# Patient Record
Sex: Male | Born: 1945 | Race: White | Hispanic: No | Marital: Married | Smoking: Former smoker
Health system: Southern US, Community
[De-identification: ages and names within clinical notes are randomized; demographics above are authoritative.]

## PROBLEM LIST (undated history)

## (undated) DIAGNOSIS — C801 Malignant (primary) neoplasm, unspecified: Secondary | ICD-10-CM

## (undated) DIAGNOSIS — K635 Polyp of colon: Secondary | ICD-10-CM

## (undated) DIAGNOSIS — E785 Hyperlipidemia, unspecified: Secondary | ICD-10-CM

## (undated) DIAGNOSIS — F32A Depression, unspecified: Secondary | ICD-10-CM

## (undated) DIAGNOSIS — K5790 Diverticulosis of intestine, part unspecified, without perforation or abscess without bleeding: Secondary | ICD-10-CM

## (undated) DIAGNOSIS — F329 Major depressive disorder, single episode, unspecified: Secondary | ICD-10-CM

## (undated) DIAGNOSIS — G4730 Sleep apnea, unspecified: Secondary | ICD-10-CM

## (undated) DIAGNOSIS — R809 Proteinuria, unspecified: Secondary | ICD-10-CM

## (undated) DIAGNOSIS — R011 Cardiac murmur, unspecified: Secondary | ICD-10-CM

## (undated) DIAGNOSIS — I10 Essential (primary) hypertension: Secondary | ICD-10-CM

## (undated) HISTORY — DX: Major depressive disorder, single episode, unspecified: F32.9

## (undated) HISTORY — DX: Depression, unspecified: F32.A

## (undated) HISTORY — DX: Cardiac murmur, unspecified: R01.1

## (undated) HISTORY — DX: Polyp of colon: K63.5

## (undated) HISTORY — DX: Diverticulosis of intestine, part unspecified, without perforation or abscess without bleeding: K57.90

## (undated) HISTORY — DX: Hyperlipidemia, unspecified: E78.5

## (undated) HISTORY — DX: Essential (primary) hypertension: I10

## (undated) HISTORY — DX: Proteinuria, unspecified: R80.9

## (undated) HISTORY — PX: TONSILLECTOMY AND ADENOIDECTOMY: SUR1326

## (undated) HISTORY — DX: Sleep apnea, unspecified: G47.30

---

## 1997-10-23 ENCOUNTER — Ambulatory Visit (HOSPITAL_COMMUNITY): Admission: RE | Admit: 1997-10-23 | Discharge: 1997-10-23 | Payer: Self-pay | Admitting: *Deleted

## 1997-11-04 ENCOUNTER — Encounter: Admission: RE | Admit: 1997-11-04 | Discharge: 1998-02-02 | Payer: Self-pay | Admitting: Internal Medicine

## 2000-05-26 ENCOUNTER — Encounter: Admission: RE | Admit: 2000-05-26 | Discharge: 2000-05-26 | Payer: Self-pay | Admitting: Internal Medicine

## 2000-05-26 ENCOUNTER — Encounter: Payer: Self-pay | Admitting: Internal Medicine

## 2000-12-04 ENCOUNTER — Emergency Department (HOSPITAL_COMMUNITY): Admission: EM | Admit: 2000-12-04 | Discharge: 2000-12-04 | Payer: Self-pay | Admitting: Emergency Medicine

## 2002-01-16 ENCOUNTER — Encounter: Admission: RE | Admit: 2002-01-16 | Discharge: 2002-01-16 | Payer: Self-pay | Admitting: Internal Medicine

## 2002-01-16 ENCOUNTER — Encounter: Payer: Self-pay | Admitting: Internal Medicine

## 2002-01-29 ENCOUNTER — Encounter: Admission: RE | Admit: 2002-01-29 | Discharge: 2002-01-29 | Payer: Self-pay | Admitting: Internal Medicine

## 2002-01-29 ENCOUNTER — Encounter: Payer: Self-pay | Admitting: Internal Medicine

## 2003-07-29 ENCOUNTER — Encounter: Payer: Self-pay | Admitting: Internal Medicine

## 2003-10-27 ENCOUNTER — Ambulatory Visit (HOSPITAL_BASED_OUTPATIENT_CLINIC_OR_DEPARTMENT_OTHER): Admission: RE | Admit: 2003-10-27 | Discharge: 2003-10-27 | Payer: Self-pay | Admitting: Internal Medicine

## 2003-12-27 ENCOUNTER — Ambulatory Visit: Payer: Self-pay | Admitting: Internal Medicine

## 2004-03-20 ENCOUNTER — Ambulatory Visit: Payer: Self-pay | Admitting: Internal Medicine

## 2004-04-06 ENCOUNTER — Ambulatory Visit: Payer: Self-pay | Admitting: Internal Medicine

## 2004-08-06 ENCOUNTER — Ambulatory Visit: Payer: Self-pay | Admitting: Internal Medicine

## 2004-08-13 ENCOUNTER — Ambulatory Visit: Payer: Self-pay | Admitting: Internal Medicine

## 2004-10-13 ENCOUNTER — Ambulatory Visit: Payer: Self-pay | Admitting: Internal Medicine

## 2004-11-12 ENCOUNTER — Ambulatory Visit: Payer: Self-pay | Admitting: Internal Medicine

## 2004-11-19 ENCOUNTER — Ambulatory Visit: Payer: Self-pay | Admitting: Internal Medicine

## 2005-03-25 ENCOUNTER — Ambulatory Visit: Payer: Self-pay | Admitting: Internal Medicine

## 2005-04-01 ENCOUNTER — Ambulatory Visit: Payer: Self-pay | Admitting: Internal Medicine

## 2005-07-26 ENCOUNTER — Ambulatory Visit: Payer: Self-pay | Admitting: Internal Medicine

## 2005-08-17 ENCOUNTER — Ambulatory Visit: Payer: Self-pay | Admitting: Internal Medicine

## 2005-11-23 ENCOUNTER — Ambulatory Visit: Payer: Self-pay | Admitting: Internal Medicine

## 2005-11-23 LAB — CONVERTED CEMR LAB
ALT: 48 units/L — ABNORMAL HIGH (ref 0–40)
AST: 35 units/L (ref 0–37)
Albumin: 3.6 g/dL (ref 3.5–5.2)
Alkaline Phosphatase: 39 units/L (ref 39–117)
BUN: 19 mg/dL (ref 6–23)
Bilirubin, Direct: 0.2 mg/dL (ref 0.0–0.3)
CO2: 33 meq/L — ABNORMAL HIGH (ref 19–32)
Calcium: 9.2 mg/dL (ref 8.4–10.5)
Chloride: 105 meq/L (ref 96–112)
Chol/HDL Ratio, serum: 4.1
Cholesterol: 135 mg/dL (ref 0–200)
Creatinine, Ser: 1.3 mg/dL (ref 0.4–1.5)
GFR calc non Af Amer: 59 mL/min
Glomerular Filtration Rate, Af Am: 72 mL/min/{1.73_m2}
Glucose, Bld: 175 mg/dL — ABNORMAL HIGH (ref 70–99)
HDL: 33 mg/dL — ABNORMAL LOW (ref >39.0)
Hgb A1c MFr Bld: 8.6 % — ABNORMAL HIGH (ref 4.6–6.0)
LDL Cholesterol: 79 mg/dL (ref 0–99)
Potassium: 4.8 meq/L (ref 3.5–5.1)
Sodium: 144 meq/L (ref 135–145)
Total Bilirubin: 1 mg/dL (ref 0.3–1.2)
Total Protein: 6.3 g/dL (ref 6.0–8.3)
Triglyceride fasting, serum: 114 mg/dL (ref 0–149)
VLDL: 23 mg/dL (ref 0–40)

## 2005-11-30 ENCOUNTER — Ambulatory Visit: Payer: Self-pay | Admitting: Internal Medicine

## 2006-01-19 ENCOUNTER — Ambulatory Visit: Payer: Self-pay | Admitting: Endocrinology

## 2006-02-09 ENCOUNTER — Ambulatory Visit: Payer: Self-pay | Admitting: Endocrinology

## 2006-02-21 ENCOUNTER — Ambulatory Visit: Payer: Self-pay | Admitting: Endocrinology

## 2006-03-17 ENCOUNTER — Ambulatory Visit: Payer: Self-pay | Admitting: Endocrinology

## 2006-04-07 ENCOUNTER — Ambulatory Visit: Payer: Self-pay | Admitting: Endocrinology

## 2006-04-22 ENCOUNTER — Ambulatory Visit: Payer: Self-pay | Admitting: Internal Medicine

## 2006-04-22 LAB — CONVERTED CEMR LAB
ALT: 107 units/L — ABNORMAL HIGH (ref 0–40)
AST: 77 units/L — ABNORMAL HIGH (ref 0–37)
Albumin: 3.4 g/dL — ABNORMAL LOW (ref 3.5–5.2)
Alkaline Phosphatase: 82 units/L (ref 39–117)
BUN: 14 mg/dL (ref 6–23)
Bilirubin, Direct: 0.1 mg/dL (ref 0.0–0.3)
CO2: 35 meq/L — ABNORMAL HIGH (ref 19–32)
Calcium: 9.2 mg/dL (ref 8.4–10.5)
Chloride: 101 meq/L (ref 96–112)
Cholesterol: 143 mg/dL (ref 0–200)
Creatinine, Ser: 1.2 mg/dL (ref 0.4–1.5)
GFR calc Af Amer: 79 mL/min
GFR calc non Af Amer: 65 mL/min
Glucose, Bld: 300 mg/dL — ABNORMAL HIGH (ref 70–99)
HDL: 37.8 mg/dL — ABNORMAL LOW (ref >39.0)
Hgb A1c MFr Bld: 10.5 % — ABNORMAL HIGH (ref 4.6–6.0)
LDL Cholesterol: 74 mg/dL (ref 0–99)
Potassium: 3.8 meq/L (ref 3.5–5.1)
Sodium: 142 meq/L (ref 135–145)
Total Bilirubin: 0.9 mg/dL (ref 0.3–1.2)
Total CHOL/HDL Ratio: 3.8
Total Protein: 6.6 g/dL (ref 6.0–8.3)
Triglycerides: 156 mg/dL — ABNORMAL HIGH (ref 0–149)
VLDL: 31 mg/dL (ref 0–40)

## 2006-04-27 ENCOUNTER — Ambulatory Visit: Payer: Self-pay | Admitting: Internal Medicine

## 2006-05-30 ENCOUNTER — Ambulatory Visit: Payer: Self-pay | Admitting: Internal Medicine

## 2006-07-28 DIAGNOSIS — E1129 Type 2 diabetes mellitus with other diabetic kidney complication: Secondary | ICD-10-CM

## 2006-07-28 DIAGNOSIS — F325 Major depressive disorder, single episode, in full remission: Secondary | ICD-10-CM | POA: Insufficient documentation

## 2006-07-28 DIAGNOSIS — E785 Hyperlipidemia, unspecified: Secondary | ICD-10-CM | POA: Insufficient documentation

## 2006-07-28 DIAGNOSIS — R809 Proteinuria, unspecified: Secondary | ICD-10-CM | POA: Insufficient documentation

## 2006-07-28 DIAGNOSIS — I10 Essential (primary) hypertension: Secondary | ICD-10-CM

## 2006-08-23 ENCOUNTER — Ambulatory Visit: Payer: Self-pay | Admitting: Internal Medicine

## 2006-08-23 LAB — CONVERTED CEMR LAB
ALT: 48 units/L (ref 0–53)
AST: 34 units/L (ref 0–37)
Albumin: 3.5 g/dL (ref 3.5–5.2)
Alkaline Phosphatase: 47 units/L (ref 39–117)
BUN: 19 mg/dL (ref 6–23)
Bilirubin, Direct: 0.1 mg/dL (ref 0.0–0.3)
CO2: 33 meq/L — ABNORMAL HIGH (ref 19–32)
Calcium: 9.3 mg/dL (ref 8.4–10.5)
Chloride: 104 meq/L (ref 96–112)
Cholesterol: 121 mg/dL (ref 0–200)
Creatinine, Ser: 1.2 mg/dL (ref 0.4–1.5)
GFR calc Af Amer: 79 mL/min
GFR calc non Af Amer: 65 mL/min
Glucose, Bld: 151 mg/dL — ABNORMAL HIGH (ref 70–99)
HDL: 39.1 mg/dL (ref >39.0)
Hgb A1c MFr Bld: 7.7 % — ABNORMAL HIGH (ref 4.6–6.0)
LDL Cholesterol: 65 mg/dL (ref 0–99)
Potassium: 3.8 meq/L (ref 3.5–5.1)
Sodium: 142 meq/L (ref 135–145)
Total Bilirubin: 0.9 mg/dL (ref 0.3–1.2)
Total CHOL/HDL Ratio: 3.1
Total Protein: 6.5 g/dL (ref 6.0–8.3)
Triglycerides: 85 mg/dL (ref 0–149)
VLDL: 17 mg/dL (ref 0–40)

## 2006-08-30 ENCOUNTER — Ambulatory Visit: Payer: Self-pay | Admitting: Internal Medicine

## 2006-08-30 LAB — CONVERTED CEMR LAB
Cholesterol, target level: 200 mg/dL
HDL goal, serum: 40 mg/dL
LDL Goal: 100 mg/dL

## 2006-12-27 ENCOUNTER — Ambulatory Visit: Payer: Self-pay | Admitting: Internal Medicine

## 2006-12-27 DIAGNOSIS — D649 Anemia, unspecified: Secondary | ICD-10-CM

## 2006-12-27 LAB — CONVERTED CEMR LAB
ALT: 67 units/L — ABNORMAL HIGH (ref 0–53)
AST: 44 units/L — ABNORMAL HIGH (ref 0–37)
Albumin: 3.4 g/dL — ABNORMAL LOW (ref 3.5–5.2)
Alkaline Phosphatase: 52 units/L (ref 39–117)
BUN: 17 mg/dL (ref 6–23)
Basophils Absolute: 0 10*3/uL (ref 0.0–0.1)
Basophils Relative: 0.1 % (ref 0.0–1.0)
Bilirubin, Direct: 0.3 mg/dL (ref 0.0–0.3)
CO2: 34 meq/L — ABNORMAL HIGH (ref 19–32)
Calcium: 9.4 mg/dL (ref 8.4–10.5)
Chloride: 99 meq/L (ref 96–112)
Cholesterol: 137 mg/dL (ref 0–200)
Creatinine, Ser: 1.3 mg/dL (ref 0.4–1.5)
Creatinine,U: 11.9 mg/dL
Eosinophils Absolute: 0.2 10*3/uL (ref 0.0–0.6)
Eosinophils Relative: 2.3 % (ref 0.0–5.0)
GFR calc Af Amer: 72 mL/min
GFR calc non Af Amer: 59 mL/min
Glucose, Bld: 126 mg/dL — ABNORMAL HIGH (ref 70–99)
HCT: 42.4 % (ref 39.0–52.0)
HDL: 35.7 mg/dL — ABNORMAL LOW (ref >39.0)
Hemoglobin: 14.5 g/dL (ref 13.0–17.0)
Hgb A1c MFr Bld: 8.6 % — ABNORMAL HIGH (ref 4.6–6.0)
LDL Cholesterol: 80 mg/dL (ref 0–99)
Lymphocytes Relative: 19.2 % (ref 12.0–46.0)
MCHC: 34.1 g/dL (ref 30.0–36.0)
MCV: 91 fL (ref 78.0–100.0)
Microalb Creat Ratio: 58.8 mg/g — ABNORMAL HIGH (ref 0.0–30.0)
Microalb, Ur: 0.7 mg/dL (ref 0.0–1.9)
Monocytes Absolute: 0.9 10*3/uL — ABNORMAL HIGH (ref 0.2–0.7)
Monocytes Relative: 10.5 % (ref 3.0–11.0)
Neutro Abs: 5.6 10*3/uL (ref 1.4–7.7)
Neutrophils Relative %: 67.9 % (ref 43.0–77.0)
Platelets: 216 10*3/uL (ref 150–400)
Potassium: 3.7 meq/L (ref 3.5–5.1)
RBC: 4.66 M/uL (ref 4.22–5.81)
RDW: 12.9 % (ref 11.5–14.6)
Sodium: 141 meq/L (ref 135–145)
Total Bilirubin: 1.2 mg/dL (ref 0.3–1.2)
Total CHOL/HDL Ratio: 3.8
Total Protein: 6.6 g/dL (ref 6.0–8.3)
Triglycerides: 105 mg/dL (ref 0–149)
VLDL: 21 mg/dL (ref 0–40)
WBC: 8.3 10*3/uL (ref 4.5–10.5)

## 2007-01-04 ENCOUNTER — Ambulatory Visit: Payer: Self-pay | Admitting: Internal Medicine

## 2007-02-20 ENCOUNTER — Telehealth: Payer: Self-pay | Admitting: Internal Medicine

## 2007-02-21 ENCOUNTER — Ambulatory Visit: Payer: Self-pay | Admitting: Internal Medicine

## 2007-03-07 ENCOUNTER — Ambulatory Visit: Payer: Self-pay | Admitting: Internal Medicine

## 2007-03-09 ENCOUNTER — Encounter: Payer: Self-pay | Admitting: Internal Medicine

## 2007-03-16 ENCOUNTER — Encounter: Payer: Self-pay | Admitting: Internal Medicine

## 2007-03-16 ENCOUNTER — Ambulatory Visit: Payer: Self-pay

## 2007-03-29 ENCOUNTER — Telehealth: Payer: Self-pay | Admitting: Internal Medicine

## 2007-04-19 ENCOUNTER — Ambulatory Visit: Payer: Self-pay | Admitting: Internal Medicine

## 2007-04-19 LAB — CONVERTED CEMR LAB
ALT: 42 U/L
AST: 31 U/L
Albumin: 3.6 g/dL
Alkaline Phosphatase: 40 U/L
BUN: 19 mg/dL
Bilirubin, Direct: 0.2 mg/dL
CO2: 33 meq/L — ABNORMAL HIGH
Calcium: 9.4 mg/dL
Chloride: 101 meq/L
Cholesterol: 114 mg/dL
Creatinine, Ser: 1.3 mg/dL
GFR calc Af Amer: 72 mL/min
GFR calc non Af Amer: 59 mL/min
Glucose, Bld: 167 mg/dL — ABNORMAL HIGH
HDL: 29.8 mg/dL — ABNORMAL LOW
Hgb A1c MFr Bld: 8.8 % — ABNORMAL HIGH
LDL Cholesterol: 64 mg/dL
Potassium: 4.1 meq/L
Sodium: 140 meq/L
Total Bilirubin: 1.1 mg/dL
Total CHOL/HDL Ratio: 3.8
Total Protein: 6.4 g/dL
Triglycerides: 100 mg/dL
VLDL: 20 mg/dL

## 2007-05-05 ENCOUNTER — Ambulatory Visit: Payer: Self-pay | Admitting: Internal Medicine

## 2007-06-16 ENCOUNTER — Encounter: Payer: Self-pay | Admitting: Internal Medicine

## 2007-06-19 ENCOUNTER — Telehealth: Payer: Self-pay | Admitting: Internal Medicine

## 2007-06-28 ENCOUNTER — Ambulatory Visit: Payer: Self-pay | Admitting: Internal Medicine

## 2007-06-28 DIAGNOSIS — M109 Gout, unspecified: Secondary | ICD-10-CM

## 2007-07-20 ENCOUNTER — Telehealth: Payer: Self-pay | Admitting: Internal Medicine

## 2007-08-30 ENCOUNTER — Ambulatory Visit: Payer: Self-pay | Admitting: Internal Medicine

## 2007-08-30 LAB — CONVERTED CEMR LAB
ALT: 50 units/L (ref 0–53)
AST: 50 units/L — ABNORMAL HIGH (ref 0–37)
Bilirubin, Direct: 0.2 mg/dL (ref 0.0–0.3)
CO2: 30 meq/L (ref 19–32)
Calcium: 9.3 mg/dL (ref 8.4–10.5)
Chloride: 104 meq/L (ref 96–112)
Creatinine, Ser: 1.3 mg/dL (ref 0.4–1.5)
Glucose, Bld: 119 mg/dL — ABNORMAL HIGH (ref 70–99)
HDL: 34.5 mg/dL — ABNORMAL LOW (ref >39.0)
Total Bilirubin: 1.2 mg/dL (ref 0.3–1.2)
Total CHOL/HDL Ratio: 3.4
Total Protein: 6.6 g/dL (ref 6.0–8.3)
Triglycerides: 95 mg/dL (ref 0–149)

## 2007-09-06 ENCOUNTER — Ambulatory Visit: Payer: Self-pay | Admitting: Internal Medicine

## 2007-10-16 ENCOUNTER — Telehealth: Payer: Self-pay | Admitting: Internal Medicine

## 2007-12-22 ENCOUNTER — Ambulatory Visit: Payer: Self-pay | Admitting: Internal Medicine

## 2007-12-22 LAB — CONVERTED CEMR LAB
AST: 31 units/L (ref 0–37)
Alkaline Phosphatase: 52 units/L (ref 39–117)
Bilirubin, Direct: 0.1 mg/dL (ref 0.0–0.3)
Chloride: 101 meq/L (ref 96–112)
GFR calc Af Amer: 78 mL/min
GFR calc non Af Amer: 65 mL/min
LDL Cholesterol: 68 mg/dL (ref 0–99)
Potassium: 3.6 meq/L (ref 3.5–5.1)
Sodium: 141 meq/L (ref 135–145)
Total Bilirubin: 1.1 mg/dL (ref 0.3–1.2)
VLDL: 21 mg/dL (ref 0–40)

## 2007-12-24 ENCOUNTER — Emergency Department (HOSPITAL_COMMUNITY): Admission: EM | Admit: 2007-12-24 | Discharge: 2007-12-24 | Payer: Self-pay | Admitting: Emergency Medicine

## 2007-12-28 ENCOUNTER — Ambulatory Visit: Payer: Self-pay | Admitting: Internal Medicine

## 2008-02-26 ENCOUNTER — Encounter: Payer: Self-pay | Admitting: Internal Medicine

## 2008-04-08 ENCOUNTER — Encounter: Payer: Self-pay | Admitting: Internal Medicine

## 2008-04-30 ENCOUNTER — Ambulatory Visit: Payer: Self-pay | Admitting: Internal Medicine

## 2008-04-30 LAB — CONVERTED CEMR LAB
ALT: 60 units/L — ABNORMAL HIGH (ref 0–53)
Alkaline Phosphatase: 43 units/L (ref 39–117)
BUN: 16 mg/dL (ref 6–23)
Bilirubin, Direct: 0.1 mg/dL (ref 0.0–0.3)
Creatinine, Ser: 1.3 mg/dL (ref 0.4–1.5)
GFR calc non Af Amer: 58.93 mL/min (ref >60)
Glucose, Bld: 299 mg/dL — ABNORMAL HIGH (ref 70–99)
HDL: 41.4 mg/dL (ref >39.00)
Hgb A1c MFr Bld: 11.6 % — ABNORMAL HIGH (ref 4.6–6.5)
LDL Cholesterol: 67 mg/dL (ref 0–99)
Total CHOL/HDL Ratio: 3
Total Protein: 6.7 g/dL (ref 6.0–8.3)
Triglycerides: 159 mg/dL — ABNORMAL HIGH (ref 0.0–149.0)
VLDL: 31.8 mg/dL (ref 0.0–40.0)

## 2008-05-07 ENCOUNTER — Ambulatory Visit: Payer: Self-pay | Admitting: Internal Medicine

## 2008-09-05 ENCOUNTER — Ambulatory Visit: Payer: Self-pay | Admitting: Internal Medicine

## 2008-09-05 LAB — CONVERTED CEMR LAB
ALT: 55 units/L — ABNORMAL HIGH (ref 0–53)
AST: 37 units/L (ref 0–37)
BUN: 16 mg/dL (ref 6–23)
Bilirubin, Direct: 0.1 mg/dL (ref 0.0–0.3)
CO2: 33 meq/L — ABNORMAL HIGH (ref 19–32)
GFR calc non Af Amer: 58.87 mL/min (ref >60)
Glucose, Bld: 348 mg/dL — ABNORMAL HIGH (ref 70–99)
Potassium: 3.8 meq/L (ref 3.5–5.1)
Total Bilirubin: 1 mg/dL (ref 0.3–1.2)
Total CHOL/HDL Ratio: 3
VLDL: 22.8 mg/dL (ref 0.0–40.0)

## 2008-09-11 ENCOUNTER — Ambulatory Visit: Payer: Self-pay | Admitting: Internal Medicine

## 2008-09-17 ENCOUNTER — Encounter: Payer: Self-pay | Admitting: Internal Medicine

## 2009-01-01 ENCOUNTER — Ambulatory Visit: Payer: Self-pay | Admitting: Internal Medicine

## 2009-01-01 LAB — CONVERTED CEMR LAB
Alkaline Phosphatase: 49 units/L (ref 39–117)
Bilirubin, Direct: 0 mg/dL (ref 0.0–0.3)
CO2: 32 meq/L (ref 19–32)
Calcium: 9.7 mg/dL (ref 8.4–10.5)
Creatinine, Ser: 1.3 mg/dL (ref 0.4–1.5)
GFR calc non Af Amer: 58.81 mL/min (ref >60)
HDL: 39.2 mg/dL (ref >39.00)
LDL Cholesterol: 64 mg/dL (ref 0–99)
Sodium: 139 meq/L (ref 135–145)
Total Bilirubin: 1.2 mg/dL (ref 0.3–1.2)
Total CHOL/HDL Ratio: 3
Triglycerides: 163 mg/dL — ABNORMAL HIGH (ref 0.0–149.0)

## 2009-01-08 ENCOUNTER — Ambulatory Visit: Payer: Self-pay | Admitting: Internal Medicine

## 2009-01-08 LAB — HM DIABETES FOOT EXAM

## 2009-03-07 ENCOUNTER — Telehealth: Payer: Self-pay | Admitting: Internal Medicine

## 2009-03-27 ENCOUNTER — Ambulatory Visit: Payer: Self-pay | Admitting: Internal Medicine

## 2009-04-09 ENCOUNTER — Telehealth: Payer: Self-pay | Admitting: *Deleted

## 2009-04-23 ENCOUNTER — Encounter: Payer: Self-pay | Admitting: Internal Medicine

## 2009-04-24 ENCOUNTER — Ambulatory Visit: Payer: Self-pay | Admitting: Internal Medicine

## 2009-04-24 LAB — CONVERTED CEMR LAB
Albumin: 3.8 g/dL (ref 3.5–5.2)
Alkaline Phosphatase: 50 units/L (ref 39–117)
CO2: 34 meq/L — ABNORMAL HIGH (ref 19–32)
Chloride: 102 meq/L (ref 96–112)
Cholesterol: 135 mg/dL (ref 0–200)
Creatinine, Ser: 1.4 mg/dL (ref 0.4–1.5)
HDL: 43.6 mg/dL (ref >39.00)
Hgb A1c MFr Bld: 9.5 % — ABNORMAL HIGH (ref 4.6–6.5)
LDL Cholesterol: 62 mg/dL (ref 0–99)
Sodium: 144 meq/L (ref 135–145)
Total CHOL/HDL Ratio: 3
Total Protein: 7.1 g/dL (ref 6.0–8.3)
Triglycerides: 146 mg/dL (ref 0.0–149.0)
VLDL: 29.2 mg/dL (ref 0.0–40.0)

## 2009-05-02 ENCOUNTER — Ambulatory Visit: Payer: Self-pay | Admitting: Internal Medicine

## 2009-08-29 ENCOUNTER — Ambulatory Visit: Payer: Self-pay | Admitting: Internal Medicine

## 2009-08-29 LAB — CONVERTED CEMR LAB
ALT: 47 units/L (ref 0–53)
Albumin: 3.7 g/dL (ref 3.5–5.2)
Alkaline Phosphatase: 53 units/L (ref 39–117)
BUN: 18 mg/dL (ref 6–23)
Bilirubin, Direct: 0.2 mg/dL (ref 0.0–0.3)
Cholesterol: 126 mg/dL (ref 0–200)
Creatinine, Ser: 1.1 mg/dL (ref 0.4–1.5)
GFR calc non Af Amer: 69.71 mL/min (ref >60)
Hgb A1c MFr Bld: 10.6 % — ABNORMAL HIGH (ref 4.6–6.5)
LDL Cholesterol: 66 mg/dL (ref 0–99)
Potassium: 3.4 meq/L — ABNORMAL LOW (ref 3.5–5.1)
Total Protein: 6.3 g/dL (ref 6.0–8.3)
Triglycerides: 128 mg/dL (ref 0.0–149.0)
VLDL: 25.6 mg/dL (ref 0.0–40.0)

## 2009-09-04 ENCOUNTER — Ambulatory Visit: Payer: Self-pay | Admitting: Internal Medicine

## 2009-09-04 DIAGNOSIS — N529 Male erectile dysfunction, unspecified: Secondary | ICD-10-CM

## 2009-09-05 ENCOUNTER — Telehealth: Payer: Self-pay | Admitting: Internal Medicine

## 2009-09-25 ENCOUNTER — Encounter (INDEPENDENT_AMBULATORY_CARE_PROVIDER_SITE_OTHER): Payer: Self-pay | Admitting: *Deleted

## 2009-10-08 ENCOUNTER — Telehealth: Payer: Self-pay | Admitting: Internal Medicine

## 2009-10-15 ENCOUNTER — Telehealth: Payer: Self-pay | Admitting: Internal Medicine

## 2009-10-16 ENCOUNTER — Encounter: Payer: Self-pay | Admitting: Internal Medicine

## 2009-11-13 ENCOUNTER — Encounter: Payer: Self-pay | Admitting: Internal Medicine

## 2009-11-21 ENCOUNTER — Encounter (INDEPENDENT_AMBULATORY_CARE_PROVIDER_SITE_OTHER): Payer: Self-pay | Admitting: *Deleted

## 2009-11-26 ENCOUNTER — Ambulatory Visit: Payer: Self-pay | Admitting: Gastroenterology

## 2009-12-08 ENCOUNTER — Telehealth (INDEPENDENT_AMBULATORY_CARE_PROVIDER_SITE_OTHER): Payer: Self-pay | Admitting: *Deleted

## 2009-12-09 ENCOUNTER — Ambulatory Visit: Payer: Self-pay | Admitting: Gastroenterology

## 2009-12-09 DIAGNOSIS — K635 Polyp of colon: Secondary | ICD-10-CM

## 2009-12-09 DIAGNOSIS — K5790 Diverticulosis of intestine, part unspecified, without perforation or abscess without bleeding: Secondary | ICD-10-CM

## 2009-12-09 HISTORY — DX: Polyp of colon: K63.5

## 2009-12-09 HISTORY — DX: Diverticulosis of intestine, part unspecified, without perforation or abscess without bleeding: K57.90

## 2009-12-15 ENCOUNTER — Encounter: Payer: Self-pay | Admitting: Gastroenterology

## 2009-12-18 LAB — HM DIABETES EYE EXAM: HM Diabetic Eye Exam: NORMAL

## 2009-12-30 ENCOUNTER — Ambulatory Visit: Payer: Self-pay | Admitting: Internal Medicine

## 2009-12-30 LAB — CONVERTED CEMR LAB
AST: 37 units/L (ref 0–37)
Albumin: 3.8 g/dL (ref 3.5–5.2)
Alkaline Phosphatase: 55 units/L (ref 39–117)
CO2: 33 meq/L — ABNORMAL HIGH (ref 19–32)
Calcium: 9.5 mg/dL (ref 8.4–10.5)
Cholesterol: 145 mg/dL (ref 0–200)
Creatinine, Ser: 1.2 mg/dL (ref 0.4–1.5)
Total CHOL/HDL Ratio: 4
Triglycerides: 140 mg/dL (ref 0.0–149.0)

## 2010-01-05 ENCOUNTER — Ambulatory Visit: Payer: Self-pay | Admitting: Internal Medicine

## 2010-01-22 ENCOUNTER — Ambulatory Visit
Admission: RE | Admit: 2010-01-22 | Discharge: 2010-01-22 | Payer: Self-pay | Source: Home / Self Care | Attending: Family Medicine | Admitting: Family Medicine

## 2010-02-17 NOTE — Progress Notes (Signed)
Summary: Refill--Levemir  Phone Note Refill Request Message from:  Fax from Pharmacy on October 15, 2009 1:16 PM  Refills Requested: Medication #1:  LEVEMIR FLEXPEN 100 UNIT/ML SOLN 55 units subcutaneously two times a day [facility]--Will fax new prescription with current dosage  Initial call taken by: Lucious Groves CMA,  October 15, 2009 1:16 PM    Prescriptions: LEVEMIR FLEXPEN 100 UNIT/ML SOLN (INSULIN DETEMIR) 55 units subcutaneously two times a day  #3 mo. supply x 2   Entered by:   Lucious Groves CMA   Authorized by:   Birdie Sons MD   Signed by:   Lucious Groves CMA on 10/15/2009   Method used:   Faxed to ...       [facility] ([facility]       [address] [place], [place]  [address]       Ph: [phone]       Fax: [phone]   RxID:   [id number]

## 2010-02-17 NOTE — Assessment & Plan Note (Signed)
Summary: 4 month fup//ccm/pt rescd from bump//ccm   Vital Signs:  Patient profile:   65 year old male Weight:      208 pounds BMI:     36.40 Temp:     98 degrees F oral Pulse rate:   64 / minute Pulse rhythm:   regular Resp:     12 per minute BP sitting:   108 / 76  (left arm) Cuff size:   regular  Vitals Entered By: Gladis Riffle, RN (September 04, 2009 8:24 AM) CC: 4 month rov, labs done--CBGs 165-185 at home Is Patient Diabetic? Yes Did you bring your meter with you today? No   CC:  4 month rov and labs done--CBGs 165-185 at home.  History of Present Illness:  Follow-Up Visit      This is a 65 year old man who presents for Follow-up visit.  The patient denies chest pain and palpitations.  Since the last visit the patient notes no new problems or concerns except has noted lack of ejaculation over past 3 months---erections are adequate and he is able to reach orgasm.  The patient reports taking meds as prescribed.  When questioned about possible medication side effects, the patient notes none.   not following a diet or exercise plan All other systems reviewed and were negative   Preventive Screening-Counseling & Management  Alcohol-Tobacco     Smoking Status: quit     Year Quit: 2000  Current Problems (verified): 1)  Edema  (ICD-782.3) 2)  Obesity  (ICD-278.00) 3)  Gout  (ICD-274.9) 4)  Anemia  (ICD-285.9) 5)  Advef, Drug/medicinal/biological Subst Nos  (ICD-995.20) 6)  Microalbuminuria  (ICD-791.0) 7)  Hypertension  (ICD-401.9) 8)  Hyperlipidemia  (ICD-272.4) 9)  Diabetes Mellitus, Type II  (ICD-250.00) 10)  Depression  (ICD-311)  Current Medications (verified): 1)  Bd U/f Iii Short Pen Needle 31g X 8 Mm Misc (Insulin Pen Needle) .... Use Once Daily 2)  Bumetanide 2 Mg Tabs (Bumetanide) .... One Daily 3)  Labetalol Hcl 200 Mg  Tabs (Labetalol Hcl) .... 2 By Mouth Two Times A Day 4)  Levemir Flexpen 100 Unit/ml Soln (Insulin Detemir) .... 50 Units Subcutaneously Two  Times A Day 5)  Lipitor 20 Mg Tabs (Atorvastatin Calcium) .... Take 1 Tablet By Mouth Once A Day 6)  Metformin Hcl 850 Mg Tabs (Metformin Hcl) .... Take 1 Tablet By Mouth Twice A Day 7)  Paxil 20 Mg  Tabs (Paroxetine Hcl) .... One Q Day 8)  Freestyle Lite Test  Strp (Glucose Blood) .... Use Once Daily As Directed 9)  Freestyle Lancets   Misc (Lancets) .... Use Once Daily 10)  Ramipril 10 Mg  Caps (Ramipril) .... One By Mouth Daily 11)  Benicar Hct 40-12.5 Mg Tabs (Olmesartan Medoxomil-Hctz) .... One By Mouth Daily 12)  Amlodipine Besylate 10 Mg  Tabs (Amlodipine Besylate) .... 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   Head:  normocephalic and atraumatic.   Eyes:  pupils equal and pupils round.   Ears:  R ear normal and L ear normal.   Neck:  No deformities, masses, or tenderness noted. Chest Wall:  No deformities, masses, tenderness or gynecomastia noted. Lungs:  normal respiratory effort and no accessory muscle use.   Heart:  normal rate and regular rhythm.   Abdomen:  Bowel sounds positive,abdomen soft and non-tender without masses, organomegaly or hernias noted. Msk:  No deformity or scoliosis noted of thoracic or lumbar spine.   Neurologic:  cranial nerves II-XII intact and gait normal.   Skin:  turgor  normal and color normal.   Psych:  normally interactive and good eye contact.     Impression & Recommendations:  Problem # 1:  DIABETES MELLITUS, TYPE II (ICD-250.00) not controlled continue current medications except increase levemir he will monitor Blood sugars at various times of day His updated medication list for this problem includes:    Levemir Flexpen 100 Unit/ml Soln (Insulin detemir) .Marland KitchenMarland KitchenMarland KitchenMarland Kitchen 55 units subcutaneously two times a day    Metformin Hcl 850 Mg Tabs (Metformin hcl) .Marland Kitchen... Take 1 tablet by mouth twice a day    Ramipril 10 Mg Caps (Ramipril) ..... One by mouth daily    Benicar Hct 40-12.5 Mg Tabs (Olmesartan medoxomil-hctz) ..... One by mouth daily  Labs Reviewed: Creat: 1.1 (08/29/2009)     Last Eye Exam: normal-pt's report (12/18/2008) Reviewed HgBA1c results: 10.6 (08/29/2009)  9.5 (04/24/2009)  Problem # 2:  OBESITY (ICD-278.00) clearly his main problem needs to lose weight  Problem # 3:  HYPERLIPIDEMIA (ICD-272.4) controlled continue current medications  His updated medication list for this problem includes:    Lipitor 20 Mg Tabs (Atorvastatin calcium) .Marland Kitchen... Take 1 tablet by mouth once a day  Labs Reviewed: SGOT: 33 (08/29/2009)   SGPT: 47 (08/29/2009)  Lipid Goals: Chol Goal: 200 (08/30/2006)   HDL Goal: 40 (08/30/2006)   LDL Goal: 100 (08/30/2006)   TG Goal: 150 (08/30/2006)  Prior 10 Yr Risk Heart Disease: 11 % (03/07/2007)   HDL:34.10 (08/29/2009), 43.60 (04/24/2009)  LDL:66 (08/29/2009), 62 (04/24/2009)  Chol:126 (08/29/2009), 135 (04/24/2009)  Trig:128.0 (08/29/2009), 146.0 (04/24/2009)  Problem # 4:  EDEMA (ICD-782.3) resolved His updated medication list for this problem includes:    Bumetanide 2 Mg Tabs (Bumetanide) ..... One daily    Benicar Hct 40-12.5 Mg Tabs (Olmesartan medoxomil-hctz) ..... One by mouth daily  Problem # 5:  HYPERTENSION (ICD-401.9)  note ACE-I and ARB  BP well controlled continue current medications  His updated  medication list for this problem includes:    Bumetanide 2 Mg Tabs (Bumetanide) ..... One daily    Labetalol Hcl 200 Mg Tabs (Labetalol hcl) .Marland Kitchen... 2 by mouth two times a day    Ramipril 10 Mg Caps (Ramipril) ..... One by mouth daily    Benicar Hct 40-12.5 Mg Tabs (Olmesartan medoxomil-hctz) ..... One by mouth daily    Amlodipine Besylate 10 Mg Tabs (Amlodipine besylate) ..... 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 Virginia Rochester  Complete Medication List: 1)  Bd U/f Iii Short Pen Needle 31g X 8 Mm Misc (Insulin pen needle) .Marland KitchenMarland KitchenMarland Kitchen  Use once daily 2)  Bumetanide 2 Mg Tabs (Bumetanide) .... One daily 3)  Labetalol Hcl 200 Mg Tabs (Labetalol hcl) .... 2 by mouth two times a day 4)  Levemir Flexpen 100 Unit/ml Soln (Insulin detemir) .... 55 units subcutaneously two times a day 5)  Lipitor 20 Mg Tabs (Atorvastatin calcium) .... Take 1 tablet by mouth once a day 6)  Metformin Hcl 850 Mg Tabs (Metformin hcl) .... Take 1 tablet by mouth twice a day 7)  Paxil 20 Mg Tabs (Paroxetine hcl) .... One q day 8)  Freestyle Lite Test Strp (Glucose blood) .... Use once daily as directed 9)  Freestyle Lancets Misc (Lancets) .... Use once daily 10)  Ramipril 10 Mg Caps (Ramipril) .... One by mouth daily 11)  Benicar Hct 40-12.5 Mg Tabs (Olmesartan medoxomil-hctz) .... One by mouth daily 12)  Amlodipine Besylate 10 Mg Tabs (Amlodipine besylate) .... Once daily   Patient Instructions: 1)  Please schedule a follow-up appointment in 4 months. 2)  labs one week prior to visit 3)   lipids---272.4 4)  lfts-995.2 5)  bmet-995.2 6)  A1C-250.02 7)

## 2010-02-17 NOTE — Progress Notes (Signed)
Summary: Refill Labetalol  Phone Note From Pharmacy   Reason for Call: Medication not on formulary    Prescriptions: LABETALOL HCL 200 MG  TABS (LABETALOL HCL) 2 by mouth two times a day  #200 x 1   Entered by:   Kathrynn Speed CMA   Authorized by:   Birdie Sons MD   Signed by:   Kathrynn Speed CMA on 10/08/2009   Method used:   Faxed to ...       [facility])       [address] [place], [place]  [address]       Ph: [phone]       Fax: [phone]   RxID:   [id number]

## 2010-02-17 NOTE — Letter (Signed)
Summary: Diabetic Instructions  [facility]  [address] [place], [place] [address]   Phone: [phone]  Fax: [phone]    Paul [name] 11/15/1945 MRN: [id number]   _xx _   ORAL DIABETIC MEDICATION INSTRUCTIONS  The day before your procedure:   Take your diabetic pill as you do normally  The day of your procedure:   Do not take your diabetic pill    We will check your blood sugar levels during the admission process and again in Recovery before discharging you home  ________________________________________________________________________  _x  _   INSULIN (LONG ACTING) MEDICATION INSTRUCTIONS (Lantus, NPH, 70/30, Humulin, Novolin-N)   The day before your procedure:   Take  your regular evening dose    The day of your procedure:   Do not take your morning dose

## 2010-02-17 NOTE — Progress Notes (Signed)
Summary: Pt checking on status of Benicar refill from [facility]  Phone Note Call from Patient Call back at Work Phone [phone]   Caller: Patient Summary of Call: Pt says that [facility] sent a req to get Benicar renewed. Pt is checking on status. Initial call taken by: Lucy Antigua,  March 07, 2009 9:35 AM  Follow-up for Phone Call        [facility] Follow-up by: Kern Reap CMA Duncan Dull),  March 07, 2009 9:49 AM  Additional Follow-up for Phone Call Additional follow up Details #1::        I called pt and notified him that refill has been faxed to [facility], as noted above.  Additional Follow-up by: Lucy Antigua,  March 07, 2009 10:15 AM    Prescriptions: BENICAR HCT 40-12.5 MG TABS (OLMESARTAN MEDOXOMIL-HCTZ) one by mouth daily  #90 x 3   Entered by:   Kern Reap CMA (AAMA)   Authorized by:   Birdie Sons MD   Signed by:   Kern Reap CMA (AAMA) on 03/07/2009   Method used:   Faxed to ...       [facility] ([facility]       [address] [place], [place]  [address]       Ph: [phone]       Fax: [phone]   RxID:   [id number]

## 2010-02-17 NOTE — Miscellaneous (Signed)
Summary: Orders Update   Clinical Lists Changes  Problems: Added new problem of HEALTH SCREENING (ICD-V70.0) Orders: Added new Referral order of Gastroenterology Referral (GI) - Signed

## 2010-02-17 NOTE — Progress Notes (Signed)
Summary: Pt called and is req to get a colonoscopy done  Phone Note Call from Patient   Caller: Patient Summary of Call: Pt called and is req to a colonoscopy done. Pls call pt at work # [phone], with referral info.   Initial call taken by: Lucy Antigua,  September 05, 2009 4:17 PM  Follow-up for Phone Call        order in process since 09/04/09.  GI to set up. Follow-up by: Gladis Riffle, RN,  September 08, 2009 9:09 AM  Additional Follow-up for Phone Call Additional follow up Details #1::        Patient notified. Will await when and where of appt. Additional Follow-up by: Gladis Riffle, RN,  September 08, 2009 2:50 PM

## 2010-02-17 NOTE — Procedures (Signed)
Summary: Colonoscopy  Patient: Paul [name] Note: All result statuses are Final unless otherwise noted.  Tests: (1) Colonoscopy (COL)   COL Colonoscopy           DONE     [facility]     [address]     [place], [place]  [address]           COLONOSCOPY PROCEDURE REPORT           PATIENT:  Paul [name], Paul [name]  MR#:  [id number]     BIRTHDATE:  Feb 25, 1945, 64 yrs. old  GENDER:  male           ENDOSCOPIST:  Barbette Hair. Arlyce Dice, MD     Referred by:  Birdie Sons, M.D.           PROCEDURE DATE:  12/09/2009     PROCEDURE:  Colonoscopy with snare polypectomy, Colon with cold     biopsy polypectomy     ASA CLASS:  Class II     INDICATIONS:  1) Routine Risk Screening           MEDICATIONS:   Fentanyl 50 mcg IV, Versed 5 mg IV           DESCRIPTION OF PROCEDURE:   After the risks benefits and     alternatives of the procedure were thoroughly explained, informed     consent was obtained.  Digital rectal exam was performed and     revealed no abnormalities.   The [id number] endoscope was     introduced through the anus and advanced to the cecum, which was     identified by both the appendix and ileocecal valve, without     limitations.  The quality of the prep was excellent, using     MoviPrep.  The instrument was then slowly withdrawn as the colon     was fully examined.     <<PROCEDUREIMAGES>>           FINDINGS:  There were multiple polyps identified and removed. in     the descending colon (see image12 and image11). 4 2-68mm sessile     polyps mid-descending colon - removed with cold bx forcepts (2) or     cold polypectomy snare (2)  Moderate diverticulosis was found in     the sigmoid colon (see image2).  This was otherwise a normal     examination of the colon (see image3, image6, image8, image16, and     image17).   Retroflexed views in the rectum revealed no     abnormalities.    The time to cecum =  2.0  minutes. The scope was     then withdrawn (time =  12.25  min)  from the patient and the     procedure completed.           COMPLICATIONS:  None           ENDOSCOPIC IMPRESSION:     1) Polyps, multiple in the descending colon     2) Moderate diverticulosis in the sigmoid colon     3) Otherwise normal examination     RECOMMENDATIONS:     1) If the polyp(s) removed today are proven to be adenomatous     (pre-cancerous) polyps, you will need a repeat colonoscopy in 5     years. Otherwise you should continue to follow colorectal cancer     screening guidelines for "routine risk" patients with colonoscopy     in  10 years.           REPEAT EXAM:   You will receive a letter from Dr. Arlyce Dice in 1-2     weeks, after reviewing the final pathology, with followup     recommendations.           ______________________________     Barbette Hair Arlyce Dice, MD           CC:           n.     eSIGNED:   Barbette Hair. Ambert Virrueta at 12/09/2009 11:50 AM           Al Pimple, [id number]  Note: An exclamation mark (!) indicates a result that was not dispersed into the flowsheet. Document Creation Date: 12/09/2009 11:50 AM _______________________________________________________________________  (1) Order result status: Final Collection or observation date-time: 12/09/2009 11:39 Requested date-time:  Receipt date-time:  Reported date-time:  Referring Physician:   Ordering Physician: Melvia Heaps [phone]) Specimen Source:  Source: Launa Grill Order Number: [id number] Lab site:   Appended Document: Colonoscopy    Clinical Lists Changes  Observations: Added new observation of COLONNXTDUE: 11/19/2014 (12/15/2009 11:01)

## 2010-02-17 NOTE — Letter (Signed)
Summary: [facility] Instructions  [facility]  [address] [place], [place] [address]   Phone: [phone]  Fax: [phone]       Paul [name]    09-04-1945    MRN: [id number]        Procedure Day /Date: Tuesday 12-09-09     Arrival Time: 9:30 a.m.     Procedure Time: 10:30 a.m.     Location of Procedure:                    _x _  [facility] (4th Floor)   PREPARATION FOR COLONOSCOPY WITH MOVIPREP   Starting 5 days prior to your procedure  12-04-09  do not eat nuts, seeds, popcorn, corn, beans, peas,  salads, or any raw vegetables.  Do not take any fiber supplements (e.g. Metamucil, Citrucel, and Benefiber).  THE DAY BEFORE YOUR PROCEDURE         DATE:  12-08-09  DAY:  Monday  1.  Drink clear liquids the entire day-NO SOLID FOOD  2.  Do not drink anything colored red or purple.  Avoid juices with pulp.  No orange juice.  3.  Drink at least 64 oz. (8 glasses) of fluid/clear liquids during the day to prevent dehydration and help the prep work efficiently.  CLEAR LIQUIDS INCLUDE: Water Jello Ice Popsicles Tea (sugar ok, no milk/cream) Powdered fruit flavored drinks Coffee (sugar ok, no milk/cream) Gatorade Juice: apple, white grape, white cranberry  Lemonade Clear bullion, consomm, broth Carbonated beverages (any kind) Strained chicken noodle soup Hard Candy                             4.  In the morning, mix first dose of MoviPrep solution:    Empty 1 Pouch A and 1 Pouch B into the disposable container    Add lukewarm drinking water to the top line of the container. Mix to dissolve    Refrigerate (mixed solution should be used within 24 hrs)  5.  Begin drinking the prep at 5:00 p.m. The MoviPrep container is divided by 4 marks.   Every 15 minutes drink the solution down to the next mark (approximately 8 oz) until the full liter is complete.   6.  Follow completed prep with 16 oz of clear liquid of your choice (Nothing red or  purple).  Continue to drink clear liquids until bedtime.  7.  Before going to bed, mix second dose of MoviPrep solution:    Empty 1 Pouch A and 1 Pouch B into the disposable container    Add lukewarm drinking water to the top line of the container. Mix to dissolve    Refrigerate  THE DAY OF YOUR PROCEDURE      DATE:  12-09-09  DAY:  Tuesday  Beginning at  5:30 a.m. (5 hours before procedure):         1. Every 15 minutes, drink the solution down to the next mark (approx 8 oz) until the full liter is complete.  2. Follow completed prep with 16 oz. of clear liquid of your choice.    3. You may drink clear liquids until  8:30 a.m. (2 HOURS BEFORE PROCEDURE).   MEDICATION INSTRUCTIONS  Unless otherwise instructed, you should take regular prescription medications with a small sip of water   as early as possible the morning of your procedure.  Additional medication instructions: Hold Benicar and Bumetinide the morning  of procedure.  Take other prescription medicine that morning.         OTHER INSTRUCTIONS  You will need a responsible adult at least 67 years of age to accompany you and drive you home.   This person must remain in the waiting room during your procedure.  Wear loose fitting clothing that is easily removed.  Leave jewelry and other valuables at home.  However, you may wish to bring a book to read or  an iPod/MP3 player to listen to music as you wait for your procedure to start.  Remove all body piercing jewelry and leave at home.  Total time from sign-in until discharge is approximately 2-3 hours.  You should go home directly after your procedure and rest.  You can resume normal activities the  day after your procedure.  The day of your procedure you should not:   Drive   Make legal decisions   Operate machinery   Drink alcohol   Return to work  You will receive specific instructions about eating, activities and medications before you  leave.    The above instructions have been reviewed and explained to me by   Wyona Almas RN  November 26, 2009 8:32 AM     I fully understand and can verbalize these instructions _____________________________ Date _________

## 2010-02-17 NOTE — Progress Notes (Signed)
Summary: REFILL  Phone Note Refill Request Message from:  Fax from Pharmacy  Refills Requested: Medication #1:  LIPITOR 20 MG TABS Take 1 tablet by mouth once a day [facility]---[phone]  Initial call taken by: Warnell Forester,  April 09, 2009 9:09 AM    Prescriptions: LIPITOR 20 MG TABS (ATORVASTATIN CALCIUM) Take 1 tablet by mouth once a day  #90 x 3   Entered by:   Kern Reap CMA (AAMA)   Authorized by:   Birdie Sons MD   Signed by:   Kern Reap CMA (AAMA) on 04/09/2009   Method used:   Faxed to ...       [facility] (mail[facility])       [address] [place], [place]  [address]       Ph: [phone]       Fax: [phone]   RxID:   [id number]

## 2010-02-17 NOTE — Miscellaneous (Signed)
Summary: [facility] PREVISIT/PREP  Clinical Lists Changes  Medications: Added new medication of MOVIPREP 100 GM  SOLR (PEG-KCL-NACL-NASULF-NA ASC-C) As per prep instructions. - Signed Rx of MOVIPREP 100 GM  SOLR (PEG-KCL-NACL-NASULF-NA ASC-C) As per prep instructions.;  #1 x 0;  Signed;  Entered by: Wyona Almas RN;  Authorized by: Louis Meckel MD;  Method used: [facility] to [facility]  [phone]*, [address], [place], [place]  [address], Ph: [phone] or [phone], Fax: [phone] Observations: Added new observation of NKA: T (11/26/2009 8:06)    Prescriptions: MOVIPREP 100 GM  SOLR (PEG-KCL-NACL-NASULF-NA ASC-C) As per prep instructions.  #1 x 0   Entered by:   Wyona Almas RN   Authorized by:   Louis Meckel MD   Signed by:   Wyona Almas RN on 11/26/2009   Method used:   Electronically to        [name]  [phone]* (retail)       [address] [place], [place]  [address]       Ph: [phone] or [phone]       Fax: [phone]   RxID:   [id number]

## 2010-02-17 NOTE — Assessment & Plan Note (Signed)
Summary: 4 MONTH ROV/NJR rsc bmp/njr   Vital Signs:  Patient profile:   65 year old male Weight:      211 pounds Temp:     97.9 degrees F oral Pulse rate:   64 / minute Pulse rhythm:   regular Resp:     12 per minute BP sitting:   106 / 70  (left arm)  Vitals Entered By: Gladis Riffle, RN (May 02, 2009 7:54 AM) CC: 4 month rov, labs done--discuss amlodipine--CBGs AM 180-220,PM lower at home Is Patient Diabetic? Yes Did you bring your meter with you today? No   CC:  4 month rov, labs done--discuss amlodipine--CBGs AM 180-220, and PM lower at home.  History of Present Illness: last visit:  pt was confused about meds and was taking 5 amlodipine daily---likely the cause of swelling. He realized the mistake last week and noted markedly elevated BP---now back on amlodipine  Follow-Up Visit      This is a 65 year old man who presents for Follow-up visit.  The patient denies chest pain and palpitations.  Since the last visit the patient notes no new problems or concerns.  The patient reports monitoring BP, monitoring blood sugars, and dietary noncompliance.  When questioned about possible medication side effects, the patient notes none.    All other systems reviewed and were negative   Preventive Screening-Counseling & Management  Alcohol-Tobacco     Smoking Status: quit     Year Quit: 2000  Current Problems (verified): 1)  Edema  (ICD-782.3) 2)  Obesity  (ICD-278.00) 3)  Gout  (ICD-274.9) 4)  Anemia  (ICD-285.9) 5)  Advef, Drug/medicinal/biological Subst Nos  (ICD-995.20) 6)  Microalbuminuria  (ICD-791.0) 7)  Hypertension  (ICD-401.9) 8)  Hyperlipidemia  (ICD-272.4) 9)  Diabetes Mellitus, Type II  (ICD-250.00) 10)  Depression  (ICD-311)  Past History:  Past Medical History: Last updated: 07/28/2006 Depression Diabetes mellitus, type II Hyperlipidemia Hypertension microalbuminuria  Past Surgical History: Last updated: 07/28/2006 Tonsillectomy, adenoidectomy  as  child  Family History: Last updated: 27-Sep-2006 father deceased---cerebral hemorrhage---htn Family History Hypertension mother-breast CA complications/MI--72 yo  Social History: Last updated: 09-27-06 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   Eyes:  pupils equal and pupils round.   Ears:  R ear normal and L ear normal.   Neck:  No deformities, masses, or tenderness noted. Chest Wall:  No deformities, masses, tenderness or gynecomastia noted. Lungs:  Normal respiratory effort, chest expands symmetrically. Lungs are clear to auscultation, no crackles or wheezes. Heart:  Normal rate and regular rhythm. S1 and S2 normal without gallop, murmur, click, rub or other extra sounds. Abdomen:  Bowel sounds positive,abdomen soft and non-tender without masses, organomegaly or hernias noted.  obese Msk:  No deformity or scoliosis noted of thoracic or lumbar spine.   Neurologic:  cranial nerves II-XII intact and gait normal.     Impression & Recommendations:  Problem # 1:  EDEMA (ICD-782.3) resolved---related to overuse of amlodipine His updated medication list for this problem includes:    Bumetanide 2 Mg Tabs (Bumetanide) ..... One daily    Benicar Hct 40-12.5 Mg Tabs (Olmesartan medoxomil-hctz) ..... One by mouth daily  Problem # 2:  OBESITY (ICD-278.00) has lost 9 pounds this is clearly his major problem and likely the cause of all of  his other conditions  Problem # 3:  ANEMIA (ICD-285.9) resolved Hgb: 14.5 (12/27/2006)   Hct: 42.4 (12/27/2006)   Platelets: 216 (12/27/2006) RBC: 4.66 (12/27/2006)   RDW: 12.9 (12/27/2006)   WBC: 8.3 (12/27/2006) MCV: 91.0 (12/27/2006)   MCHC: 34.1  (12/27/2006)  Problem # 4:  HYPERLIPIDEMIA (ICD-272.4) controlled note lfts His updated medication list for this problem includes:    Lipitor 20 Mg Tabs (Atorvastatin calcium) .Marland Kitchen... Take 1 tablet by mouth once a day  Labs Reviewed: SGOT: 67 (04/24/2009)   SGPT: 74 (04/24/2009)  Lipid Goals: Chol Goal: 200 (08/30/2006)   HDL Goal: 40 (08/30/2006)   LDL Goal: 100 (08/30/2006)   TG Goal: 150 (08/30/2006)  Prior 10 Yr Risk Heart Disease: 11 % (03/07/2007)   HDL:43.60 (04/24/2009), 39.20 (01/01/2009)  LDL:62 (04/24/2009), 64 (01/01/2009)  Chol:135 (04/24/2009), 136 (01/01/2009)  Trig:146.0 (04/24/2009), 163.0 (01/01/2009)  Problem # 5:  DIABETES MELLITUS, TYPE II (ICD-250.00)  His updated medication list for this problem includes:    Levemir Flexpen 100 Unit/ml Soln (Insulin detemir) .Marland KitchenMarland KitchenMarland KitchenMarland Kitchen 50 units subcutaneously two times a day    Metformin Hcl 850 Mg Tabs (Metformin hcl) .Marland Kitchen... Take 1 tablet by mouth twice a day    Ramipril 10 Mg Caps (Ramipril) ..... One by mouth daily    Benicar Hct 40-12.5 Mg Tabs (Olmesartan medoxomil-hctz) ..... One by mouth daily  Labs Reviewed: Creat: 1.4 (04/24/2009)     Last Eye Exam: normal-pt's report (12/18/2008) Reviewed HgBA1c results: 9.5 (04/24/2009)  11.0 (01/01/2009)  Complete Medication List: 1)  Bd U/f Iii Short Pen Needle 31g X 8 Mm Misc (Insulin pen needle) .... Use once daily 2)  Bumetanide 2 Mg Tabs (Bumetanide) .... One daily 3)  Labetalol Hcl 200 Mg Tabs (Labetalol hcl) .... 2 by mouth two times a day 4)  Levemir Flexpen 100 Unit/ml Soln (Insulin detemir) .... 50 units subcutaneously two times a day 5)  Lipitor 20 Mg Tabs (Atorvastatin calcium) .... Take 1 tablet by mouth once a day 6)  Metformin Hcl 850 Mg Tabs (Metformin hcl) .... Take 1 tablet by mouth twice a day 7)  Paxil 20 Mg Tabs (Paroxetine hcl) .... One q day 8)  Freestyle Lite Test Strp (Glucose blood) .... Use once daily as directed 9)  Freestyle Lancets Misc (Lancets) ....  Use once daily 10)  Ramipril 10 Mg Caps (Ramipril) .... One by mouth daily 11)  Benicar Hct 40-12.5 Mg Tabs (Olmesartan medoxomil-hctz) .... One by mouth daily 12)  Amlodipine Besylate 10 Mg Tabs (Amlodipine besylate) .... Once daily  Patient Instructions: 1)  Please schedule a follow-up appointment in 4 months. 2)  labs one week prior to visit 3)  lipids---272.4 4)  lfts-995.2 5)  bmet-995.2 6)  A1C-250.02 7)     Prescriptions: AMLODIPINE BESYLATE 10 MG  TABS (AMLODIPINE BESYLATE) once daily  #90 x 3   Entered and Authorized by:   Birdie Sons MD   Signed by:   Birdie Sons MD on 05/02/2009   Method used:   Historical   RxID:   [id number]

## 2010-02-17 NOTE — Assessment & Plan Note (Signed)
Summary: bilateral leg edema/dm   Vital Signs:  Patient profile:   65 year old male Weight:      220 pounds Temp:     982 degrees F oral Pulse rate:   72 / minute BP sitting:   104 / 62  Vitals Entered By: Lynann Beaver CMA (March 27, 2009 2:21 PM) CC: bilateral leg edema Is Patient Diabetic? Yes Pain Assessment Patient in pain? no        CC:  bilateral leg edema.  History of Present Illness: bilateral edema for 4 weeks progressive describes dependant edema  Current Medications (verified): 1)  Bd U/f Iii Short Pen Needle 31g X 8 Mm Misc (Insulin Pen Needle) .... Use Once Daily 2)  Bumetanide 2 Mg Tabs (Bumetanide) .... One Daily 3)  Labetalol Hcl 200 Mg  Tabs (Labetalol Hcl) .... 2 By Mouth Two Times A Day 4)  Levemir Flexpen 100 Unit/ml Soln (Insulin Detemir) .... 50 Units Subcutaneously Two Times A Day 5)  Lipitor 20 Mg Tabs (Atorvastatin Calcium) .... Take 1 Tablet By Mouth Once A Day 6)  Metformin Hcl 850 Mg Tabs (Metformin Hcl) .... Take 1 Tablet By Mouth Twice A Day 7)  Paxil 20 Mg  Tabs (Paroxetine Hcl) .... One Q Day 8)  Freestyle Lite Test  Strp (Glucose Blood) .... Use Once Daily As Directed 9)  Freestyle Lancets   Misc (Lancets) .... Use Once Daily 10)  Ramipril 10 Mg  Caps (Ramipril) .... One By Mouth Daily 11)  Amlodipine Besylate 10 Mg  Tabs (Amlodipine Besylate) .... One By Mouth Q Day 12)  Benicar Hct 40-12.5 Mg Tabs (Olmesartan Medoxomil-Hctz) .... One By Mouth Daily  Allergies (verified): 1)  ! 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   Extremities:  1-2+ edema bilaterally   Impression & Recommendations:  Problem # 1:  EDEMA (ICD-782.3)  likely cause is amlodipine His updated medication list for this problem includes:    Bumetanide 2 Mg Tabs (Bumetanide) ..... One daily    Benicar Hct 40-12.5 Mg Tabs (Olmesartan medoxomil-hctz) ..... One by mouth daily  Discussed elevation of the legs, use of compression stockings, sodium restiction, and medication use.   Complete Medication List: 1)  Bd U/f Iii Short Pen Needle 31g X 8 Mm Misc (Insulin pen needle) .... Use once daily 2)  Bumetanide 2 Mg Tabs (Bumetanide) .... One daily 3)  Labetalol Hcl 200 Mg Tabs (Labetalol hcl) .... 2 by mouth two times a day 4)  Levemir Flexpen 100 Unit/ml Soln (Insulin detemir) .... 50 units subcutaneously two times a day 5)  Lipitor 20 Mg Tabs (Atorvastatin calcium) .... Take 1 tablet by mouth once a day 6)  Metformin Hcl 850 Mg Tabs (Metformin hcl) .... Take 1 tablet by mouth twice a day 7)  Paxil 20 Mg Tabs (Paroxetine hcl) .... One q day 8)  Freestyle Lite Test Strp (Glucose blood) .... Use once daily as directed 9)  Freestyle Lancets Misc (Lancets) .Marland KitchenMarland KitchenMarland Kitchen  Use once daily 10)  Ramipril 10 Mg Caps (Ramipril) .... One by mouth daily 11)  Benicar Hct 40-12.5 Mg Tabs (Olmesartan medoxomil-hctz) .... One by mouth daily  Prevention & Chronic Care Immunizations   Influenza vaccine: Historical  (10/19/2006)    Tetanus booster: Not documented    Pneumococcal vaccine: Pneumovax  (09/11/2008)    H. zoster vaccine: Not documented  Colorectal Screening   Hemoccult:  Not documented    Colonoscopy: Done  (01/18/2005)  Other Screening   PSA: Not documented   Smoking status: quit  (08/30/2006)  Diabetes Mellitus   HgbA1C: 11.0  (01/01/2009)    Eye exam: normal-pt's report  (12/18/2008)   Eye exam due: 12/2009    Foot exam: yes  (01/08/2009)   High risk foot: Not documented   Foot care education: Not documented    Urine microalbumin/creatinine ratio: 58.8  (12/27/2006)  Lipids   Total Cholesterol: 136  (01/01/2009)   LDL: 64  (01/01/2009)   LDL Direct: Not documented   HDL: 39.20  (01/01/2009)   Triglycerides: 163.0  (01/01/2009)    SGOT (AST): 43  (01/01/2009)   SGPT (ALT): 68  (01/01/2009)   Alkaline phosphatase: 49  (01/01/2009)   Total bilirubin: 1.2  (01/01/2009)  Hypertension   Last Blood Pressure: 104 / 62  (03/27/2009)   Serum creatinine: 1.3  (01/01/2009)   Serum potassium 3.8  (01/01/2009)  Self-Management Support :    Diabetes self-management support: Not documented    Hypertension self-management support: Not documented    Lipid self-management support: Not documented

## 2010-02-17 NOTE — Letter (Signed)
Summary: Eye Exam/[facility]  Eye Exam/[facility]   Imported By: Maryln Gottron 11/13/2009 12:20:03  _____________________________________________________________________  External Attachment:    Type:   Image     Comment:   External Document

## 2010-02-17 NOTE — Progress Notes (Signed)
Summary: Preparing for a Colon   Phone Note Call from Patient Call back at Work Phone [phone]   Call For: Dr Arlyce Dice Reason for Call: Talk to Nurse Summary of Call: Is preparing for an upcoming Colon and may have eaten some things that he shouldnt have. Initial call taken by: Leanor Kail [facility],  December 08, 2009 8:24 AM  Follow-up for Phone Call        Ate salad yesterday. Procedure is tomorrow.  Told pt is okay.  Is on clear liquids today. Follow-up by: Ezra Sites RN,  December 08, 2009 8:49 AM

## 2010-02-17 NOTE — Letter (Signed)
Summary: Patient Notice- Polyp Results  [facility]  [address] [place], [place] [address]   Phone: [phone]  Fax: [phone]        December 15, 2009 MRN: [id number]    ERYCK NEGRON [address] [place], [place]  [address]    Dear Mr. BURKETT,  I am pleased to inform you that the colon polyp(s) removed during your recent colonoscopy was (were) found to be benign (no cancer detected) upon pathologic examination.  I recommend you have a repeat colonoscopy examination in 5_ years to look for recurrent polyps, as having colon polyps increases your risk for having recurrent polyps or even colon cancer in the future.  Should you develop new or worsening symptoms of abdominal pain, bowel habit changes or bleeding from the rectum or bowels, please schedule an evaluation with either your primary care physician or with me.  Additional information/recommendations:  __ No further action with gastroenterology is needed at this time. Please      follow-up with your primary care physician for your other healthcare      needs.  __ Please call [phone] to schedule a return visit to review your      situation.  __ Please keep your follow-up visit as already scheduled.  _x_ Continue treatment plan as outlined the day of your exam.  Please call us if you are having persistent problems or have questions about your condition that have not been fully answered at this time.  Sincerely,  Louis Meckel MD  This letter has been electronically signed by your physician.  Appended Document: Patient Notice- Polyp Results letter mailed

## 2010-02-17 NOTE — Letter (Signed)
Summary: Pre Visit Letter Revised  [facility]  [address] [place], [place] [address]   Phone: [phone]  Fax: [phone]        09/25/2009 MRN: [id number] Paul [name] [address] [place], [place]  [address]                   Procedure Date:  11-05-09  Welcome to the Gastroenterology Division at [facility]    You are scheduled to see a nurse for your pre-procedure visit on 10-22-09 at 2:30p.m. on the 3rd floor at [facility], [address]  We ask that you try to arrive at our office 15 minutes prior to your appointment time to allow for check-in.  Please take a minute to review the attached form.  If you answer "Yes" to one or more of the questions on the first page, we ask that you call the person listed at your earliest opportunity.  If you answer "No" to all of the questions, please complete the rest of the form and bring it to your appointment.    Your nurse visit will consist of discussing your medical and surgical history, your immediate family medical history, and your medications.   If you are unable to list all of your medications on the form, please bring the medication bottles to your appointment and we will list them.  We will need to be aware of both prescribed and over the counter drugs.  We will need to know exact dosage information as well.    Please be prepared to read and sign documents such as consent forms, a financial agreement, and acknowledgement forms.  If necessary, and with your consent, a friend or relative is welcome to sit-in on the nurse visit with you.  Please bring your insurance card so that we may make a copy of it.  If your insurance requires a referral to see a specialist, please bring your referral form from your primary care physician.  No co-pay is required for this nurse visit.     If you cannot keep your appointment, please call [phone] to cancel or reschedule prior to your appointment date.  This  allows [place] the opportunity to schedule an appointment for another patient in need of care.    Thank you for choosing [facility] for your medical needs.  We appreciate the opportunity to care for you.  Please visit [place] at our website  to learn more about our practice.  Sincerely, The Gastroenterology Division

## 2010-02-19 NOTE — Assessment & Plan Note (Signed)
Summary: bronchitis/dm   Vital Signs:  Patient profile:   65 year old male Temp:     98.0 degrees F oral BP sitting:   110 / 70  (left arm) Cuff size:   large  Vitals Entered By: Sid Falcon LPN (January 22, 2010 3:47 PM)  History of Present Illness: onset Monday.       This is a 65 year old man who presents with URI symptoms.  The patient complains of nasal congestion, clear nasal discharge, sore throat, and dry cough, but denies earache.  The patient denies fever, dyspnea, and wheezing.  Smokes cigars.  Taking possibly 2 different OTC cough meds with dextromethoriphan and he is cautioned about dangers of overuse.  Allergies (verified): No Known Drug Allergies  Past History:  Past Medical History: Last updated: 07/28/2006 Depression Diabetes mellitus, type II Hyperlipidemia Hypertension microalbuminuria PMH reviewed for relevance  Review of Systems  The patient denies anorexia, fever, hoarseness, chest pain, prolonged cough, headaches, and hemoptysis.    Physical Exam  General:  Well-developed,well-nourished,in no acute distress; alert,appropriate and cooperative throughout examination Ears:  External ear exam shows no significant lesions or deformities.  Otoscopic examination reveals clear canals, tympanic membranes are intact bilaterally without bulging, retraction, inflammation or discharge. Hearing is grossly normal bilaterally. Mouth:  Oral mucosa and oropharynx without lesions or exudates.  Teeth in good repair. Neck:  No deformities, masses, or tenderness noted. Lungs:  Normal respiratory effort, chest expands symmetrically. Lungs are clear to auscultation, no crackles or wheezes. Heart:  normal rate and regular rhythm.     Impression & Recommendations:  Problem # 1:  VIRAL URI (ICD-465.9) reassurance.  Try OTC plain mucinex.  Complete Medication List: 1)  Bd U/f Iii Short Pen Needle 31g X 8 Mm Misc (Insulin pen needle) .... Use once daily 2)  Bumetanide  2 Mg Tabs (Bumetanide) .... One daily 3)  Labetalol Hcl 200 Mg Tabs (Labetalol hcl) .... 2 by mouth two times a day 4)  Levemir Flexpen 100 Unit/ml Soln (Insulin detemir) .... 55 units subcutaneously two times a day 5)  Lipitor 20 Mg Tabs (Atorvastatin calcium) .... Take 1 tablet by mouth once a day 6)  Metformin Hcl 850 Mg Tabs (Metformin hcl) .... Take 1 tablet by mouth twice a day 7)  Paxil 20 Mg Tabs (Paroxetine hcl) .... One q day 8)  Freestyle Lite Test Strp (Glucose blood) .... Use once daily as directed 9)  Freestyle Lancets Misc (Lancets) .... Use once daily 10)  Ramipril 10 Mg Caps (Ramipril) .... One by mouth daily 11)  Benicar Hct 40-12.5 Mg Tabs (Olmesartan medoxomil-hctz) .... One by mouth daily 12)  Amlodipine Besylate 10 Mg Tabs (Amlodipine besylate) .... Once daily  Patient Instructions: 1)  Get plenty of rest, drink lots of clear liquids, and use Tylenol or Ibuprofen for fever and comfort. Return in 7-10 days if you're not better: sooner if you'er feeling worse.    Orders Added: 1)  Est. Patient Level III [91478]

## 2010-02-19 NOTE — Assessment & Plan Note (Signed)
Summary: 4 MTH ROV // RS   Vital Signs:  Patient profile:   65 year old male Weight:      203 pounds Temp:     98.5 degrees F oral Pulse rate:   88 / minute Pulse rhythm:   regular BP sitting:   116 / 74  (left arm) Cuff size:   large  Vitals Entered By: Alfred Levins, CMA (January 05, 2010 8:09 AM) CC: f/u on diabetes and BP   CC:  f/u on diabetes and BP.  History of Present Illness:  Follow-Up Visit      This is a 65 year old man who presents for Follow-up visit.  The patient denies chest pain and palpitations.  Since the last visit the patient notes no new problems or concerns.  The patient reports taking meds as prescribed, not monitoring blood sugars, dietary noncompliance, and not exercising.  When questioned about possible medication side effects, the patient notes none.    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 Use Once Daily 2)  Bumetanide 2 Mg Tabs (Bumetanide) .... One Daily 3)  Labetalol Hcl 200 Mg  Tabs (Labetalol Hcl) .... 2 By Mouth Two Times A Day 4)  Levemir Flexpen 100 Unit/ml Soln (Insulin Detemir) .... 55 Units Subcutaneously Two Times A Day 5)  Lipitor 20 Mg Tabs (Atorvastatin Calcium) .... Take 1 Tablet By Mouth Once A Day 6)  Metformin Hcl 850 Mg Tabs (Metformin Hcl) .... Take 1 Tablet By Mouth Twice A Day 7)  Paxil 20 Mg  Tabs (Paroxetine Hcl) .... One Q Day 8)  Freestyle Lite Test  Strp (Glucose Blood) .... Use Once Daily As Directed 9)  Freestyle Lancets   Misc (Lancets) .... Use Once Daily 10)  Ramipril 10 Mg  Caps (Ramipril)  .... One By Mouth Daily 11)  Benicar Hct 40-12.5 Mg Tabs (Olmesartan Medoxomil-Hctz) .... One By Mouth Daily 12)  Amlodipine Besylate 10 Mg  Tabs (Amlodipine Besylate) .... 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 He HEENT exam atraumatic, normocephalic symmetric her muscles are intact her neck is supple without jugular venous distention. Chest clear to auscultation cardiac exam S1-S2 are regular. Abdominal exam active bowel sounds, soft, nontender, obese. Extremities no edema. Neurologic exam alert with a normal gait.  Diabetes Management Exam:    Eye Exam:       Eye Exam done elsewhere          Date: 12/18/2009          Results: normal-pt's report          Done by: ophthalmo  Impression & Recommendations:  Problem # 1:  HYPERTENSION (ICD-401.9) controlled continue current medications  His updated medication list for this problem includes:    Bumetanide 2 Mg Tabs (Bumetanide) ..... One daily    Labetalol Hcl 200 Mg Tabs (Labetalol hcl) .Marland Kitchen... 2 by mouth two times a day    Ramipril 10 Mg Caps (Ramipril) ..... One by mouth daily    Benicar Hct 40-12.5 Mg Tabs (Olmesartan medoxomil-hctz) ..... One by mouth daily    Amlodipine Besylate 10 Mg Tabs (Amlodipine besylate) ..... Once daily  BP today:  116/74 Prior BP: 108/76 (09/04/2009)  Prior 10 Yr Risk Heart Disease: 11 % (03/07/2007)  Labs Reviewed: K+: 3.8 (12/30/2009) Creat: : 1.2 (12/30/2009)   Chol: 145 (12/30/2009)   HDL: 36.10 (12/30/2009)   LDL: 81 (12/30/2009)   TG: 140.0 (12/30/2009)  Problem # 2:  RETROGRADE EJACULATION (ICD-608.87) has appt with urology  Problem # 3:  GOUT (ICD-274.9) no recurrence  Problem # 4:  DIABETES MELLITUS, TYPE II (ICD-250.00) Assessment: Improved not as well controlled but better he continues to lose small amounts of weight. This is helping his blood pressure and diabetes. He is encouraged to continue exercise and follow a low calorie diet. continue to follow every 4 months His updated medication list for this problem includes:    Levemir Flexpen 100 Unit/ml Soln (Insulin detemir) .Marland KitchenMarland KitchenMarland KitchenMarland Kitchen 55 units subcutaneously two times a day    Metformin Hcl 850 Mg Tabs (Metformin hcl) .Marland Kitchen... Take 1 tablet by mouth twice a day    Ramipril 10 Mg Caps (Ramipril) ..... One by mouth daily    Benicar Hct 40-12.5 Mg Tabs (Olmesartan medoxomil-hctz) ..... One by mouth daily  Labs Reviewed: Creat: 1.2 (12/30/2009)     Last Eye Exam: normal-pt's report (12/18/2009) Reviewed HgBA1c results: 9.0 (12/30/2009)  10.6 (08/29/2009)  Complete Medication List: 1)  Bd U/f Iii Short Pen Needle 31g X 8 Mm Misc (Insulin pen needle) .... Use once daily 2)  Bumetanide 2 Mg Tabs (Bumetanide) .... One daily 3)  Labetalol Hcl 200 Mg Tabs (Labetalol hcl) .... 2 by mouth two times a day 4)  Levemir Flexpen 100 Unit/ml Soln (Insulin detemir) .... 55 units subcutaneously two times a day 5)  Lipitor 20 Mg Tabs (Atorvastatin calcium) .... Take 1 tablet by mouth once a day 6)  Metformin Hcl 850 Mg Tabs (Metformin hcl) .... Take 1 tablet by mouth twice a day 7)  Paxil 20 Mg Tabs (Paroxetine hcl) .... One q day 8)  Freestyle Lite Test Strp (Glucose blood) .... Use once daily as directed 9)  Freestyle Lancets Misc (Lancets) .... Use  once daily 10)  Ramipril 10 Mg Caps (Ramipril) .... One by mouth daily 11)  Benicar Hct 40-12.5 Mg Tabs (Olmesartan medoxomil-hctz) .... One by mouth daily 12)  Amlodipine Besylate 10 Mg Tabs (Amlodipine besylate) .... Once daily  Patient Instructions: 1)  Please schedule a follow-up appointment in 4 months. 2)  labs one week prior to visit 3)  lipids---272.4 4)  lfts-995.2 5)  bmet-995.2 6)  A1C-250.02 7)       Orders Added: 1)  Est. Patient Level IV [16109]   Immunization History:  Influenza Immunization History:    Influenza:  fluvax 3+ (10/18/2009)   Immunization History:  Influenza Immunization History:    Influenza:  Fluvax 3+ (10/18/2009)

## 2010-03-31 LAB — GLUCOSE, CAPILLARY: Glucose-Capillary: 160 mg/dL — ABNORMAL HIGH (ref 70–99)

## 2010-04-29 ENCOUNTER — Other Ambulatory Visit (INDEPENDENT_AMBULATORY_CARE_PROVIDER_SITE_OTHER): Payer: 59 | Admitting: Internal Medicine

## 2010-04-29 DIAGNOSIS — E119 Type 2 diabetes mellitus without complications: Secondary | ICD-10-CM

## 2010-04-29 LAB — LIPID PANEL
HDL: 39.8 mg/dL (ref >39.00)
LDL Cholesterol: 59 mg/dL (ref 0–99)
Total CHOL/HDL Ratio: 3
VLDL: 21.2 mg/dL (ref 0.0–40.0)

## 2010-04-29 LAB — BASIC METABOLIC PANEL
CO2: 28 mEq/L (ref 19–32)
Calcium: 9.3 mg/dL (ref 8.4–10.5)
GFR: 56.56 mL/min — ABNORMAL LOW (ref >60.00)
Potassium: 3.6 mEq/L (ref 3.5–5.1)
Sodium: 139 mEq/L (ref 135–145)

## 2010-04-29 LAB — HEPATIC FUNCTION PANEL
AST: 26 U/L (ref 0–37)
Total Bilirubin: 0.7 mg/dL (ref 0.3–1.2)

## 2010-05-06 ENCOUNTER — Ambulatory Visit: Payer: Self-pay | Admitting: Internal Medicine

## 2010-05-13 ENCOUNTER — Ambulatory Visit: Payer: Self-pay | Admitting: Internal Medicine

## 2010-05-26 ENCOUNTER — Encounter: Payer: Self-pay | Admitting: Internal Medicine

## 2010-05-27 ENCOUNTER — Encounter: Payer: Self-pay | Admitting: Internal Medicine

## 2010-05-27 ENCOUNTER — Ambulatory Visit (INDEPENDENT_AMBULATORY_CARE_PROVIDER_SITE_OTHER): Payer: 59 | Admitting: Internal Medicine

## 2010-05-27 VITALS — BP 104/66 | HR 80 | Temp 97.9°F | Ht 66.0 in | Wt 207.0 lb

## 2010-05-27 DIAGNOSIS — E785 Hyperlipidemia, unspecified: Secondary | ICD-10-CM

## 2010-05-27 DIAGNOSIS — I10 Essential (primary) hypertension: Secondary | ICD-10-CM

## 2010-05-27 DIAGNOSIS — E669 Obesity, unspecified: Secondary | ICD-10-CM

## 2010-05-27 DIAGNOSIS — E119 Type 2 diabetes mellitus without complications: Secondary | ICD-10-CM

## 2010-05-27 MED ORDER — AMLODIPINE BESYLATE 10 MG PO TABS: 5.0000 mg | ORAL_TABLET | Freq: Every day | ORAL | Status: DC

## 2010-05-27 NOTE — Assessment & Plan Note (Signed)
Controlled Continue same meds 

## 2010-05-27 NOTE — Assessment & Plan Note (Signed)
Controlled See new medication list

## 2010-05-27 NOTE — Progress Notes (Signed)
  Subjective:    Patient ID: Paul [name], male    DOB: May 25, 1945, 65 y.o.   MRN: [id number]  HPI   patient comes in for followup of multiple medical problems including type 2 diabetes, hyperlipidemia, hypertension. The patient does not check blood sugar or blood pressure at home. The patetient does not follow an exercise or diet program. The patient denies any polyuria, polydipsia.  In the past the patient has gone to diabetic treatment center. The patient is tolerating medications  Without difficulty. The patient does admit to medication compliance.  Past Medical History  Diagnosis Date  . Diabetes mellitus     type 2  . Depression   . Hypertension   . Hyperlipidemia   . Microalbuminuria    Past Surgical History  Procedure Date  . Tonsillectomy and adenoidectomy     reports that he has quit smoking. He does not have any smokeless tobacco history on file. His alcohol and drug histories not on file. family history includes Cancer in his mother; Cerebral aneurysm in his father; Heart attack in his mother; and Hypertension in his father. No Known Allergies   Review of Systems  patient denies chest pain, shortness of breath, orthopnea. Denies lower extremity edema, abdominal pain, change in appetite, change in bowel movements. Patient denies rashes, musculoskeletal complaints. No other specific complaints in a complete review of systems.      Objective:   Physical Exam  well-developed well-nourished male in no acute distress. HEENT exam atraumatic, normocephalic, neck supple without jugular venous distention. Chest clear to auscultation cardiac exam S1-S2 are regular. Abdominal exam overweight with bowel sounds, soft and nontender. Extremities no edema. Neurologic exam is alert with a normal gait.        Assessment & Plan:

## 2010-05-27 NOTE — Assessment & Plan Note (Signed)
This is his main issue---advised aggressive weight loss

## 2010-05-27 NOTE — Assessment & Plan Note (Signed)
Main issue here is weight Discussed need for aggressive weight loss.

## 2010-06-01 ENCOUNTER — Other Ambulatory Visit: Payer: Self-pay | Admitting: *Deleted

## 2010-06-01 MED ORDER — BUMETANIDE 2 MG PO TABS: 2.0000 mg | ORAL_TABLET | Freq: Every day | ORAL | Status: DC

## 2010-06-01 MED ORDER — PAROXETINE HCL 20 MG PO TABS: 20.0000 mg | ORAL_TABLET | ORAL | Status: DC

## 2010-06-01 MED ORDER — METFORMIN HCL 850 MG PO TABS: 850.0000 mg | ORAL_TABLET | Freq: Two times a day (BID) | ORAL | Status: DC

## 2010-06-05 NOTE — Consult Note (Signed)
[facility]                          ENDOCRINOLOGY CONSULTATION   NAME:Paul [name]                     MRN:          [id number]  DATE:01/21/2006                            DOB:          05-Apr-1945    REASON FOR REFERRAL:  Diabetes.   HISTORY OF PRESENT ILLNESS:  A 65 year old man with a 3-year history of  type 2 diabetes. He is unaware of any chronic complications. He has been  on insulin for one year. He recently had his Lantus increased to 20  units a day due to hyperglycemia. He seldom checks his glucose.  Symptomatically, he has several months of daytime somnolence but no  associated numbness of his feet. He has also had a slight weight gain of  10 pounds in the past year.   PAST MEDICAL HISTORY:  1. Hypertension.  2. Edema.  3. He had a sleep study recently which he said was borderline for      sleep apnea.   SOCIAL HISTORY:  He is married. 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   FAMILY HISTORY:  Negative for diabetes.   REVIEW OF SYSTEMS:  Blood pressure 130/81, heart rate 69, temperature  99.0. The weight is 230.  GENERAL:  Obese, no distress.  SKIN:  Not diaphoretic, no rash.  HEENT:  No proptosis. No periorbital swelling. Oropharynx is normal.  NECK:  No goiter.  CHEST:  Clear to auscultation.  No respiratory distress.  CARDIOVASCULAR:  No JVD. There is 1+ bilateral pretibial edema. Regular  rate and rhythm. No murmur. Pedal pulses are intact, and there is bruit  at the carotid arteries. Feet are normal color and temperature. There is  ulcer present on the feet.  NEUROLOGICAL:  Alert and oriented. Does not appear anxious or depressed,  and sensation is intact to touch on the feet.   LABORATORY STUDIES:  Forwarded by Dr. Cato Mulligan on November 23, 2005:  Hemoglobin A1c 8.6.   IMPRESSION:  1. Type 2 diabetes. Therapy is limited by his infrequent glucose      tests.  2. Edema.  3. Somnolence which in the setting  of a minimally abnormal sleep study      could be due to postprandial hyperglycemia.   PLAN:  1. Discontinue Actos.  2. We talked about the importance of diet and exercise therapy, and      the important risks of diabetes.  3. Check glucose daily, varying the time of day among a.c. and h.s.  4. Continue metformin and Lantus at their current dosages.  5. Return in about three weeks.     Sean A. Everardo All, MD  Electronically Signed    SAE/MedQ  DD: 01/21/2006  DT: 01/21/2006  Job #: [id number]   cc:   Paul Mole. Swords, MD

## 2010-06-05 NOTE — Procedures (Signed)
NAMEDOW, BLAHNIK NO.:  [id number]   MEDICAL RECORD NO.:  [id number]          PATIENT TYPE:  OUT   LOCATION:  SLEEP CENTER                 FACILITY:  [facility]   PHYSICIAN:  Marcelyn Bruins, M.D. [facility] DATE OF BIRTH:  April 01, 1945   DATE OF STUDY:  10/27/2003                              NOCTURNAL POLYSOMNOGRAM   REFERRING PHYSICIAN:  Bruce H. Swords, M.D.   INDICATION FOR THE STUDY:  Hypersomnia with sleep apnea.   EPWORTH SLEEPINESS SCORE:  4   SLEEP ARCHITECTURE:  The patient had a total sleep time of 418 minutes with  a sleep efficiency of 86%.  The REM quantity was fairly normal however,  there was very little slow wave sleep.  Sleep onset latency was mildly  prolonged as was the REM onset.   IMPRESSION:  1.  Split-night study reveals moderate obstructive sleep apnea/hypopnea      syndrome with 67 obstructive events noted in the first 174 minutes of      sleep.  This gave the patient a respiratory disturbance index of 23      events/hr and oxygen desaturation as low as 76%.  Events clearly were      worse in the supine position.  Very loud snoring was noted prior to CPAP      initiation.  As per split-night protocol the patient was fitted with a      Medium Respironics ComfortGel Mask and the pressure was increased      sequentially to a final level of 14 cm with adequate control of the      patient's obstructive sleep apnea.  2.  No clinically significant cardiac arrhythmias.  3.  Moderate numbers of leg jerks with mild sleep disruption.      KC/MEDQ  D:  11/12/2003 16:10:30  T:  11/12/2003 18:43:50  Job:  [id number]

## 2010-06-05 NOTE — Assessment & Plan Note (Signed)
[facility]                                 ON-CALL NOTE   NAME:Paul [name], Paul [name]                     MRN:          [id number]  DATE:04/22/2006                            DOB:          [birth year]    PRIMARY:  Dr. Everardo All.   SUBJECTIVE:  The patient has diabetes and has been changed from oral  medications to Humalog over the past few months.  He states that he has  continued to have elevated blood sugars.  He had a blood sugar after  dinner last night that was 535.  First thing this morning it was 250.  After dinner this evening it was 276.  He continues to titrate up his  Humalog as directed by Dr. Everardo All and is now on 70 units of Humalog  75/25 b.i.d.  He denies any signs and symptoms of infection.  He does  report being tired, but otherwise feels in good health.   ASSESSMENT/PLAN:  I instructed patient to continue titrating up insulin  as directed by Dr. Everardo All.  He will call Dr. George Hugh office on Monday  to let them know his blood sugar is still under poor control despite  higher doses of Humalog.     Kerby Nora, MD  Electronically Signed    AB/MedQ  DD: 04/22/2006  DT: 04/23/2006  Job #: [id number]

## 2010-06-29 ENCOUNTER — Telehealth: Payer: Self-pay | Admitting: *Deleted

## 2010-06-29 DIAGNOSIS — E785 Hyperlipidemia, unspecified: Secondary | ICD-10-CM

## 2010-06-29 NOTE — Telephone Encounter (Signed)
Pt is calling to double check on what medications he is suppose to be on. Also he still hasn't received his lipitor yet from [facility].

## 2010-07-02 MED ORDER — ATORVASTATIN CALCIUM 20 MG PO TABS: 20.0000 mg | ORAL_TABLET | Freq: Every day | ORAL | Status: DC

## 2010-07-02 NOTE — Telephone Encounter (Signed)
Pt needs status of his Lipitor rx to be sent to [facility]. Needs a 30 day supply sent to [facility]. Wants Arline Asp to return call about his medication. He has additional questions.

## 2010-07-03 NOTE — Telephone Encounter (Signed)
rx called in, pt aware 

## 2010-09-02 ENCOUNTER — Telehealth: Payer: Self-pay | Admitting: Internal Medicine

## 2010-09-02 NOTE — Telephone Encounter (Signed)
Made in error

## 2010-09-23 ENCOUNTER — Other Ambulatory Visit (INDEPENDENT_AMBULATORY_CARE_PROVIDER_SITE_OTHER): Payer: 59

## 2010-09-23 DIAGNOSIS — E119 Type 2 diabetes mellitus without complications: Secondary | ICD-10-CM

## 2010-09-23 DIAGNOSIS — E785 Hyperlipidemia, unspecified: Secondary | ICD-10-CM

## 2010-09-23 DIAGNOSIS — T887XXA Unspecified adverse effect of drug or medicament, initial encounter: Secondary | ICD-10-CM

## 2010-09-23 LAB — LIPID PANEL
HDL: 39.7 mg/dL (ref >39.00)
Triglycerides: 173 mg/dL — ABNORMAL HIGH (ref 0.0–149.0)
VLDL: 34.6 mg/dL (ref 0.0–40.0)

## 2010-09-23 LAB — HEPATIC FUNCTION PANEL
ALT: 54 U/L — ABNORMAL HIGH (ref 0–53)
Albumin: 3.7 g/dL (ref 3.5–5.2)
Total Bilirubin: 0.9 mg/dL (ref 0.3–1.2)
Total Protein: 6.8 g/dL (ref 6.0–8.3)

## 2010-09-23 LAB — HEMOGLOBIN A1C: Hgb A1c MFr Bld: 10.8 % — ABNORMAL HIGH (ref 4.6–6.5)

## 2010-09-23 LAB — BASIC METABOLIC PANEL
BUN: 23 mg/dL (ref 6–23)
Chloride: 100 mEq/L (ref 96–112)
Glucose, Bld: 246 mg/dL — ABNORMAL HIGH (ref 70–99)
Potassium: 3.5 mEq/L (ref 3.5–5.1)
Sodium: 140 mEq/L (ref 135–145)

## 2010-09-30 ENCOUNTER — Ambulatory Visit: Payer: 59 | Admitting: Internal Medicine

## 2010-10-06 ENCOUNTER — Other Ambulatory Visit: Payer: Self-pay | Admitting: *Deleted

## 2010-10-06 MED ORDER — INSULIN DETEMIR 100 UNIT/ML ~~LOC~~ SOLN: 55.0000 [IU] | Freq: Two times a day (BID) | SUBCUTANEOUS | Status: DC

## 2010-10-07 ENCOUNTER — Ambulatory Visit (INDEPENDENT_AMBULATORY_CARE_PROVIDER_SITE_OTHER): Payer: 59 | Admitting: Internal Medicine

## 2010-10-07 ENCOUNTER — Encounter: Payer: Self-pay | Admitting: Internal Medicine

## 2010-10-07 VITALS — BP 122/74 | Temp 98.6°F | Wt 204.0 lb

## 2010-10-07 DIAGNOSIS — E119 Type 2 diabetes mellitus without complications: Secondary | ICD-10-CM

## 2010-10-07 DIAGNOSIS — E785 Hyperlipidemia, unspecified: Secondary | ICD-10-CM

## 2010-10-07 DIAGNOSIS — Z23 Encounter for immunization: Secondary | ICD-10-CM

## 2010-10-07 DIAGNOSIS — E669 Obesity, unspecified: Secondary | ICD-10-CM

## 2010-10-07 MED ORDER — INSULIN LISPRO 100 UNIT/ML ~~LOC~~ SOLN: 8.0000 [IU] | Freq: Three times a day (TID) | SUBCUTANEOUS | Status: DC

## 2010-10-07 NOTE — Assessment & Plan Note (Signed)
Poor control Needs additional treatment Add humalog breakfast dinner (lunch if available) Side effects discussed Will start with low dose---he states he had hypoglycemia with previous short acting insulins

## 2010-10-14 NOTE — Assessment & Plan Note (Signed)
Discussed need for aggressive weight loss.  

## 2010-10-14 NOTE — Assessment & Plan Note (Signed)
Lab Results  Component Value Date   CHOL 137 09/23/2010   CHOL 120 04/29/2010   CHOL 145 12/30/2009   Lab Results  Component Value Date   HDL 39.70 09/23/2010   HDL 39.80 04/29/2010   HDL 16.10* 12/30/2009   Lab Results  Component Value Date   LDLCALC 63 09/23/2010   LDLCALC 59 04/29/2010   LDLCALC 81 12/30/2009   Lab Results  Component Value Date   TRIG 173.0* 09/23/2010   TRIG 106.0 04/29/2010   TRIG 140.0 12/30/2009   Lab Results  Component Value Date   CHOLHDL 3 09/23/2010   CHOLHDL 3 04/29/2010   CHOLHDL 4 12/30/2009   No results found for this basename: LDLDIRECT   Well controlled, Continue same meds

## 2010-10-14 NOTE — Progress Notes (Signed)
  Subjective:    Patient ID: Paul [name], male    DOB: [birth year], 65 y.o.   MRN: [id number]  HPI  patient comes in for followup of multiple medical problems including type 2 diabetes, hyperlipidemia, hypertension. The patient does not check blood sugar or blood pressure at home. The patetient does not follow an exercise or diet program. The patient denies any polyuria, polydipsia.  In the past the patient has gone to diabetic treatment center. The patient is tolerating medications  Without difficulty. The patient does admit to medication compliance.   Past Medical History  Diagnosis Date  . Diabetes mellitus     type 2  . Depression   . Hypertension   . Hyperlipidemia   . Microalbuminuria    Past Surgical History  Procedure Date  . Tonsillectomy and adenoidectomy     reports that he has quit smoking. He does not have any smokeless tobacco history on file. His alcohol and drug histories not on file. family history includes Cancer in his mother; Cerebral aneurysm in his father; Heart attack in his mother; and Hypertension in his father. No Known Allergies    Review of Systems  patient denies chest pain, shortness of breath, orthopnea. Denies lower extremity edema, abdominal pain, change in appetite, change in bowel movements. Patient denies rashes, musculoskeletal complaints. No other specific complaints in a complete review of systems.      Objective:   Physical Exam   well-developed well-nourished male in no acute distress. HEENT exam atraumatic, normocephalic, neck supple without jugular venous distention. Chest clear to auscultation cardiac exam S1-S2 are regular. Abdominal exam overweight with bowel sounds, soft and nontender. Extremities no edema. Neurologic exam is alert with a normal gait.       Assessment & Plan:

## 2010-10-15 ENCOUNTER — Telehealth: Payer: Self-pay | Admitting: Internal Medicine

## 2010-10-15 MED ORDER — INSULIN ASPART 100 UNIT/ML ~~LOC~~ SOLN: SUBCUTANEOUS | Status: DC

## 2010-10-15 NOTE — Telephone Encounter (Signed)
rx sent in electronically 

## 2010-10-15 NOTE — Telephone Encounter (Signed)
novolog is fine to substitute

## 2010-10-15 NOTE — Telephone Encounter (Signed)
Pharmacist said that pts Humalog vial 100u is not covered by pts insurance. The alternatives that are covered are Novolog or Apitra. Pls call [facility] and let us know which med is selected. [phone]   Ref# [id number]

## 2010-10-16 ENCOUNTER — Telehealth: Payer: Self-pay | Admitting: Internal Medicine

## 2010-10-16 NOTE — Telephone Encounter (Signed)
There are no units listed in the directions

## 2010-10-16 NOTE — Telephone Encounter (Signed)
Pharmacist needs clarification on Novolog. Pls call back asap.

## 2010-10-16 NOTE — Telephone Encounter (Signed)
See med list

## 2010-10-19 MED ORDER — INSULIN ASPART 100 UNIT/ML ~~LOC~~ SOLN: SUBCUTANEOUS | Status: DC

## 2010-10-19 NOTE — Telephone Encounter (Signed)
rx sent in electronically 

## 2010-10-21 ENCOUNTER — Other Ambulatory Visit: Payer: Self-pay | Admitting: Internal Medicine

## 2010-10-23 ENCOUNTER — Other Ambulatory Visit: Payer: Self-pay | Admitting: *Deleted

## 2010-10-23 MED ORDER — INSULIN DETEMIR 100 UNIT/ML ~~LOC~~ SOLN: 55.0000 [IU] | Freq: Two times a day (BID) | SUBCUTANEOUS | Status: DC

## 2010-11-13 ENCOUNTER — Encounter: Payer: Self-pay | Admitting: Internal Medicine

## 2010-12-21 ENCOUNTER — Telehealth: Payer: Self-pay | Admitting: Internal Medicine

## 2010-12-21 NOTE — Telephone Encounter (Signed)
Pt called and said that [facility] told pt that insulin aspart (NOVOLOG) 100 UNIT/ML injection has been discontinued. Pls advise pt of what he needs to do?

## 2010-12-22 MED ORDER — INSULIN LISPRO 100 UNIT/ML ~~LOC~~ SOLN: 8.0000 [IU] | Freq: Three times a day (TID) | SUBCUTANEOUS | Status: DC

## 2010-12-22 NOTE — Telephone Encounter (Signed)
Per Dr Cato Mulligan change to Humalog, rx sent in electronically, pt aware

## 2011-04-06 ENCOUNTER — Other Ambulatory Visit: Payer: 59

## 2011-04-12 ENCOUNTER — Other Ambulatory Visit (INDEPENDENT_AMBULATORY_CARE_PROVIDER_SITE_OTHER): Payer: 59

## 2011-04-12 DIAGNOSIS — E119 Type 2 diabetes mellitus without complications: Secondary | ICD-10-CM

## 2011-04-12 LAB — BASIC METABOLIC PANEL
BUN: 25 mg/dL — ABNORMAL HIGH (ref 6–23)
CO2: 32 mEq/L (ref 19–32)
Calcium: 9.5 mg/dL (ref 8.4–10.5)
GFR: 58.4 mL/min — ABNORMAL LOW (ref >60.00)
Glucose, Bld: 199 mg/dL — ABNORMAL HIGH (ref 70–99)
Potassium: 3.1 mEq/L — ABNORMAL LOW (ref 3.5–5.1)
Sodium: 140 mEq/L (ref 135–145)

## 2011-04-12 LAB — HEPATIC FUNCTION PANEL
AST: 31 U/L (ref 0–37)
Albumin: 3.6 g/dL (ref 3.5–5.2)
Total Bilirubin: 0.9 mg/dL (ref 0.3–1.2)

## 2011-04-12 LAB — LIPID PANEL
HDL: 40.8 mg/dL (ref >39.00)
LDL Cholesterol: 42 mg/dL (ref 0–99)
Total CHOL/HDL Ratio: 3
Triglycerides: 132 mg/dL (ref 0.0–149.0)

## 2011-04-12 NOTE — Progress Notes (Signed)
Quick Note:  Call patient, potassium is low. Have him start kdur 20 meq po qd, #90/ 3 refills . ______

## 2011-04-13 ENCOUNTER — Ambulatory Visit: Payer: 59 | Admitting: Internal Medicine

## 2011-04-15 ENCOUNTER — Other Ambulatory Visit: Payer: Self-pay | Admitting: *Deleted

## 2011-04-15 ENCOUNTER — Ambulatory Visit: Payer: 59 | Admitting: Internal Medicine

## 2011-04-15 MED ORDER — POTASSIUM CHLORIDE CRYS ER 20 MEQ PO TBCR: 20.0000 meq | EXTENDED_RELEASE_TABLET | Freq: Every day | ORAL | Status: DC

## 2011-04-19 ENCOUNTER — Encounter: Payer: Self-pay | Admitting: Family

## 2011-04-19 ENCOUNTER — Ambulatory Visit: Payer: 59 | Admitting: Internal Medicine

## 2011-04-19 ENCOUNTER — Ambulatory Visit (INDEPENDENT_AMBULATORY_CARE_PROVIDER_SITE_OTHER): Payer: 59 | Admitting: Family

## 2011-04-19 ENCOUNTER — Other Ambulatory Visit: Payer: Self-pay | Admitting: Family

## 2011-04-19 VITALS — BP 140/82 | Temp 98.7°F | Wt 213.0 lb

## 2011-04-19 DIAGNOSIS — M79604 Pain in right leg: Secondary | ICD-10-CM

## 2011-04-19 DIAGNOSIS — R1031 Right lower quadrant pain: Secondary | ICD-10-CM

## 2011-04-19 DIAGNOSIS — M79609 Pain in unspecified limb: Secondary | ICD-10-CM

## 2011-04-19 LAB — CBC WITH DIFFERENTIAL/PLATELET
Basophils Absolute: 0 10*3/uL (ref 0.0–0.1)
Basophils Relative: 0.5 % (ref 0.0–3.0)
Eosinophils Absolute: 0.2 10*3/uL (ref 0.0–0.7)
HCT: 42.1 % (ref 39.0–52.0)
Hemoglobin: 14.1 g/dL (ref 13.0–17.0)
Lymphs Abs: 1.8 10*3/uL (ref 0.7–4.0)
MCHC: 33.3 g/dL (ref 30.0–36.0)
MCV: 92.6 fl (ref 78.0–100.0)
Monocytes Absolute: 0.6 10*3/uL (ref 0.1–1.0)
Neutro Abs: 5.9 10*3/uL (ref 1.4–7.7)
RBC: 4.55 Mil/uL (ref 4.22–5.81)
RDW: 14.1 % (ref 11.5–14.6)

## 2011-04-19 LAB — BASIC METABOLIC PANEL
CO2: 32 mEq/L (ref 19–32)
Chloride: 99 mEq/L (ref 96–112)
Glucose, Bld: 358 mg/dL — ABNORMAL HIGH (ref 70–99)
Sodium: 138 mEq/L (ref 135–145)

## 2011-04-19 MED ORDER — TRAMADOL HCL 50 MG PO TABS: 50.0000 mg | ORAL_TABLET | Freq: Three times a day (TID) | ORAL | Status: DC | PRN

## 2011-04-19 NOTE — Progress Notes (Signed)
Subjective:    Patient ID: Paul [name], male    DOB: Jun 24, 1945, 66 y.o.   MRN: [id number]  HPI Comments: C/o neck discomfort radiating down rt shoulder to groin and rt leg new onset two weeks ago. Was diagnosed Mon with hypokalemia and started on potassium supplements. Denies cp, dyspnea, swelling to lower extremities, nausea, diaphoresis, or urinary s/s. Described pain as intermittent, constant although pain seems to worsen at 3a and goes up to 10/10. Nothing relieves the pain. Does not take any OTC. Ranks as 3/10. Has full range of motion. Denies numbness or tingling.   Groin Pain      Review of Systems  Constitutional: Negative.   Respiratory: Negative.   Cardiovascular: Negative.   Musculoskeletal: Positive for arthralgias. Negative for myalgias, back pain, joint swelling and gait problem.  Neurological: Negative.    Past Medical History  Diagnosis Date  . Diabetes mellitus     type 2  . Depression   . Hypertension   . Hyperlipidemia   . Microalbuminuria     History   Social History  . Marital Status: Married    Spouse Name: N/A    Number of Children: N/A  . Years of Education: N/A   Occupational History  . Not on file.   Social History Main Topics  . Smoking status: Former Games developer  . Smokeless tobacco: Not on file  . Alcohol Use:   . Drug Use:   . Sexually Active:    Other Topics Concern  . Not on file   Social History Narrative  . No narrative on file    Past Surgical History  Procedure Date  . Tonsillectomy and adenoidectomy     Family History  Problem Relation Age of Onset  . Cancer Mother     breast  . Heart attack Mother   . Hypertension Father   . Cerebral aneurysm Father     No Known Allergies  Current Outpatient Prescriptions on File Prior to Visit  Medication Sig Dispense Refill  . amLODipine (NORVASC) 10 MG tablet Take 0.5 tablets (5 mg total) by mouth daily.      Marland Kitchen atorvastatin (LIPITOR) 20 MG tablet Take 1 tablet (20 mg  total) by mouth daily.  90 tablet  3  . bumetanide (BUMEX) 2 MG tablet Take 1 tablet (2 mg total) by mouth daily.  90 tablet  3  . glucose blood (FREESTYLE TEST STRIPS) test strip 1 each by Other route daily. Use as instructed       . insulin detemir (LEVEMIR FLEXPEN) 100 UNIT/ML injection Inject 55 Units into the skin 2 (two) times daily.  3 mL  3  . insulin lispro (HUMALOG) 100 UNIT/ML injection Inject 8 Units into the skin 3 (three) times daily before meals.  30 mL  3  . Insulin Pen Needle (B-D ULTRAFINE III SHORT PEN) 31G X 8 MM MISC by Does not apply route daily.        Marland Kitchen labetalol (NORMODYNE) 200 MG tablet TAKE 2 TABLETS TWICE A DAY  200 tablet  3  . Lancets (FREESTYLE) lancets 1 each by Other route daily. Use as instructed       . metFORMIN (GLUCOPHAGE) 850 MG tablet Take 1 tablet (850 mg total) by mouth 2 (two) times daily with a meal.  180 tablet  3  . olmesartan-hydrochlorothiazide (BENICAR HCT) 40-12.5 MG per tablet Take 1 tablet by mouth daily.        Marland Kitchen PARoxetine (PAXIL) 20 MG tablet  Take 1 tablet (20 mg total) by mouth every morning.  90 tablet  3  . potassium chloride SA (K-DUR,KLOR-CON) 20 MEQ tablet Take 1 tablet (20 mEq total) by mouth daily.  90 tablet  3  . ramipril (ALTACE) 10 MG capsule TAKE 1 CAPSULE DAILY  90 capsule  3    BP 140/82  Temp(Src) 98.7 F (37.1 C) (Oral)  Wt 213 lb (96.616 kg)chart    Objective:   Physical Exam  Constitutional: He is oriented to person, place, and time. He appears well-developed and well-nourished. No distress.  HENT:  Head: Normocephalic and atraumatic.  Cardiovascular: Normal rate, regular rhythm, normal heart sounds and intact distal pulses.  Exam reveals no gallop and no friction rub.   No murmur heard. Pulmonary/Chest: Effort normal and breath sounds normal. No respiratory distress. He has no wheezes. He has no rales. He exhibits no tenderness.  Abdominal: Soft. Bowel sounds are normal. He exhibits no distension. There is no  tenderness. There is no rebound and no guarding.  Musculoskeletal: Normal range of motion. He exhibits no edema and no tenderness.  Neurological: He is alert and oriented to person, place, and time.  Skin: Skin is warm and dry. He is not diaphoretic.          Assessment & Plan:  Assessment: Right groin pain, Right leg pain  Plan: Labs: BMP. Teaching handout provided on diagnosis and treatment options. Encouraged to RTC if s/s get worse.

## 2011-04-20 ENCOUNTER — Telehealth: Payer: Self-pay | Admitting: *Deleted

## 2011-04-20 ENCOUNTER — Telehealth: Payer: Self-pay | Admitting: Family Medicine

## 2011-04-20 ENCOUNTER — Encounter (INDEPENDENT_AMBULATORY_CARE_PROVIDER_SITE_OTHER): Payer: 59

## 2011-04-20 ENCOUNTER — Other Ambulatory Visit: Payer: Self-pay | Admitting: Family

## 2011-04-20 ENCOUNTER — Other Ambulatory Visit: Payer: Self-pay | Admitting: Cardiology

## 2011-04-20 DIAGNOSIS — R52 Pain, unspecified: Secondary | ICD-10-CM

## 2011-04-20 DIAGNOSIS — M79604 Pain in right leg: Secondary | ICD-10-CM

## 2011-04-20 DIAGNOSIS — M79609 Pain in unspecified limb: Secondary | ICD-10-CM

## 2011-04-20 NOTE — Telephone Encounter (Signed)
Patient's Venus doppler was negative.

## 2011-04-20 NOTE — Telephone Encounter (Signed)
Spoke with pt to notify him of doppler. Will await call from Terri

## 2011-04-20 NOTE — Telephone Encounter (Signed)
Pt was seen by [facility] yesterday and was given tramadol.  He was up all night in pain and wanted to know what he could do next

## 2011-04-20 NOTE — Telephone Encounter (Signed)
Left message for Paul [name] to call back and let me know if I need to call the pt;

## 2011-04-20 NOTE — Telephone Encounter (Signed)
I will put an order in for arterial doppler study.

## 2011-04-21 NOTE — Telephone Encounter (Signed)
Venous Doppler negative, We can refer to ortho if pain persist.

## 2011-04-21 NOTE — Telephone Encounter (Signed)
Left message on personally identified voicemail to notify pt of results. Advised pt to return call to let me know if he would like the ortho referral

## 2011-04-22 ENCOUNTER — Telehealth: Payer: Self-pay | Admitting: Internal Medicine

## 2011-04-22 DIAGNOSIS — M79604 Pain in right leg: Secondary | ICD-10-CM

## 2011-04-22 NOTE — Telephone Encounter (Signed)
Pt requesting the ortho ref for rt leg pain. Ref placed

## 2011-04-22 NOTE — Telephone Encounter (Signed)
Pt returned call from Tamesha re: otho referral. Pt says that he has question re: a test he had done.

## 2011-04-23 ENCOUNTER — Ambulatory Visit (INDEPENDENT_AMBULATORY_CARE_PROVIDER_SITE_OTHER): Payer: 59 | Admitting: Internal Medicine

## 2011-04-23 DIAGNOSIS — M5410 Radiculopathy, site unspecified: Secondary | ICD-10-CM

## 2011-04-23 DIAGNOSIS — E119 Type 2 diabetes mellitus without complications: Secondary | ICD-10-CM

## 2011-04-23 DIAGNOSIS — IMO0002 Reserved for concepts with insufficient information to code with codable children: Secondary | ICD-10-CM

## 2011-04-23 DIAGNOSIS — I10 Essential (primary) hypertension: Secondary | ICD-10-CM

## 2011-04-23 DIAGNOSIS — E785 Hyperlipidemia, unspecified: Secondary | ICD-10-CM

## 2011-04-23 MED ORDER — LOSARTAN POTASSIUM 100 MG PO TABS: 100.0000 mg | ORAL_TABLET | Freq: Every day | ORAL | Status: DC

## 2011-04-23 MED ORDER — MELOXICAM 7.5 MG PO TABS: 7.5000 mg | ORAL_TABLET | Freq: Every day | ORAL | Status: DC

## 2011-04-23 MED ORDER — INSULIN LISPRO 100 UNIT/ML ~~LOC~~ SOLN: 8.0000 [IU] | Freq: Three times a day (TID) | SUBCUTANEOUS | Status: DC

## 2011-04-23 MED ORDER — CYCLOBENZAPRINE HCL 10 MG PO TABS: 10.0000 mg | ORAL_TABLET | Freq: Three times a day (TID) | ORAL | Status: AC | PRN

## 2011-04-23 NOTE — Progress Notes (Signed)
Patient ID: Paul [name], male   DOB: 10-13-1945, 66 y.o.   MRN: [id number]   patient comes in for followup of multiple medical problems including type 2 diabetes, hyperlipidemia, hypertension. The patient does not check blood sugar or blood pressure at home. The patetient does not follow an exercise or diet program. The patient denies any polyuria, polydipsia.  In the past the patient has gone to diabetic treatment center. The patient is tolerating medications  Without difficulty. The patient does admit to medication compliance (except ran out of humalog)  In addition pt complains of right groin/hip pain. Started after doing yard work (the next day). Pain can extend from hip to thigh/knee.  Past Medical History  Diagnosis Date  . Diabetes mellitus     type 2  . Depression   . Hypertension   . Hyperlipidemia   . Microalbuminuria     History   Social History  . Marital Status: Married    Spouse Name: N/A    Number of Children: N/A  . Years of Education: N/A   Occupational History  . Not on file.   Social History Main Topics  . Smoking status: Former Games developer  . Smokeless tobacco: Not on file  . Alcohol Use:   . Drug Use:   . Sexually Active:    Other Topics Concern  . Not on file   Social History Narrative  . No narrative on file    Past Surgical History  Procedure Date  . Tonsillectomy and adenoidectomy     Family History  Problem Relation Age of Onset  . Cancer Mother     breast  . Heart attack Mother   . Hypertension Father   . Cerebral aneurysm Father     No Known Allergies  Current Outpatient Prescriptions on File Prior to Visit  Medication Sig Dispense Refill  . amLODipine (NORVASC) 10 MG tablet Take 0.5 tablets (5 mg total) by mouth daily.      Marland Kitchen atorvastatin (LIPITOR) 20 MG tablet Take 1 tablet (20 mg total) by mouth daily.  90 tablet  3  . bumetanide (BUMEX) 2 MG tablet Take 1 tablet (2 mg total) by mouth daily.  90 tablet  3  . glucose blood  (FREESTYLE TEST STRIPS) test strip 1 each by Other route daily. Use as instructed       . insulin detemir (LEVEMIR FLEXPEN) 100 UNIT/ML injection Inject 55 Units into the skin 2 (two) times daily.  3 mL  3  . Insulin Pen Needle (B-D ULTRAFINE III SHORT PEN) 31G X 8 MM MISC by Does not apply route daily.        Marland Kitchen labetalol (NORMODYNE) 200 MG tablet TAKE 2 TABLETS TWICE A DAY  200 tablet  3  . Lancets (FREESTYLE) lancets 1 each by Other route daily. Use as instructed       . metFORMIN (GLUCOPHAGE) 850 MG tablet Take 1 tablet (850 mg total) by mouth 2 (two) times daily with a meal.  180 tablet  3  . olmesartan-hydrochlorothiazide (BENICAR HCT) 40-12.5 MG per tablet Take 1 tablet by mouth daily.        Marland Kitchen PARoxetine (PAXIL) 20 MG tablet Take 1 tablet (20 mg total) by mouth every morning.  90 tablet  3  . potassium chloride SA (K-DUR,KLOR-CON) 20 MEQ tablet Take 1 tablet (20 mEq total) by mouth daily.  90 tablet  3  . ramipril (ALTACE) 10 MG capsule TAKE 1 CAPSULE DAILY  90 capsule  3  . traMADol (ULTRAM) 50 MG tablet Take 1-2 tablets (50-100 mg total) by mouth every 8 (eight) hours as needed for pain.  45 tablet  0  . DISCONTD: insulin lispro (HUMALOG) 100 UNIT/ML injection Inject 8 Units into the skin 3 (three) times daily before meals.  30 mL  3     patient denies chest pain, shortness of breath, orthopnea. Denies lower extremity edema, abdominal pain, change in appetite, change in bowel movements. Patient denies rashes, musculoskeletal complaints. No other specific complaints in a complete review of systems.   BP 154/90  Pulse 80  Temp(Src) 98.5 F (36.9 C) (Oral)  Wt 210 lb (95.255 kg)  well-developed well-nourished male in no acute distress. HEENT exam atraumatic, normocephalic, neck supple without jugular venous distention. Chest clear to auscultation cardiac exam S1-S2 are regular. Abdominal exam overweight with bowel sounds, soft and nontender. Extremities no edema. Neurologic exam is alert  with a normal gait.

## 2011-04-23 NOTE — Assessment & Plan Note (Signed)
Not as well controlled as I would like He did not get short acting insulin filled He will do that today and take as prescribed

## 2011-04-24 ENCOUNTER — Other Ambulatory Visit: Payer: Self-pay | Admitting: Internal Medicine

## 2011-04-25 NOTE — Assessment & Plan Note (Signed)
Continue currecnt medications

## 2011-04-25 NOTE — Assessment & Plan Note (Signed)
BP Readings from Last 3 Encounters:  04/23/11 154/90  04/19/11 140/82  10/07/10 122/74   Will change meds (cost)

## 2011-04-27 ENCOUNTER — Other Ambulatory Visit: Payer: Self-pay | Admitting: *Deleted

## 2011-04-29 ENCOUNTER — Other Ambulatory Visit: Payer: Self-pay | Admitting: *Deleted

## 2011-04-29 MED ORDER — INSULIN ASPART 100 UNIT/ML ~~LOC~~ SOLN: 8.0000 [IU] | Freq: Three times a day (TID) | SUBCUTANEOUS | Status: DC

## 2011-05-03 ENCOUNTER — Ambulatory Visit: Payer: 59 | Admitting: Physical Therapy

## 2011-05-07 ENCOUNTER — Other Ambulatory Visit: Payer: Self-pay | Admitting: *Deleted

## 2011-05-07 MED ORDER — INSULIN DETEMIR 100 UNIT/ML ~~LOC~~ SOLN: 55.0000 [IU] | Freq: Two times a day (BID) | SUBCUTANEOUS | Status: DC

## 2011-06-05 ENCOUNTER — Other Ambulatory Visit: Payer: Self-pay | Admitting: Internal Medicine

## 2011-06-09 ENCOUNTER — Other Ambulatory Visit: Payer: Self-pay | Admitting: Internal Medicine

## 2011-06-16 ENCOUNTER — Telehealth: Payer: Self-pay | Admitting: Family Medicine

## 2011-06-16 NOTE — Telephone Encounter (Signed)
Pulled from Triage vmail. Pharmacy called at 9:58am. Please use reference # [id number] when calling back. She states that pt has duplicate meds: losartan and ramipril. They need clarification if he is taking both, or which he is taking. Thanks.

## 2011-07-02 ENCOUNTER — Other Ambulatory Visit: Payer: Self-pay | Admitting: *Deleted

## 2011-07-02 MED ORDER — INSULIN ASPART 100 UNIT/ML ~~LOC~~ SOLN: 8.0000 [IU] | Freq: Three times a day (TID) | SUBCUTANEOUS | Status: DC

## 2011-07-04 ENCOUNTER — Other Ambulatory Visit: Payer: Self-pay | Admitting: Internal Medicine

## 2011-08-30 ENCOUNTER — Other Ambulatory Visit: Payer: Self-pay | Admitting: Internal Medicine

## 2011-08-30 DIAGNOSIS — L723 Sebaceous cyst: Secondary | ICD-10-CM

## 2011-09-13 ENCOUNTER — Encounter (INDEPENDENT_AMBULATORY_CARE_PROVIDER_SITE_OTHER): Payer: Self-pay | Admitting: Surgery

## 2011-09-13 ENCOUNTER — Ambulatory Visit (INDEPENDENT_AMBULATORY_CARE_PROVIDER_SITE_OTHER): Payer: 59 | Admitting: Surgery

## 2011-09-13 ENCOUNTER — Other Ambulatory Visit (INDEPENDENT_AMBULATORY_CARE_PROVIDER_SITE_OTHER): Payer: Self-pay | Admitting: Surgery

## 2011-09-13 VITALS — BP 146/90 | HR 68 | Temp 96.4°F | Resp 14 | Ht 66.0 in | Wt 212.0 lb

## 2011-09-13 DIAGNOSIS — L723 Sebaceous cyst: Secondary | ICD-10-CM

## 2011-09-13 NOTE — Progress Notes (Signed)
Chief Complaint:  Sebaceous cyst   History of Present Illness:  Paul [name] is an 66 y.o. male referred by Dr. Cato Mulligan with a sebaceous cyst.  He has had a previous cyst on his back. He understands what these are. His his gets big and small but hasn't really been infected or drained according to him. He is diabetic.  I described removal at [facility] day surgery in the minor room.  We will do this under local.glomus Moffitt saw her cough at  Past Medical History  Diagnosis Date  . Diabetes mellitus     type 2  . Depression   . Hypertension   . Hyperlipidemia   . Microalbuminuria     Past Surgical History  Procedure Date  . Tonsillectomy and adenoidectomy     Current Outpatient Prescriptions  Medication Sig Dispense Refill  . amLODipine (NORVASC) 10 MG tablet TAKE 1 TABLET DAILY  90 tablet  3  . atorvastatin (LIPITOR) 20 MG tablet Take 1 tablet (20 mg total) by mouth daily.  90 tablet  3  . B-D ULTRAFINE III SHORT PEN 31G X 8 MM MISC USE ONCE DAILY  100 each  11  . bumetanide (BUMEX) 2 MG tablet TAKE 1 TABLET DAILY  90 tablet  3  . FREESTYLE LITE test strip USE AS DIRECTED ONCE A DAY  100 each  11  . insulin aspart (NOVOLOG) 100 UNIT/ML injection Inject 8 Units into the skin 3 (three) times daily before meals.  3 pen  0  . insulin detemir (LEVEMIR FLEXPEN) 100 UNIT/ML injection Inject 55 Units into the skin 2 (two) times daily.  9 mL  3  . labetalol (NORMODYNE) 200 MG tablet TAKE 2 TABLETS TWICE A DAY  200 tablet  3  . Lancets (FREESTYLE) lancets USE ONCE DAILY  100 each  11  . losartan (COZAAR) 100 MG tablet Take 1 tablet (100 mg total) by mouth daily.  90 tablet  3  . meloxicam (MOBIC) 7.5 MG tablet Take 1 tablet (7.5 mg total) by mouth daily.  30 tablet  0  . metFORMIN (GLUCOPHAGE) 850 MG tablet TAKE 1 TABLET TWICE A DAY  WITH MEALS  180 tablet  3  . PARoxetine (PAXIL) 20 MG tablet TAKE 1 TABLET DAILY  90 tablet  3  . potassium chloride SA (K-DUR,KLOR-CON) 20 MEQ tablet Take 1  tablet (20 mEq total) by mouth daily.  90 tablet  3  . ramipril (ALTACE) 10 MG capsule TAKE 1 CAPSULE DAILY  90 capsule  3   Review of patient's allergies indicates no known allergies. Family History  Problem Relation Age of Onset  . Cancer Mother     breast  . Heart attack Mother   . Hypertension Father   . Cerebral aneurysm Father    Social History:   reports that he quit smoking about 13 years ago. His smoking use included Cigars and Cigarettes. He does not have any smokeless tobacco history on file. He reports that he does not drink alcohol or use illicit drugs.   REVIEW OF SYSTEMS - PERTINENT POSITIVES ONLY: positive  Physical Exam:   Blood pressure 146/90, pulse 68, temperature 96.4 F (35.8 C), temperature source Temporal, resp. rate 14, height 5\' 6"  (1.676 m), weight 212 lb (96.163 kg). Body mass index is 34.22 kg/(m^2).  Gen:  WDWN WM NAD  Neurological: Alert and oriented to person, place, and time. Motor and sensory function is grossly intact  Head: Normocephalic and atraumatic.  Eyes: Conjunctivae  are normal. Pupils are equal, round, and reactive to light. No scleral icterus.  Neck: Normal range of motion. Neck supple. No tracheal deviation or thyromegaly present. On the left side there is a 1 cm noninfected sebaceous cyst.  No punctum identified Cardiovascular:  SR without murmurs or gallops.  No carotid bruits Respiratory: Effort normal.  No respiratory distress. No chest wall tenderness. Breath sounds normal.  No wheezes, rales or rhonchi.  Abdomen:  nontender GU: Musculoskeletal: Normal range of motion. Extremities are nontender. No cyanosis, edema or clubbing noted Lymphadenopathy: No cervical, preauricular, postauricular or axillary adenopathy is present Skin: Skin is warm and dry. No rash noted. No diaphoresis. No erythema. No pallor. Pscyh: Normal mood and affect. Behavior is normal. Judgment and thought content normal.   LABORATORY RESULTS: No results found  for this or any previous visit (from the past 48 hour(s)).  RADIOLOGY RESULTS: No results found.  Problem List: Patient Active Problem List  Diagnosis  . DIABETES MELLITUS, TYPE II  . HYPERLIPIDEMIA  . GOUT  . OBESITY  . ANEMIA  . DEPRESSION  . HYPERTENSION  . RETROGRADE EJACULATION  . MICROALBUMINURIA  . Sebaceous cyst-left neck    Assessment & Plan: Noninfected sebacous cyst Excision under local at [facility] Day Surgery    Matt B. Daphine Deutscher, MD, [facility] [phone] beeper [phone]  09/13/2011 3:05 PM

## 2011-09-13 NOTE — Patient Instructions (Addendum)
Thanks for your patience.  If you need further assistance after leaving the office, please call our office and speak with a nurse.  [phone].  If you want to leave a message for Dr. Daphine Deutscher, please call his office phone at [phone].

## 2011-09-29 ENCOUNTER — Encounter (HOSPITAL_BASED_OUTPATIENT_CLINIC_OR_DEPARTMENT_OTHER)
Admission: RE | Disposition: A | Discharge status: Home / Self Care | Payer: Self-pay | Source: Ambulatory Visit | Attending: Surgery

## 2011-09-29 ENCOUNTER — Ambulatory Visit (HOSPITAL_BASED_OUTPATIENT_CLINIC_OR_DEPARTMENT_OTHER)
Admission: RE | Admit: 2011-09-29 | Discharge: 2011-09-29 | Disposition: A | Discharge status: Home / Self Care | Payer: 59 | Source: Ambulatory Visit | Attending: Surgery | Admitting: Surgery

## 2011-09-29 DIAGNOSIS — L723 Sebaceous cyst: Secondary | ICD-10-CM

## 2011-09-29 DIAGNOSIS — I10 Essential (primary) hypertension: Secondary | ICD-10-CM | POA: Insufficient documentation

## 2011-09-29 DIAGNOSIS — E119 Type 2 diabetes mellitus without complications: Secondary | ICD-10-CM | POA: Insufficient documentation

## 2011-09-29 HISTORY — PX: MASS EXCISION: SHX2000

## 2011-09-29 SURGERY — MINOR EXCISION OF MASS
Anesthesia: LOCAL | Site: Neck | Laterality: Left | Wound class: Clean

## 2011-09-29 MED ORDER — HYDROCODONE-ACETAMINOPHEN 5-500 MG PO TABS: 1.0000 | ORAL_TABLET | Freq: Every day | ORAL | Status: DC

## 2011-09-29 MED ORDER — HEPARIN SODIUM (PORCINE) 5000 UNIT/ML IJ SOLN: 5000.0000 [IU] | Freq: Once | INTRAMUSCULAR | Status: DC

## 2011-09-29 MED ORDER — SODIUM BICARBONATE 4 % IV SOLN
INTRAVENOUS | Status: DC | PRN
  Administered 2011-09-29: 10:00:00 via INTRAMUSCULAR

## 2011-09-29 SURGICAL SUPPLY — 22 items
BENZOIN TINCTURE PRP APPL 2/3 (GAUZE/BANDAGES/DRESSINGS) IMPLANT
BLADE SURG 15 STRL LF DISP TIS (BLADE) ×1 IMPLANT
BLADE SURG 15 STRL SS (BLADE) ×1
CLOTH BEACON ORANGE TIMEOUT ST (SAFETY) ×2 IMPLANT
DERMABOND ADVANCED (GAUZE/BANDAGES/DRESSINGS) ×1
DERMABOND ADVANCED .7 DNX12 (GAUZE/BANDAGES/DRESSINGS) ×1 IMPLANT
ELECT REM PT RETURN 9FT ADLT (ELECTROSURGICAL)
ELECTRODE REM PT RTRN 9FT ADLT (ELECTROSURGICAL) IMPLANT
GAUZE SPONGE 4X4 12PLY STRL LF (GAUZE/BANDAGES/DRESSINGS) IMPLANT
GLOVE BIO SURGEON STRL SZ8 (GLOVE) ×2 IMPLANT
MARKER SKIN DUAL TIP RULER LAB (MISCELLANEOUS) ×2 IMPLANT
NEEDLE HYPO 30X.5 LL (NEEDLE) ×2 IMPLANT
PENCIL BUTTON HOLSTER BLD 10FT (ELECTRODE) IMPLANT
SPONGE GAUZE 4X4 12PLY (GAUZE/BANDAGES/DRESSINGS) ×2 IMPLANT
STRIP CLOSURE SKIN 1/2X4 (GAUZE/BANDAGES/DRESSINGS) IMPLANT
SUT PROLENE 5 0 P 3 (SUTURE) IMPLANT
SUT SILK 4 0 TIES 17X18 (SUTURE) IMPLANT
SUT VIC AB 4-0 SH 18 (SUTURE) ×2 IMPLANT
SUT VIC AB 5-0 P-3 18X BRD (SUTURE) IMPLANT
SUT VIC AB 5-0 P3 18 (SUTURE)
SWABSTICK POVIDONE IODINE SNGL (MISCELLANEOUS) ×4 IMPLANT
SYR CONTROL 10ML LL (SYRINGE) ×2 IMPLANT

## 2011-09-29 NOTE — H&P (Signed)
Chief Complaint: Sebaceous cyst  History of Present Illness: Paul [name] is an 66 y.o. male referred by Dr. Cato Mulligan with a sebaceous cyst. He has had a previous cyst on his back. He understands what these are. His his gets big and small but hasn't really been infected or drained according to him. He is diabetic.  I described removal at [facility] day surgery in the minor room. We will do this under local.glomus Moffitt saw her cough at  Past Medical History   Diagnosis  Date   .  Diabetes mellitus      type 2   .  Depression    .  Hypertension    .  Hyperlipidemia    .  Microalbuminuria     Past Surgical History   Procedure  Date   .  Tonsillectomy and adenoidectomy     Current Outpatient Prescriptions   Medication  Sig  Dispense  Refill   .  amLODipine (NORVASC) 10 MG tablet  TAKE 1 TABLET DAILY  90 tablet  3   .  atorvastatin (LIPITOR) 20 MG tablet  Take 1 tablet (20 mg total) by mouth daily.  90 tablet  3   .  B-D ULTRAFINE III SHORT PEN 31G X 8 MM MISC  USE ONCE DAILY  100 each  11   .  bumetanide (BUMEX) 2 MG tablet  TAKE 1 TABLET DAILY  90 tablet  3   .  FREESTYLE LITE test strip  USE AS DIRECTED ONCE A DAY  100 each  11   .  insulin aspart (NOVOLOG) 100 UNIT/ML injection  Inject 8 Units into the skin 3 (three) times daily before meals.  3 pen  0   .  insulin detemir (LEVEMIR FLEXPEN) 100 UNIT/ML injection  Inject 55 Units into the skin 2 (two) times daily.  9 mL  3   .  labetalol (NORMODYNE) 200 MG tablet  TAKE 2 TABLETS TWICE A DAY  200 tablet  3   .  Lancets (FREESTYLE) lancets  USE ONCE DAILY  100 each  11   .  losartan (COZAAR) 100 MG tablet  Take 1 tablet (100 mg total) by mouth daily.  90 tablet  3   .  meloxicam (MOBIC) 7.5 MG tablet  Take 1 tablet (7.5 mg total) by mouth daily.  30 tablet  0   .  metFORMIN (GLUCOPHAGE) 850 MG tablet  TAKE 1 TABLET TWICE A DAY WITH MEALS  180 tablet  3   .  PARoxetine (PAXIL) 20 MG tablet  TAKE 1 TABLET DAILY  90 tablet  3   .  potassium  chloride SA (K-DUR,KLOR-CON) 20 MEQ tablet  Take 1 tablet (20 mEq total) by mouth daily.  90 tablet  3   .  ramipril (ALTACE) 10 MG capsule  TAKE 1 CAPSULE DAILY  90 capsule  3    Review of patient's allergies indicates no known allergies.  Family History   Problem  Relation  Age of Onset   .  Cancer  Mother       breast    .  Heart attack  Mother    .  Hypertension  Father    .  Cerebral aneurysm  Father     Social History: reports that he quit smoking about 13 years ago. His smoking use included Cigars and Cigarettes. He does not have any smokeless tobacco history on file. He reports that he does not drink alcohol or use illicit  drugs.  REVIEW OF SYSTEMS - PERTINENT POSITIVES ONLY:  positive  Physical Exam:  Blood pressure 146/90, pulse 68, temperature 96.4 F (35.8 C), temperature source Temporal, resp. rate 14, height 5\' 6"  (1.676 m), weight 212 lb (96.163 kg).  Body mass index is 34.22 kg/(m^2).  Gen: WDWN WM NAD  Neurological: Alert and oriented to person, place, and time. Motor and sensory function is grossly intact  Head: Normocephalic and atraumatic.  Eyes: Conjunctivae are normal. Pupils are equal, round, and reactive to light. No scleral icterus.  Neck: Normal range of motion. Neck supple. No tracheal deviation or thyromegaly present. On the left side there is a 1 cm noninfected sebaceous cyst. No punctum identified  Cardiovascular: SR without murmurs or gallops. No carotid bruits  Respiratory: Effort normal. No respiratory distress. No chest wall tenderness. Breath sounds normal. No wheezes, rales or rhonchi.  Abdomen: nontender  GU:  Musculoskeletal: Normal range of motion. Extremities are nontender. No cyanosis, edema or clubbing noted Lymphadenopathy: No cervical, preauricular, postauricular or axillary adenopathy is present Skin: Skin is warm and dry. No rash noted. No diaphoresis. No erythema. No pallor. Pscyh: Normal mood and affect. Behavior is normal. Judgment and  thought content normal.  LABORATORY RESULTS:  No results found for this or any previous visit (from the past 48 hour(s)).  RADIOLOGY RESULTS:  No results found.  Problem List:  Patient Active Problem List   Diagnosis   .  DIABETES MELLITUS, TYPE II   .  HYPERLIPIDEMIA   .  GOUT   .  OBESITY   .  ANEMIA   .  DEPRESSION   .  HYPERTENSION   .  RETROGRADE EJACULATION   .  MICROALBUMINURIA   .  Sebaceous cyst-left neck    Assessment & Plan:  Noninfected sebacous cyst  Excision under local at [facility] Day Surgery  Matt B. Daphine Deutscher, MD, [facility]  [phone] beeper  [phone]

## 2011-09-29 NOTE — Op Note (Signed)
Surgeon: Wenda Low, MD, FACS  Asst:  none  Anes: Local 1%lido with epi and neut  Procedure: Excision of chronically inflamed sebaceous cyst of the left post neck  Diagnosis: Sebaceous cyst  Complications: none  EBL:   4 cc  Description of Procedure:  In OR 3 at [facility] as minor room.  Prepped and draped and timeout.  Infiltrated with local.  Elliptical skin incision and tedious dissection owing to the intense inflammation-chronic surrounding. Excised in toto.  Hemostasis with 4-0 vicryl and closed with 4-0 vicryl and Dermabond  Matt B. Daphine Deutscher, MD, [facility], [place] [phone]

## 2011-09-30 ENCOUNTER — Encounter (HOSPITAL_BASED_OUTPATIENT_CLINIC_OR_DEPARTMENT_OTHER): Payer: Self-pay | Admitting: Surgery

## 2011-10-15 ENCOUNTER — Other Ambulatory Visit: Payer: Self-pay | Admitting: Internal Medicine

## 2011-10-18 ENCOUNTER — Other Ambulatory Visit: Payer: 59

## 2011-10-20 ENCOUNTER — Encounter (INDEPENDENT_AMBULATORY_CARE_PROVIDER_SITE_OTHER): Payer: Self-pay | Admitting: Surgery

## 2011-10-20 ENCOUNTER — Ambulatory Visit (INDEPENDENT_AMBULATORY_CARE_PROVIDER_SITE_OTHER): Payer: 59 | Admitting: Surgery

## 2011-10-20 VITALS — BP 156/100 | HR 73 | Temp 98.0°F | Resp 18 | Ht 66.0 in | Wt 215.4 lb

## 2011-10-20 DIAGNOSIS — L723 Sebaceous cyst: Secondary | ICD-10-CM

## 2011-10-20 NOTE — Progress Notes (Signed)
Otelia Sergeant 66 y.o.  Body mass index is 34.77 kg/(m^2).  Patient Active Problem List  Diagnosis  . DIABETES MELLITUS, TYPE II  . HYPERLIPIDEMIA  . GOUT  . OBESITY  . ANEMIA  . DEPRESSION  . HYPERTENSION  . RETROGRADE EJACULATION  . MICROALBUMINURIA  . Sebaceous cyst-left neck    No Known Allergies  Past Surgical History  Procedure Date  . Tonsillectomy and adenoidectomy   . Mass excision 09/29/2011    Procedure: MINOR EXCISION OF MASS;  Surgeon: Valarie Merino, MD;  [facility];  Service: General;  Laterality: Left;  Excision sebaceous cyst on neck   Judie Petit, MD No diagnosis found.  Path showed sebaceous cyst.  Incision OK.  Return prn Matt B. Daphine Deutscher, MD, [facility] [phone] beeper [phone]  10/20/2011 4:18 PM

## 2011-10-25 ENCOUNTER — Ambulatory Visit: Payer: 59 | Admitting: Internal Medicine

## 2011-10-25 ENCOUNTER — Other Ambulatory Visit (INDEPENDENT_AMBULATORY_CARE_PROVIDER_SITE_OTHER): Payer: 59

## 2011-10-25 DIAGNOSIS — E119 Type 2 diabetes mellitus without complications: Secondary | ICD-10-CM

## 2011-10-25 LAB — BASIC METABOLIC PANEL
CO2: 30 mEq/L (ref 19–32)
Calcium: 10.1 mg/dL (ref 8.4–10.5)
Creatinine, Ser: 1.3 mg/dL (ref 0.4–1.5)
GFR: 57.29 mL/min — ABNORMAL LOW (ref >60.00)
Sodium: 138 mEq/L (ref 135–145)

## 2011-10-25 LAB — HEPATIC FUNCTION PANEL
AST: 38 U/L — ABNORMAL HIGH (ref 0–37)
Alkaline Phosphatase: 53 U/L (ref 39–117)
Bilirubin, Direct: 0.2 mg/dL (ref 0.0–0.3)

## 2011-10-25 LAB — LIPID PANEL
Cholesterol: 131 mg/dL (ref 0–200)
LDL Cholesterol: 66 mg/dL (ref 0–99)
Total CHOL/HDL Ratio: 4
Triglycerides: 146 mg/dL (ref 0.0–149.0)

## 2011-10-31 ENCOUNTER — Other Ambulatory Visit: Payer: Self-pay | Admitting: Internal Medicine

## 2011-11-01 ENCOUNTER — Ambulatory Visit (INDEPENDENT_AMBULATORY_CARE_PROVIDER_SITE_OTHER): Payer: 59 | Admitting: Internal Medicine

## 2011-11-01 ENCOUNTER — Encounter: Payer: Self-pay | Admitting: Internal Medicine

## 2011-11-01 VITALS — BP 142/84 | HR 72 | Temp 97.9°F | Wt 210.0 lb

## 2011-11-01 DIAGNOSIS — I10 Essential (primary) hypertension: Secondary | ICD-10-CM

## 2011-11-01 DIAGNOSIS — E119 Type 2 diabetes mellitus without complications: Secondary | ICD-10-CM

## 2011-11-01 DIAGNOSIS — E785 Hyperlipidemia, unspecified: Secondary | ICD-10-CM

## 2011-11-01 MED ORDER — RAMIPRIL 10 MG PO CAPS: 10.0000 mg | ORAL_CAPSULE | Freq: Every day | ORAL | Status: DC

## 2011-11-01 NOTE — Assessment & Plan Note (Signed)
Lab Results  Component Value Date   HGBA1C 8.3* 10/25/2011   Better, but not at goal- Advised weight loss.  Continue levemir  Lipids:  Lipid Panel     Component Value Date/Time   CHOL 131 10/25/2011 0838   TRIG 146.0 10/25/2011 0838   HDL 35.40* 10/25/2011 0838   CHOLHDL 4 10/25/2011 0838   VLDL 29.2 10/25/2011 0838   LDLCALC 66 10/25/2011 1610

## 2011-11-01 NOTE — Progress Notes (Signed)
Patient ID: Paul [name], male   DOB: February 14, 1945, 66 y.o.   MRN: [id number]   patient comes in for followup of multiple medical problems including type 2 diabetes, hyperlipidemia, hypertension. The patient does not check blood sugar or blood pressure at home. The patetient does not follow an exercise or diet program. The patient denies any polyuria, polydipsia.  In the past the patient has gone to diabetic treatment center. The patient is tolerating medications  Without difficulty. The patient does admit to medication compliance.   Past Medical History  Diagnosis Date  . Diabetes mellitus     type 2  . Depression   . Hypertension   . Hyperlipidemia   . Microalbuminuria     History   Social History  . Marital Status: Married    Spouse Name: N/A    Number of Children: N/A  . Years of Education: N/A   Occupational History  . Not on file.   Social History Main Topics  . Smoking status: Former Smoker    Types: Cigars, Cigarettes    Quit date: 09/13/1998  . Smokeless tobacco: Not on file  . Alcohol Use: No  . Drug Use: No  . Sexually Active:    Other Topics Concern  . Not on file   Social History Narrative  . No narrative on file    Past Surgical History  Procedure Date  . Tonsillectomy and adenoidectomy   . Mass excision 09/29/2011    Procedure: MINOR EXCISION OF MASS;  Surgeon: Valarie Merino, MD;  Location: [facility];  Service: General;  Laterality: Left;  Excision sebaceous cyst on neck    Family History  Problem Relation Age of Onset  . Cancer Mother     breast  . Heart attack Mother   . Hypertension Father   . Cerebral aneurysm Father     No Known Allergies  Current Outpatient Prescriptions on File Prior to Visit  Medication Sig Dispense Refill  . amLODipine (NORVASC) 10 MG tablet TAKE 1 TABLET DAILY  90 tablet  3  . atorvastatin (LIPITOR) 20 MG tablet TAKE 1 TABLET DAILY  90 tablet  1  . B-D ULTRAFINE III SHORT PEN 31G X 8 MM MISC USE  ONCE DAILY  100 each  11  . bumetanide (BUMEX) 2 MG tablet TAKE 1 TABLET DAILY  90 tablet  3  . FREESTYLE LITE test strip USE AS DIRECTED ONCE A DAY  100 each  11  . insulin detemir (LEVEMIR FLEXPEN) 100 UNIT/ML injection Inject 55 Units into the skin 2 (two) times daily.  9 mL  3  . labetalol (NORMODYNE) 200 MG tablet TAKE 2 TABLETS TWICE A DAY  200 tablet  3  . Lancets (FREESTYLE) lancets USE ONCE DAILY  100 each  11  . losartan (COZAAR) 100 MG tablet Take 1 tablet (100 mg total) by mouth daily.  90 tablet  3  . metFORMIN (GLUCOPHAGE) 850 MG tablet TAKE 1 TABLET TWICE A DAY  WITH MEALS  180 tablet  3  . PARoxetine (PAXIL) 20 MG tablet TAKE 1 TABLET DAILY  90 tablet  3  . potassium chloride SA (K-DUR,KLOR-CON) 20 MEQ tablet Take 1 tablet (20 mEq total) by mouth daily.  90 tablet  3  . ramipril (ALTACE) 10 MG capsule TAKE 1 CAPSULE DAILY  90 capsule  3  . DISCONTD: insulin aspart (NOVOLOG) 100 UNIT/ML injection Inject 8 Units into the skin 3 (three) times daily before meals.  3  pen  0     patient denies chest pain, shortness of breath, orthopnea. Denies lower extremity edema, abdominal pain, change in appetite, change in bowel movements. Patient denies rashes, musculoskeletal complaints. No other specific complaints in a complete review of systems.   BP 142/84  Pulse 72  Temp 97.9 F (36.6 C) (Oral)  Wt 210 lb (95.255 kg)   well-developed well-nourished male in no acute distress. HEENT exam atraumatic, normocephalic, neck supple without jugular venous distention. Chest clear to auscultation cardiac exam S1-S2 are regular. Abdominal exam overweight with bowel sounds, soft and nontender. Extremities no edema. Neurologic exam is alert with a normal gait. Marland Kitchen

## 2011-11-01 NOTE — Assessment & Plan Note (Signed)
He has not been taking ramipril Resume ramipril

## 2011-11-01 NOTE — Assessment & Plan Note (Signed)
Lipid Panel     Component Value Date/Time   CHOL 131 10/25/2011 0838   TRIG 146.0 10/25/2011 0838   HDL 35.40* 10/25/2011 0838   CHOLHDL 4 10/25/2011 0838   VLDL 29.2 10/25/2011 0838   LDLCALC 66 10/25/2011 0838   Controlled- continue same meds

## 2012-01-08 ENCOUNTER — Other Ambulatory Visit: Payer: Self-pay | Admitting: Internal Medicine

## 2012-01-14 ENCOUNTER — Other Ambulatory Visit: Payer: Self-pay | Admitting: Internal Medicine

## 2012-01-14 DIAGNOSIS — R634 Abnormal weight loss: Secondary | ICD-10-CM

## 2012-01-14 DIAGNOSIS — I10 Essential (primary) hypertension: Secondary | ICD-10-CM

## 2012-01-14 MED ORDER — HYDRALAZINE HCL 25 MG PO TABS: 25.0000 mg | ORAL_TABLET | Freq: Three times a day (TID) | ORAL | Status: DC

## 2012-02-03 ENCOUNTER — Encounter: Payer: 59 | Attending: Internal Medicine | Admitting: Dietician

## 2012-02-03 ENCOUNTER — Encounter: Payer: Self-pay | Admitting: Dietician

## 2012-02-03 VITALS — Ht 65.0 in | Wt 213.5 lb

## 2012-02-03 DIAGNOSIS — E119 Type 2 diabetes mellitus without complications: Secondary | ICD-10-CM | POA: Insufficient documentation

## 2012-02-03 DIAGNOSIS — Z713 Dietary counseling and surveillance: Secondary | ICD-10-CM | POA: Insufficient documentation

## 2012-02-03 NOTE — Progress Notes (Signed)
Medical Nutrition Therapy:  Appt start time: 0900 end time:  1000.   Assessment:  Primary concerns today: Came because the MD wanted him to see me. BP has "suddenly jumped up and he is having more frequent headaches."  Had in the past, had an exercise regimen and lost about 30 lb.  His work schedule became more hectic and he is not going to the gym.  He notes that he plans to retire at the end of March this year, and at that time he "will get bact to the gym, try to exercise more"  He notes that retirement will decrease a lot of his stress.  Currently his A1C was 8.3 (10/25/2011).  He notes that prior to that, he was 9% or more.  MEDICATIONS: Completed medication review.  DM medications include: Novolog and Levemir insulins  HYPOGLYCEMIA:  Denies S/S of low blood glucose.  HYPERGLYCEMIA:  Denies S/S of high blood glucose.  Notes that often, he will become sleepy in the 2-3 hours following a meal.  Usually a quick 15 minute nap will relieve it.    BLOOD GLUCOSE MONITORING: Monitors 1-2 times per week fasting levels.  Generally these are in the 110-140 range.     DIETARY INTAKE: 24-hr recall:  B ( AM):  Eggs, bacon 1 slice, small slice of quiche. Coffee, 1 cup with creamer or half and half. OR bagel on rare occassions.  Snk ( AM): no  L ( PM): vietamese restaruant veggie roll, dipped in peanut sauce and then veggies and chicken and a little white rice.  Shrimp and grits water Snk ( PM): sometimes a banana D ( PM): grill cheese sandwich with tomato with soup   OR beef stew with veggies (mixed) water Snk ( PM): hard cheese, and crackers  Chips a hoy, almond milk Beverages: water, coffee, rare diet soda  Usual physical activity: Currently no exercise regimen due to his work schedule.  He relates plans to get back to the gym at the end of March.  Encouraged him to aim for adding 15-20 minutes of walking into his daily regimen.  Mentioned that he needed this for glucose, stress and blood pressure.   Gently reminded him that he does not want to go wide-open working only to have the heart attack or stroke the first week of retirement.   Estimated energy needs: HT: 65 in  WT: 213.5 lb  BMI: 35.6 kg/m2  Adj WT: 165 lb (75 kg) 1600-1700 calories 180-185 g carbohydrates 120-125 g protein 44-46 g fat  Progress Towards Goal(s):  No progress.   Nutritional Diagnosis:  [place]-2.1 Inpaired nutrition utilization As related to blood glucose.  As evidenced by diagnosis of type 2 diabetes with A1C at 8.3%.    Intervention:  Nutrition Recommended: Carbs at 45-60 gm per meal.  Use lean meats and always have a protein at all meals and snacks.  Increase intake of non-starchy veggies.  Keep rice and pasta servings to 2/3-1 cup at meals. Use the calorieking.com web site for carb and calorie counts.  Try keeping a carb/calorie spread sheet for 2 weekdays and 1 weekend day along with daily blood glucose checks and correlate with meals.  Go to restaurant web sites for nutrient values prior to going out to eat and plan how you will restrict carbs.   Handouts given during visit include  Yellow card with diet prescription  List of Non-starchy vegetables  Controlling Blood Glucose  Diabetes and You by [employer]  for 45 and 60 gm Carb meals  Snack list  Monitoring/Evaluation:  Dietary intake, exercise, blood glucose, and body weight as you desire.Marland Kitchen

## 2012-02-06 ENCOUNTER — Encounter: Payer: Self-pay | Admitting: Dietician

## 2012-02-06 NOTE — Patient Instructions (Signed)
   Keep meals at 45-60 gm of carb at each meal.  Plan to have a lean protein at every meal and snack.  Continue with using the bake, broiled, grilled foods rather than the fried.  Increase your veggie intake even more.  Read the food label or use the CalorieKing web site for calorie and carb information.  Try to keep the cooked rice and pasta to 2/3-1 cup serving at meals.  Check your feet daily.  Continue to get your dilated eye exams.  Try to add 15-20 minutes of walking to your daily routine until you get back into the gym in April after your retirement at the end of March.  For sure, at retiring, get that exercise.

## 2012-02-11 ENCOUNTER — Encounter (INDEPENDENT_AMBULATORY_CARE_PROVIDER_SITE_OTHER): Payer: 59

## 2012-02-11 DIAGNOSIS — I10 Essential (primary) hypertension: Secondary | ICD-10-CM

## 2012-02-17 ENCOUNTER — Other Ambulatory Visit: Payer: Self-pay | Admitting: Internal Medicine

## 2012-02-17 DIAGNOSIS — N281 Cyst of kidney, acquired: Secondary | ICD-10-CM

## 2012-02-21 ENCOUNTER — Ambulatory Visit
Admission: RE | Admit: 2012-02-21 | Discharge: 2012-02-21 | Disposition: A | Discharge status: Home / Self Care | Payer: 59 | Source: Ambulatory Visit | Attending: Internal Medicine | Admitting: Internal Medicine

## 2012-02-21 DIAGNOSIS — N281 Cyst of kidney, acquired: Secondary | ICD-10-CM

## 2012-03-06 ENCOUNTER — Other Ambulatory Visit: Payer: Self-pay | Admitting: Internal Medicine

## 2012-03-09 ENCOUNTER — Other Ambulatory Visit: Payer: Self-pay | Admitting: *Deleted

## 2012-03-09 MED ORDER — LOSARTAN POTASSIUM 100 MG PO TABS: 100.0000 mg | ORAL_TABLET | Freq: Every day | ORAL | Status: DC

## 2012-03-09 MED ORDER — ATORVASTATIN CALCIUM 20 MG PO TABS: ORAL_TABLET | ORAL | Status: DC

## 2012-03-25 ENCOUNTER — Other Ambulatory Visit: Payer: Self-pay | Admitting: Internal Medicine

## 2012-04-12 ENCOUNTER — Telehealth: Payer: Self-pay | Admitting: Internal Medicine

## 2012-04-12 MED ORDER — INSULIN DETEMIR 100 UNIT/ML ~~LOC~~ SOLN: 55.0000 [IU] | Freq: Two times a day (BID) | SUBCUTANEOUS | Status: DC

## 2012-04-12 MED ORDER — INSULIN ASPART 100 UNIT/ML ~~LOC~~ SOLN: SUBCUTANEOUS | Status: DC

## 2012-04-12 NOTE — Telephone Encounter (Signed)
Patient called stating that he need a refill of his novolog flexpen and his levemir flexpen to be sent to a new pharmacy, [facility]. Please assist.

## 2012-04-12 NOTE — Telephone Encounter (Signed)
rx sent in electronically 

## 2012-04-24 ENCOUNTER — Other Ambulatory Visit (INDEPENDENT_AMBULATORY_CARE_PROVIDER_SITE_OTHER): Payer: 59

## 2012-04-24 DIAGNOSIS — I10 Essential (primary) hypertension: Secondary | ICD-10-CM

## 2012-04-24 DIAGNOSIS — E785 Hyperlipidemia, unspecified: Secondary | ICD-10-CM

## 2012-04-24 DIAGNOSIS — E119 Type 2 diabetes mellitus without complications: Secondary | ICD-10-CM

## 2012-04-24 LAB — BASIC METABOLIC PANEL
BUN: 19 mg/dL (ref 6–23)
CO2: 29 mEq/L (ref 19–32)
Glucose, Bld: 270 mg/dL — ABNORMAL HIGH (ref 70–99)
Potassium: 3.4 mEq/L — ABNORMAL LOW (ref 3.5–5.1)
Sodium: 139 mEq/L (ref 135–145)

## 2012-04-24 LAB — LIPID PANEL
HDL: 35.8 mg/dL — ABNORMAL LOW (ref >39.00)
LDL Cholesterol: 51 mg/dL (ref 0–99)
Total CHOL/HDL Ratio: 3
Triglycerides: 131 mg/dL (ref 0.0–149.0)
VLDL: 26.2 mg/dL (ref 0.0–40.0)

## 2012-04-24 LAB — HEPATIC FUNCTION PANEL: Albumin: 3.7 g/dL (ref 3.5–5.2)

## 2012-05-01 ENCOUNTER — Ambulatory Visit: Payer: 59 | Admitting: Internal Medicine

## 2012-05-01 ENCOUNTER — Encounter: Payer: Self-pay | Admitting: Internal Medicine

## 2012-05-01 ENCOUNTER — Ambulatory Visit (INDEPENDENT_AMBULATORY_CARE_PROVIDER_SITE_OTHER): Payer: 59 | Admitting: Internal Medicine

## 2012-05-01 VITALS — BP 144/92 | HR 88 | Temp 98.4°F | Ht 65.0 in | Wt 218.0 lb

## 2012-05-01 DIAGNOSIS — E119 Type 2 diabetes mellitus without complications: Secondary | ICD-10-CM

## 2012-05-01 DIAGNOSIS — E785 Hyperlipidemia, unspecified: Secondary | ICD-10-CM

## 2012-05-01 DIAGNOSIS — I10 Essential (primary) hypertension: Secondary | ICD-10-CM

## 2012-05-01 LAB — HM DIABETES FOOT EXAM

## 2012-05-01 MED ORDER — RAMIPRIL 10 MG PO CAPS: 10.0000 mg | ORAL_CAPSULE | Freq: Every day | ORAL | Status: DC

## 2012-05-01 MED ORDER — HYDRALAZINE HCL 50 MG PO TABS: 50.0000 mg | ORAL_TABLET | Freq: Three times a day (TID) | ORAL | Status: DC

## 2012-05-01 MED ORDER — POTASSIUM CHLORIDE CRYS ER 20 MEQ PO TBCR: 20.0000 meq | EXTENDED_RELEASE_TABLET | Freq: Every day | ORAL | Status: DC

## 2012-05-01 MED ORDER — AMLODIPINE BESYLATE 10 MG PO TABS: 10.0000 mg | ORAL_TABLET | Freq: Every day | ORAL | Status: DC

## 2012-05-01 MED ORDER — BUMETANIDE 2 MG PO TABS: 2.0000 mg | ORAL_TABLET | Freq: Every day | ORAL | Status: DC

## 2012-05-01 MED ORDER — LABETALOL HCL 200 MG PO TABS: 200.0000 mg | ORAL_TABLET | Freq: Two times a day (BID) | ORAL | Status: DC

## 2012-05-01 MED ORDER — LOSARTAN POTASSIUM 100 MG PO TABS: 100.0000 mg | ORAL_TABLET | Freq: Every day | ORAL | Status: DC

## 2012-05-01 MED ORDER — ATORVASTATIN CALCIUM 20 MG PO TABS: ORAL_TABLET | ORAL | Status: DC

## 2012-05-01 MED ORDER — PAROXETINE HCL 20 MG PO TABS: 20.0000 mg | ORAL_TABLET | Freq: Every day | ORAL | Status: DC

## 2012-05-01 MED ORDER — METFORMIN HCL 850 MG PO TABS: 850.0000 mg | ORAL_TABLET | Freq: Two times a day (BID) | ORAL | Status: DC

## 2012-05-01 NOTE — Assessment & Plan Note (Signed)
  Lipid Panel     Component Value Date/Time   CHOL 113 04/24/2012 0835   TRIG 131.0 04/24/2012 0835   HDL 35.80* 04/24/2012 0835   CHOLHDL 3 04/24/2012 0835   VLDL 26.2 04/24/2012 0835   LDLCALC 51 04/24/2012 0835    Controlled- continue same meds

## 2012-05-01 NOTE — Assessment & Plan Note (Signed)
Discussed weight gain and poor a1c. He has to take control of this disease Advised aggressive weight loss and vigorous exercise He is now retired and enthusiastic Labs 4 months

## 2012-05-01 NOTE — Assessment & Plan Note (Signed)
Still not controlled Note ace and arb Increase hydralazine

## 2012-05-01 NOTE — Progress Notes (Signed)
Patient ID: Paul [name], male   DOB: [birth year], 67 y.o.   MRN: [id number]  patient comes in for followup of multiple medical problems including type 2 diabetes, hyperlipidemia, hypertension. The patient does not check blood sugar or blood pressure at home. The patetient does not follow an exercise or diet program. The patient denies any polyuria, polydipsia.  In the past the patient has gone to diabetic treatment center. The patient is tolerating medications  Without difficulty. The patient does admit to medication compliance.   Past Medical History  Diagnosis Date  . Diabetes mellitus     type 2  . Depression   . Hypertension   . Hyperlipidemia   . Microalbuminuria     History   Social History  . Marital Status: Married    Spouse Name: N/A    Number of Children: N/A  . Years of Education: N/A   Occupational History  . Not on file.   Social History Main Topics  . Smoking status: Former Smoker    Types: Cigars, Cigarettes    Quit date: 09/13/1998  . Smokeless tobacco: Not on file  . Alcohol Use: No  . Drug Use: No  . Sexually Active:    Other Topics Concern  . Not on file   Social History Narrative  . No narrative on file    Past Surgical History  Procedure Laterality Date  . Tonsillectomy and adenoidectomy    . Mass excision  09/29/2011    Procedure: MINOR EXCISION OF MASS;  Surgeon: Valarie Merino, MD;  Location: [facility];  Service: General;  Laterality: Left;  Excision sebaceous cyst on neck    Family History  Problem Relation Age of Onset  . Cancer Mother     breast  . Heart attack Mother   . Hypertension Father   . Cerebral aneurysm Father     No Known Allergies  Current Outpatient Prescriptions on File Prior to Visit  Medication Sig Dispense Refill  . amLODipine (NORVASC) 10 MG tablet TAKE 1 TABLET DAILY  90 tablet  3  . atorvastatin (LIPITOR) 20 MG tablet TAKE 1 TABLET DAILY  90 tablet  3  . B-D ULTRAFINE III SHORT PEN 31G X 8  MM MISC USE ONCE DAILY  100 each  11  . bumetanide (BUMEX) 2 MG tablet TAKE 1 TABLET DAILY  90 tablet  3  . FREESTYLE LITE test strip USE AS DIRECTED ONCE A DAY  100 each  11  . hydrALAZINE (APRESOLINE) 25 MG tablet Take 1 tablet (25 mg total) by mouth 3 (three) times daily.  90 tablet  5  . insulin aspart (NOVOLOG FLEXPEN) 100 UNIT/ML injection INJECT 8 UNITS INTO THE SKIN 3 TIMES DAILY BEFORE MEALS  3 pen  1  . insulin detemir (LEVEMIR FLEXPEN) 100 UNIT/ML injection Inject 0.55 mLs (55 Units total) into the skin 2 (two) times daily.  9 mL  1  . KLOR-CON M20 20 MEQ tablet TAKE 1 TABLET BY MOUTH EVERY DAY  90 tablet  3  . labetalol (NORMODYNE) 200 MG tablet TAKE 2 TABLETS TWICE A DAY  200 tablet  3  . Lancets (FREESTYLE) lancets USE ONCE DAILY  100 each  11  . losartan (COZAAR) 100 MG tablet Take 1 tablet (100 mg total) by mouth daily.  90 tablet  3  . metFORMIN (GLUCOPHAGE) 850 MG tablet TAKE 1 TABLET TWICE A DAY  WITH MEALS  180 tablet  3  . PARoxetine (PAXIL) 20  MG tablet TAKE 1 TABLET DAILY  90 tablet  3  . ramipril (ALTACE) 10 MG capsule Take 1 capsule (10 mg total) by mouth daily.  90 capsule  3   No current facility-administered medications on file prior to visit.     patient denies chest pain, shortness of breath, orthopnea. Denies lower extremity edema, abdominal pain, change in appetite, change in bowel movements. Patient denies rashes, musculoskeletal complaints. No other specific complaints in a complete review of systems.   BP 144/92  Pulse 88  Temp(Src) 98.4 F (36.9 C) (Oral)  Ht 5\' 5"  (1.651 m)  Wt 218 lb (98.884 kg)  BMI 36.28 kg/m2  well-developed well-nourished male in no acute distress. HEENT exam atraumatic, normocephalic, neck supple without jugular venous distention. Chest clear to auscultation cardiac exam S1-S2 are regular. Abdominal exam overweight with bowel sounds, soft and nontender. Extremities no edema. Neurologic exam is alert with a normal gait.

## 2012-06-13 ENCOUNTER — Ambulatory Visit: Payer: Self-pay | Admitting: Internal Medicine

## 2012-07-27 ENCOUNTER — Other Ambulatory Visit: Payer: Self-pay

## 2012-08-31 ENCOUNTER — Other Ambulatory Visit (INDEPENDENT_AMBULATORY_CARE_PROVIDER_SITE_OTHER): Payer: Medicare Other

## 2012-08-31 DIAGNOSIS — E119 Type 2 diabetes mellitus without complications: Secondary | ICD-10-CM

## 2012-08-31 LAB — BASIC METABOLIC PANEL
BUN: 15 mg/dL (ref 6–23)
Calcium: 9.6 mg/dL (ref 8.4–10.5)
Creatinine, Ser: 1.2 mg/dL (ref 0.4–1.5)
GFR: 63.18 mL/min (ref >60.00)

## 2012-08-31 LAB — HEPATIC FUNCTION PANEL
ALT: 49 U/L (ref 0–53)
AST: 37 U/L (ref 0–37)
Albumin: 3.6 g/dL (ref 3.5–5.2)
Alkaline Phosphatase: 56 U/L (ref 39–117)
Bilirubin, Direct: 0.1 mg/dL (ref 0.0–0.3)
Total Protein: 6.9 g/dL (ref 6.0–8.3)

## 2012-08-31 LAB — LIPID PANEL
Cholesterol: 113 mg/dL (ref 0–200)
Triglycerides: 140 mg/dL (ref 0.0–149.0)

## 2012-08-31 LAB — HEMOGLOBIN A1C: Hgb A1c MFr Bld: 10.3 % — ABNORMAL HIGH (ref 4.6–6.5)

## 2012-09-07 ENCOUNTER — Other Ambulatory Visit: Payer: Self-pay | Admitting: Internal Medicine

## 2012-09-07 DIAGNOSIS — E119 Type 2 diabetes mellitus without complications: Secondary | ICD-10-CM

## 2012-09-22 ENCOUNTER — Ambulatory Visit (INDEPENDENT_AMBULATORY_CARE_PROVIDER_SITE_OTHER): Payer: Medicare Other | Admitting: Endocrinology

## 2012-09-22 ENCOUNTER — Encounter: Payer: Self-pay | Admitting: Endocrinology

## 2012-09-22 VITALS — BP 122/80 | HR 76 | Ht 66.0 in | Wt 210.0 lb

## 2012-09-22 DIAGNOSIS — E1029 Type 1 diabetes mellitus with other diabetic kidney complication: Secondary | ICD-10-CM

## 2012-09-22 MED ORDER — GLUCOSE BLOOD VI STRP: 1.0000 | ORAL_STRIP | Freq: Two times a day (BID) | Status: DC

## 2012-09-22 MED ORDER — INSULIN ASPART PROT & ASPART (70-30 MIX) 100 UNIT/ML PEN: 50.0000 [IU] | PEN_INJECTOR | Freq: Two times a day (BID) | SUBCUTANEOUS | Status: DC

## 2012-09-22 NOTE — Patient Instructions (Addendum)
good diet and exercise habits significanly improve the control of your diabetes.  please let me know if you wish to be referred to a dietician.  high blood sugar is very risky to your health.  you should see an eye doctor every year.  You are at higher than average risk for pneumonia and hepatitis-B.  You should be vaccinated against both.   controlling your blood pressure and cholesterol drastically reduces the damage diabetes does to your body.  this also applies to quitting smoking.  please discuss these with your doctor.  check your blood sugar twice a day.  vary the time of day when you check, between before the 3 meals, and at bedtime.  also check if you have symptoms of your blood sugar being too high or too low.  please keep a record of the readings and bring it to your next appointment here.  please call us sooner if your blood sugar goes below 70, or if you have a lot of readings over 200. On this type of insulin schedule, you should eat meals on a regular schedule.  If a meal is missed or significantly delayed, your blood sugar could go low. Please come back for a follow-up appointment in 2 weeks.   Please stop taking the metformin.

## 2012-09-22 NOTE — Progress Notes (Signed)
Subjective:    Patient ID: Paul [name], male    DOB: 10-03-1945, 67 y.o.   MRN: [id number]  HPI pt states 10 years h/o dm.  he has mild if any neuropathy of the lower extremities, but he has associated nephropathy.  he has been on insulin 5 years.  pt says his diet and exercise are much better.  He says he can't remember the lunch insulin.  Past Medical History  Diagnosis Date  . Diabetes mellitus     type 2  . Depression   . Hypertension   . Hyperlipidemia   . Microalbuminuria     Past Surgical History  Procedure Laterality Date  . Tonsillectomy and adenoidectomy    . Mass excision  09/29/2011    Procedure: MINOR EXCISION OF MASS;  Surgeon: Valarie Merino, MD;  Location: [facility];  Service: General;  Laterality: Left;  Excision sebaceous cyst on neck    History   Social History  . Marital Status: Married    Spouse Name: N/A    Number of Children: N/A  . Years of Education: N/A   Occupational History  . Not on file.   Social History Main Topics  . Smoking status: Former Smoker    Types: Cigars, Cigarettes    Quit date: 09/13/1998  . Smokeless tobacco: Not on file  . Alcohol Use: No  . Drug Use: No  . Sexual Activity:    Other Topics Concern  . Not on file   Social History Narrative  . No narrative on file    Current Outpatient Prescriptions on File Prior to Visit  Medication Sig Dispense Refill  . amLODipine (NORVASC) 10 MG tablet Take 1 tablet (10 mg total) by mouth daily.  90 tablet  3  . atorvastatin (LIPITOR) 20 MG tablet TAKE 1 TABLET DAILY  90 tablet  3  . B-D ULTRAFINE III SHORT PEN 31G X 8 MM MISC USE ONCE DAILY  100 each  11  . bumetanide (BUMEX) 2 MG tablet Take 1 tablet (2 mg total) by mouth daily.  90 tablet  3  . FREESTYLE LITE test strip USE AS DIRECTED ONCE A DAY  100 each  11  . hydrALAZINE (APRESOLINE) 50 MG tablet Take 1 tablet (50 mg total) by mouth 3 (three) times daily.  300 tablet  5  . insulin aspart (NOVOLOG  FLEXPEN) 100 UNIT/ML injection INJECT 8 UNITS INTO THE SKIN 3 TIMES DAILY BEFORE MEALS  3 pen  1  . insulin detemir (LEVEMIR FLEXPEN) 100 UNIT/ML injection Inject 0.55 mLs (55 Units total) into the skin 2 (two) times daily.  9 mL  1  . labetalol (NORMODYNE) 200 MG tablet Take 1 tablet (200 mg total) by mouth 2 (two) times daily.  200 tablet  3  . Lancets (FREESTYLE) lancets USE ONCE DAILY  100 each  11  . losartan (COZAAR) 100 MG tablet Take 1 tablet (100 mg total) by mouth daily.  90 tablet  3  . metFORMIN (GLUCOPHAGE) 850 MG tablet Take 1 tablet (850 mg total) by mouth 2 (two) times daily with a meal.  180 tablet  3  . PARoxetine (PAXIL) 20 MG tablet Take 1 tablet (20 mg total) by mouth daily.  90 tablet  3  . potassium chloride SA (KLOR-CON M20) 20 MEQ tablet Take 1 tablet (20 mEq total) by mouth daily.  90 tablet  3  . ramipril (ALTACE) 10 MG capsule Take 1 capsule (10 mg total) by  mouth daily.  90 capsule  3   No current facility-administered medications on file prior to visit.    No Known Allergies  Family History  Problem Relation Age of Onset  . Cancer Mother     breast  . Heart attack Mother   . Hypertension Father   . Cerebral aneurysm Father   DM: none  BP 122/80  Pulse 76  Ht 5\' 6"  (1.676 m)  Wt 210 lb (95.255 kg)  BMI 33.91 kg/m2  SpO2 98%  Review of Systems denies weight loss, blurry vision, headache, chest pain, sob, n/v, urinary frequency, cramps, excessive diaphoresis, memory loss, depression, rhinorrhea, and easy bruising.  Pt says he has intermittent hypoglycemia with activity, but he did not check then, so he is not sure.     Objective:   Physical Exam VS: see vs page GEN: no distress HEAD: head: no deformity eyes: no periorbital swelling, no proptosis external nose and ears are normal mouth: no lesion seen NECK: supple, thyroid is not enlarged CHEST WALL: no deformity LUNGS:  Clear to auscultation. CV: reg rate and rhythm, no murmur ABD: abdomen is  soft, nontender.  no hepatosplenomegaly.  not distended.  no hernia MUSCULOSKELETAL: muscle bulk and strength are grossly normal.  no obvious joint swelling.  gait is normal and steady PULSES: no carotid bruit NEURO:  cn 2-12 grossly intact.   readily moves all 4's.  SKIN:  Normal texture and temperature.  No rash or suspicious lesion is visible.   NODES:  None palpable at the neck PSYCH: alert, oriented x3.  Does not appear anxious nor depressed.  Lab Results  Component Value Date   HGBA1C 10.3* 08/31/2012      Assessment & Plan:  DM: This insulin regimen was chosen from multiple options, as it best matches his insulin to his changing requirements throughout the day.  The benefits of glycemic control must be weighed against the risks of hypoglycemia.   Depression: this complicates the rx of DM Obesity: this also complicates the rx of DM

## 2012-09-26 ENCOUNTER — Other Ambulatory Visit: Payer: Self-pay

## 2012-09-27 ENCOUNTER — Other Ambulatory Visit: Payer: Self-pay | Admitting: *Deleted

## 2012-09-27 MED ORDER — FREESTYLE LANCETS MISC: Status: DC

## 2012-09-29 ENCOUNTER — Other Ambulatory Visit: Payer: Self-pay | Admitting: *Deleted

## 2012-09-29 MED ORDER — ACCU-CHEK NANO SMARTVIEW W/DEVICE KIT: 1.0000 | PACK | Freq: Every day | Status: DC

## 2012-09-29 MED ORDER — INSULIN PEN NEEDLE 31G X 8 MM MISC: Status: DC

## 2012-10-02 ENCOUNTER — Other Ambulatory Visit: Payer: Self-pay | Admitting: *Deleted

## 2012-10-02 MED ORDER — INSULIN PEN NEEDLE 31G X 8 MM MISC: Status: DC

## 2012-10-06 ENCOUNTER — Ambulatory Visit (INDEPENDENT_AMBULATORY_CARE_PROVIDER_SITE_OTHER): Payer: Medicare Other | Admitting: Endocrinology

## 2012-10-06 ENCOUNTER — Encounter: Payer: Self-pay | Admitting: Endocrinology

## 2012-10-06 VITALS — BP 124/80 | HR 69 | Ht 64.0 in | Wt 211.0 lb

## 2012-10-06 DIAGNOSIS — E119 Type 2 diabetes mellitus without complications: Secondary | ICD-10-CM

## 2012-10-06 NOTE — Progress Notes (Signed)
Subjective:    Patient ID: Paul [name], male    DOB: 11-12-1945, 67 y.o.   MRN: [id number]  HPI pt returns for f/u of insulin-requiring DM (dx'ed 2004; he has mild if any neuropathy of the lower extremities, but he has associated nephropathy.  he has been on insulin since 2009; he takes bid premixed insulin, because he says he can't remember the lunch insulin).  He just started the new insulin yesterday.  no cbg record, but states cbg's are in the 100's.  There is no trend throughout the day.  pt states he feels well in general.   Past Medical History  Diagnosis Date  . Diabetes mellitus     type 2  . Depression   . Hypertension   . Hyperlipidemia   . Microalbuminuria     Past Surgical History  Procedure Laterality Date  . Tonsillectomy and adenoidectomy    . Mass excision  09/29/2011    Procedure: MINOR EXCISION OF MASS;  Surgeon: Valarie Merino, MD;  Location: [facility];  Service: General;  Laterality: Left;  Excision sebaceous cyst on neck    History   Social History  . Marital Status: Married    Spouse Name: N/A    Number of Children: N/A  . Years of Education: N/A   Occupational History  . Not on file.   Social History Main Topics  . Smoking status: Former Smoker    Types: Cigars, Cigarettes    Quit date: 09/13/1998  . Smokeless tobacco: Not on file  . Alcohol Use: No  . Drug Use: No  . Sexual Activity:    Other Topics Concern  . Not on file   Social History Narrative  . No narrative on file    Current Outpatient Prescriptions on File Prior to Visit  Medication Sig Dispense Refill  . amLODipine (NORVASC) 10 MG tablet Take 1 tablet (10 mg total) by mouth daily.  90 tablet  3  . atorvastatin (LIPITOR) 20 MG tablet TAKE 1 TABLET DAILY  90 tablet  3  . Blood Glucose Monitoring Suppl (ACCU-CHEK NANO SMARTVIEW) W/DEVICE KIT 1 each by Does not apply route daily.  1 kit  0  . bumetanide (BUMEX) 2 MG tablet Take 1 tablet (2 mg total) by mouth  daily.  90 tablet  3  . glucose blood (FREESTYLE TEST STRIPS) test strip 1 each by Other route 2 (two) times daily. And lancets 2/day 250.01  180 each  3  . hydrALAZINE (APRESOLINE) 50 MG tablet Take 1 tablet (50 mg total) by mouth 3 (three) times daily.  300 tablet  5  . Insulin Aspart Prot & Aspart (NOVOLOG MIX 70/30 FLEXPEN) (70-30) 100 UNIT/ML SUPN Inject 50 Units into the skin 2 (two) times daily with a meal. And pen needles 2/day  40 pen  3  . Insulin Pen Needle (B-D ULTRAFINE III SHORT PEN) 31G X 8 MM MISC USE 2 TIMES DAILY.  300 each  3  . labetalol (NORMODYNE) 200 MG tablet Take 1 tablet (200 mg total) by mouth 2 (two) times daily.  200 tablet  3  . Lancets (FREESTYLE) lancets USE ONCE DAILY  100 each  3  . LEVEMIR FLEXPEN 100 UNIT/ML SOPN       . losartan (COZAAR) 100 MG tablet Take 1 tablet (100 mg total) by mouth daily.  90 tablet  3  . PARoxetine (PAXIL) 20 MG tablet Take 1 tablet (20 mg total) by mouth daily.  90  tablet  3  . potassium chloride SA (KLOR-CON M20) 20 MEQ tablet Take 1 tablet (20 mEq total) by mouth daily.  90 tablet  3  . ramipril (ALTACE) 10 MG capsule Take 1 capsule (10 mg total) by mouth daily.  90 capsule  3   No current facility-administered medications on file prior to visit.   No Known Allergies  Family History  Problem Relation Age of Onset  . Cancer Mother     breast  . Heart attack Mother   . Hypertension Father   . Cerebral aneurysm Father    BP 124/80  Pulse 69  Ht 5\' 4"  (1.626 m)  Wt 211 lb (95.709 kg)  BMI 36.2 kg/m2  SpO2 95%  Review of Systems denies hypoglycemia and weight change.      Objective:   Physical Exam VITAL SIGNS:  See vs page.   GENERAL: no distress. SKIN:  Insulin injection sites at the anterior abdomen are normal.     Assessment & Plan:  DM: apparently well-controlled on new insulin. HTN: well-controlled.  This control mitigates the development of chronic complications.

## 2012-10-06 NOTE — Patient Instructions (Addendum)
check your blood sugar twice a day.  vary the time of day when you check, between before the 3 meals, and at bedtime.  also check if you have symptoms of your blood sugar being too high or too low.  please keep a record of the readings and bring it to your next appointment here.  please call us sooner if your blood sugar goes below 70, or if you have a lot of readings over 200. Please continue the same insulin.  On this type of insulin schedule, you should eat meals on a regular schedule.  If a meal is missed or significantly delayed, your blood sugar could go low. Please come back for a follow-up appointment in 1 month.

## 2012-10-31 ENCOUNTER — Ambulatory Visit (INDEPENDENT_AMBULATORY_CARE_PROVIDER_SITE_OTHER): Payer: Medicare Other | Admitting: Internal Medicine

## 2012-10-31 ENCOUNTER — Telehealth: Payer: Self-pay

## 2012-10-31 ENCOUNTER — Encounter: Payer: Self-pay | Admitting: Internal Medicine

## 2012-10-31 VITALS — BP 150/90 | HR 80 | Temp 98.7°F | Ht 64.0 in | Wt 214.0 lb

## 2012-10-31 DIAGNOSIS — E669 Obesity, unspecified: Secondary | ICD-10-CM

## 2012-10-31 DIAGNOSIS — Z23 Encounter for immunization: Secondary | ICD-10-CM

## 2012-10-31 DIAGNOSIS — E119 Type 2 diabetes mellitus without complications: Secondary | ICD-10-CM

## 2012-10-31 DIAGNOSIS — L910 Hypertrophic scar: Secondary | ICD-10-CM

## 2012-10-31 DIAGNOSIS — I10 Essential (primary) hypertension: Secondary | ICD-10-CM

## 2012-10-31 NOTE — Assessment & Plan Note (Signed)
He is on multiple drugs and states that bp at home is 140/80- Currently ramipril, losartan, amlodipine bumex, hydralazine--  The key to a lower bp is weight loss- he understands

## 2012-10-31 NOTE — Telephone Encounter (Signed)
Pt advised.

## 2012-10-31 NOTE — Assessment & Plan Note (Signed)
This is his most significant problem- The only way for him to address this is by eating less. Discussed at length

## 2012-10-31 NOTE — Assessment & Plan Note (Signed)
i doubt that he is making much progress because he has been unwilling to change calorie intake. Discussed low calorie diet.

## 2012-10-31 NOTE — Progress Notes (Signed)
Dm- reviewed a1c from august- poor control, Reviewed dr. George Hugh note- there is mention that new insulin regimen has resulted in much better control.  He is recently retired- not exercising or following a diet  Since getting shingles shot 2 years ago- he has noted a "lumP at the site.  Home BPs- 140/80  Reviewed pmh, psh, sochx, meds  Ros- patient denies chest pain, shortness of breath, orthopnea. Denies lower extremity edema, abdominal pain, change in appetite, change in bowel movements. Patient denies rashes, musculoskeletal complaints. No other specific complaints in a complete review of systems.   Reviewed vitals  obese male nad Chest cta cv reg rate abd- obese Derm- keloid (?) right deltoid 2 cm  A/p- Keloid ?? Will ask Derm to evaluate

## 2012-10-31 NOTE — Telephone Encounter (Signed)
Pt left vociemail stating cbgis over 200, it was 223, but has an appointment with you on /Monday, wait until Monday or adjust now [phone]

## 2012-10-31 NOTE — Telephone Encounter (Signed)
Please increase insulin to 60 units bid

## 2012-11-06 ENCOUNTER — Encounter: Payer: Self-pay | Admitting: Internal Medicine

## 2012-11-06 ENCOUNTER — Ambulatory Visit (INDEPENDENT_AMBULATORY_CARE_PROVIDER_SITE_OTHER): Payer: Medicare Other | Admitting: Endocrinology

## 2012-11-06 ENCOUNTER — Encounter: Payer: Self-pay | Admitting: Endocrinology

## 2012-11-06 VITALS — BP 134/84 | HR 81 | Temp 98.0°F | Wt 214.9 lb

## 2012-11-06 DIAGNOSIS — E119 Type 2 diabetes mellitus without complications: Secondary | ICD-10-CM

## 2012-11-06 DIAGNOSIS — E1029 Type 1 diabetes mellitus with other diabetic kidney complication: Secondary | ICD-10-CM

## 2012-11-06 NOTE — Progress Notes (Signed)
Subjective:    Patient ID: Paul [name], male    DOB: 02/15/1945, 67 y.o.   MRN: [id number]  HPI pt returns for f/u of insulin-requiring DM (dx'ed 2004; he has mild if any neuropathy of the lower extremities, but he has associated nephropathy; he has been on insulin since 2009; he takes bid premixed insulin, because he says he can't remember the lunch insulin).  He called last week to report high cbg's, and insulin was increased to 60 units bid. no cbg record, but states cbg's vary from 90-180.  There is no trend throughout the day, but it is lower with exertion.   Past Medical History  Diagnosis Date  . Diabetes mellitus     type 2  . Depression   . Hypertension   . Hyperlipidemia   . Microalbuminuria     Past Surgical History  Procedure Laterality Date  . Tonsillectomy and adenoidectomy    . Mass excision  09/29/2011    Procedure: MINOR EXCISION OF MASS;  Surgeon: Valarie Merino, MD;  Location: [facility];  Service: General;  Laterality: Left;  Excision sebaceous cyst on neck    History   Social History  . Marital Status: Married    Spouse Name: N/A    Number of Children: N/A  . Years of Education: N/A   Occupational History  . Not on file.   Social History Main Topics  . Smoking status: Former Smoker    Types: Cigars, Cigarettes    Quit date: 09/13/1998  . Smokeless tobacco: Not on file  . Alcohol Use: No  . Drug Use: No  . Sexual Activity:    Other Topics Concern  . Not on file   Social History Narrative  . No narrative on file    Current Outpatient Prescriptions on File Prior to Visit  Medication Sig Dispense Refill  . amLODipine (NORVASC) 10 MG tablet Take 1 tablet (10 mg total) by mouth daily.  90 tablet  3  . atorvastatin (LIPITOR) 20 MG tablet TAKE 1 TABLET DAILY  90 tablet  3  . Blood Glucose Monitoring Suppl (ACCU-CHEK NANO SMARTVIEW) W/DEVICE KIT 1 each by Does not apply route daily.  1 kit  0  . bumetanide (BUMEX) 2 MG tablet  Take 1 tablet (2 mg total) by mouth daily.  90 tablet  3  . glucose blood (FREESTYLE TEST STRIPS) test strip 1 each by Other route 2 (two) times daily. And lancets 2/day 250.01  180 each  3  . hydrALAZINE (APRESOLINE) 50 MG tablet Take 1 tablet (50 mg total) by mouth 3 (three) times daily.  300 tablet  5  . Insulin Pen Needle (B-D ULTRAFINE III SHORT PEN) 31G X 8 MM MISC USE 2 TIMES DAILY.  300 each  3  . labetalol (NORMODYNE) 200 MG tablet Take 1 tablet (200 mg total) by mouth 2 (two) times daily.  200 tablet  3  . losartan (COZAAR) 100 MG tablet Take 1 tablet (100 mg total) by mouth daily.  90 tablet  3  . PARoxetine (PAXIL) 20 MG tablet Take 1 tablet (20 mg total) by mouth daily.  90 tablet  3  . potassium chloride SA (KLOR-CON M20) 20 MEQ tablet Take 1 tablet (20 mEq total) by mouth daily.  90 tablet  3  . ramipril (ALTACE) 10 MG capsule Take 1 capsule (10 mg total) by mouth daily.  90 capsule  3   No current facility-administered medications on file prior to  visit.   No Known Allergies  Family History  Problem Relation Age of Onset  . Cancer Mother     breast  . Heart attack Mother   . Hypertension Father   . Cerebral aneurysm Father     BP 134/84  Pulse 81  Temp(Src) 98 F (36.7 C) (Oral)  Wt 214 lb 14.4 oz (97.478 kg)  BMI 36.87 kg/m2  SpO2 95%  Review of Systems denies hypoglycemia and weight change.      Objective:   Physical Exam VITAL SIGNS:  See vs page GENERAL: no distress   Lab Results  Component Value Date   HGBA1C 10.3* 08/31/2012      Assessment & Plan:  DM: apparently well-controlled on new insulin.  This insulin regimen was chosen from multiple options, for its simplicity.  The benefits of glycemic control must be weighed against the risks of hypoglycemia.  He needs to vary the insulin according to activity, when he can predict it. Obesity: this complicates the rx of DM.

## 2012-11-06 NOTE — Patient Instructions (Addendum)
check your blood sugar twice a day.  vary the time of day when you check, between before the 3 meals, and at bedtime.  also check if you have symptoms of your blood sugar being too high or too low.  please keep a record of the readings and bring it to your next appointment here.  please call us sooner if your blood sugar goes below 70, or if you have a lot of readings over 200. Please increase the insulin to 70 units with breakfast, and 60 units with the evening meal.  However, if you are going to be active, take just 50 units with breakfast that day. On this type of insulin schedule, you should eat meals on a regular schedule.  If a meal is missed or significantly delayed, your blood sugar could go low. Please come back for a follow-up appointment in 6 weeks.

## 2012-11-10 ENCOUNTER — Telehealth: Payer: Self-pay | Admitting: Endocrinology

## 2012-11-10 NOTE — Telephone Encounter (Signed)
Pt states cbg was 247 this am and 230 now

## 2012-11-10 NOTE — Telephone Encounter (Signed)
Pt advised.

## 2012-11-10 NOTE — Telephone Encounter (Signed)
Please increase the insulin to 80 units with breakfast, and 70 units with the evening meal. However, if you are going to be active, take just 50 units with breakfast that day. Please call if this doesn't help.

## 2012-11-16 ENCOUNTER — Telehealth: Payer: Self-pay | Admitting: *Deleted

## 2012-11-16 ENCOUNTER — Telehealth: Payer: Self-pay | Admitting: Endocrinology

## 2012-11-16 NOTE — Telephone Encounter (Signed)
Called pt and lvm advising him to please increase the insulin to 90 units with breakfast and 80 units with evening meal. However, if you're going to be active, take just 50 units with breakfast that day. Advised to call back with any questions.

## 2012-11-16 NOTE — Telephone Encounter (Signed)
Pt called stating that his AM readings have been high. 244, 235 and 277 the past 3 days. He is eating some at night. Pt states he woke up at 2 AM and ate a bowl of cereal in Almond Milk. Pt states he has been watching his carbs and trying to eat right. He states he has had cottage cheese and blueberries and his readings are high. He is concerned. He states that he's noticed his numbers higher since he has been off the metformin. Please advise.

## 2012-11-16 NOTE — Telephone Encounter (Signed)
Please increase the insulin to 90 units with breakfast, and 80 units with the evening meal. However, if you are going to be active, take just 50 units with breakfast that day.

## 2012-12-18 ENCOUNTER — Ambulatory Visit (INDEPENDENT_AMBULATORY_CARE_PROVIDER_SITE_OTHER): Payer: Medicare Other | Admitting: Endocrinology

## 2012-12-18 VITALS — BP 124/76 | HR 82 | Temp 98.6°F | Ht 64.0 in | Wt 216.3 lb

## 2012-12-18 DIAGNOSIS — E119 Type 2 diabetes mellitus without complications: Secondary | ICD-10-CM

## 2012-12-18 DIAGNOSIS — Z85820 Personal history of malignant melanoma of skin: Secondary | ICD-10-CM | POA: Insufficient documentation

## 2012-12-18 HISTORY — PX: MELANOMA EXCISION: SHX5266

## 2012-12-18 LAB — MICROALBUMIN / CREATININE URINE RATIO
Creatinine,U: 26.2 mg/dL
Microalb, Ur: 2.9 mg/dL — ABNORMAL HIGH (ref 0.0–1.9)

## 2012-12-18 NOTE — Progress Notes (Signed)
Subjective:    Patient ID: Paul [name], male    DOB: 09/27/1945, 66 y.o.   MRN: [id number]  HPI pt returns for f/u of insulin-requiring DM (dx'ed 2004, on a routine blood test; he has mild if any neuropathy of the lower extremities, but he has associated nephropathy; he has been on insulin since 2009; he takes bid premixed insulin, because he says he can't remember the lunch insulin; he has never had severe hypoglycemia or DKA).  Insulin has been increased several times since last ov, as pt has called to report high cbg's.   However, he does not recently check cbg's.  However, he says glucose is going low during the day, if he takes the full 90 units qam.   Past Medical History  Diagnosis Date  . Diabetes mellitus     type 2  . Depression   . Hypertension   . Hyperlipidemia   . Microalbuminuria     Past Surgical History  Procedure Laterality Date  . Tonsillectomy and adenoidectomy    . Mass excision  09/29/2011    Procedure: MINOR EXCISION OF MASS;  Surgeon: Valarie Merino, MD;  Location: [facility];  Service: General;  Laterality: Left;  Excision sebaceous cyst on neck    History   Social History  . Marital Status: Married    Spouse Name: N/A    Number of Children: N/A  . Years of Education: N/A   Occupational History  . Not on file.   Social History Main Topics  . Smoking status: Former Smoker    Types: Cigars, Cigarettes    Quit date: 09/13/1998  . Smokeless tobacco: Not on file  . Alcohol Use: No  . Drug Use: No  . Sexual Activity:    Other Topics Concern  . Not on file   Social History Narrative  . No narrative on file    Current Outpatient Prescriptions on File Prior to Visit  Medication Sig Dispense Refill  . amLODipine (NORVASC) 10 MG tablet Take 1 tablet (10 mg total) by mouth daily.  90 tablet  3  . atorvastatin (LIPITOR) 20 MG tablet TAKE 1 TABLET DAILY  90 tablet  3  . Blood Glucose Monitoring Suppl (ACCU-CHEK NANO SMARTVIEW)  W/DEVICE KIT 1 each by Does not apply route daily.  1 kit  0  . bumetanide (BUMEX) 2 MG tablet Take 1 tablet (2 mg total) by mouth daily.  90 tablet  3  . glucose blood (FREESTYLE TEST STRIPS) test strip 1 each by Other route 2 (two) times daily. And lancets 2/day 250.01  180 each  3  . hydrALAZINE (APRESOLINE) 50 MG tablet Take 1 tablet (50 mg total) by mouth 3 (three) times daily.  300 tablet  5  . Insulin Aspart Prot & Aspart (70-30) 100 UNIT/ML SUPN Inject 80 Units into the skin 2 (two) times daily with a meal. and pen needles 2/day      . Insulin Pen Needle (B-D ULTRAFINE III SHORT PEN) 31G X 8 MM MISC USE 2 TIMES DAILY.  300 each  3  . labetalol (NORMODYNE) 200 MG tablet Take 1 tablet (200 mg total) by mouth 2 (two) times daily.  200 tablet  3  . losartan (COZAAR) 100 MG tablet Take 1 tablet (100 mg total) by mouth daily.  90 tablet  3  . PARoxetine (PAXIL) 20 MG tablet Take 1 tablet (20 mg total) by mouth daily.  90 tablet  3  . potassium  chloride SA (KLOR-CON M20) 20 MEQ tablet Take 1 tablet (20 mEq total) by mouth daily.  90 tablet  3  . ramipril (ALTACE) 10 MG capsule Take 1 capsule (10 mg total) by mouth daily.  90 capsule  3   No current facility-administered medications on file prior to visit.   No Known Allergies  Family History  Problem Relation Age of Onset  . Cancer Mother     breast  . Heart attack Mother   . Hypertension Father   . Cerebral aneurysm Father     BP 124/76  Pulse 82  Temp(Src) 98.6 F (37 C) (Oral)  Ht 5\' 4"  (1.626 m)  Wt 216 lb 5 oz (98.119 kg)  BMI 37.11 kg/m2  SpO2 94%  Review of Systems Denies LOC and weight change.      Objective:   Physical Exam VITAL SIGNS:  See vs page GENERAL: no distress  Lab Results  Component Value Date   HGBA1C 8.6* 12/18/2012      Assessment & Plan:  DM: control is improved, but this improvement is not yet reflected on a1c  This insulin regimen was chosen from multiple options, for its simplicity.  The  benefits of glycemic control must be weighed against the risks of hypoglycemia.  He needs to vary the insulin according to activity, when he can predict it. Noncompliance with self-glucose monitoring.  i'll do the best i can. Obesity: this complicates the rx of DM.

## 2012-12-18 NOTE — Patient Instructions (Addendum)
check your blood sugar twice a day.  vary the time of day when you check, between before the 3 meals, and at bedtime.  also check if you have symptoms of your blood sugar being too high or too low.  please keep a record of the readings and bring it to your next appointment here.  please call us sooner if your blood sugar goes below 70, or if you have a lot of readings over 200.   blood tests are being requested for you today.  We'll contact you with results. Please continue the insulin, 80 units with breakfast, and 80 units with the evening meal.  However, if you are going to be active, take just 50 units with breakfast that day.   On this type of insulin schedule, you should eat meals on a regular schedule.  If a meal is missed or significantly delayed, your blood sugar could go low. Please come back for a follow-up appointment in 3 months.

## 2013-01-08 NOTE — Progress Notes (Signed)
The patients AIC results were released to My Chart on 12-18-12 and reviewed by the patient on 12/27/12.

## 2013-03-09 ENCOUNTER — Other Ambulatory Visit: Payer: Self-pay | Admitting: Endocrinology

## 2013-04-30 ENCOUNTER — Encounter: Payer: Self-pay | Admitting: Internal Medicine

## 2013-04-30 ENCOUNTER — Ambulatory Visit (INDEPENDENT_AMBULATORY_CARE_PROVIDER_SITE_OTHER): Payer: Medicare Other | Admitting: Internal Medicine

## 2013-04-30 VITALS — BP 138/80 | HR 68 | Temp 98.1°F | Ht 64.0 in | Wt 224.0 lb

## 2013-04-30 DIAGNOSIS — R609 Edema, unspecified: Secondary | ICD-10-CM

## 2013-04-30 DIAGNOSIS — E785 Hyperlipidemia, unspecified: Secondary | ICD-10-CM

## 2013-04-30 DIAGNOSIS — E119 Type 2 diabetes mellitus without complications: Secondary | ICD-10-CM

## 2013-04-30 DIAGNOSIS — C439 Malignant melanoma of skin, unspecified: Secondary | ICD-10-CM

## 2013-04-30 LAB — BASIC METABOLIC PANEL
BUN: 20 mg/dL (ref 6–23)
CO2: 28 mEq/L (ref 19–32)
CREATININE: 1.4 mg/dL (ref 0.4–1.5)
Calcium: 9.4 mg/dL (ref 8.4–10.5)
Chloride: 101 mEq/L (ref 96–112)
GFR: 55.11 mL/min — AB (ref >60.00)
GLUCOSE: 175 mg/dL — AB (ref 70–99)
Potassium: 3.5 mEq/L (ref 3.5–5.1)
Sodium: 139 mEq/L (ref 135–145)

## 2013-04-30 LAB — HEPATIC FUNCTION PANEL
ALBUMIN: 3.8 g/dL (ref 3.5–5.2)
ALT: 47 U/L (ref 0–53)
AST: 31 U/L (ref 0–37)
Alkaline Phosphatase: 66 U/L (ref 39–117)
Bilirubin, Direct: 0 mg/dL (ref 0.0–0.3)
Total Bilirubin: 0.7 mg/dL (ref 0.3–1.2)
Total Protein: 7.4 g/dL (ref 6.0–8.3)

## 2013-04-30 LAB — MICROALBUMIN / CREATININE URINE RATIO
Creatinine,U: 191.4 mg/dL
Microalb Creat Ratio: 21.9 mg/g (ref 0.0–30.0)
Microalb, Ur: 41.9 mg/dL — ABNORMAL HIGH (ref 0.0–1.9)

## 2013-04-30 LAB — HEMOGLOBIN A1C: Hgb A1c MFr Bld: 9.5 % — ABNORMAL HIGH (ref 4.6–6.5)

## 2013-04-30 LAB — BRAIN NATRIURETIC PEPTIDE: Pro B Natriuretic peptide (BNP): 54 pg/mL (ref 0.0–100.0)

## 2013-04-30 LAB — LIPID PANEL
CHOLESTEROL: 157 mg/dL (ref 0–200)
HDL: 38.8 mg/dL — AB (ref >39.00)
LDL Cholesterol: 87 mg/dL (ref 0–99)
TRIGLYCERIDES: 157 mg/dL — AB (ref 0.0–149.0)
Total CHOL/HDL Ratio: 4
VLDL: 31.4 mg/dL (ref 0.0–40.0)

## 2013-04-30 LAB — HM DIABETES FOOT EXAM

## 2013-04-30 NOTE — Patient Instructions (Signed)
Stop amlodipine  Blood pressure check in 1-2 weeks with nurse

## 2013-04-30 NOTE — Progress Notes (Signed)
Pre visit review using our clinic review tool, if applicable. No additional management support is needed unless otherwise documented below in the visit note. 

## 2013-04-30 NOTE — Progress Notes (Signed)
Mood- states he is depressed. He thinks from ED  Melanoma- follwed by dermatology  ED- cialis - will refill  DM2- no home cbgs. He is followed by endocrinology Lab Results  Component Value Date   HGBA1C 8.6* 12/18/2012   Lipids- toleating meds  Edema- he states swelling in LE has worsened past 3 weeks.   Past Medical History  Diagnosis Date  . Diabetes mellitus     type 2  . Depression   . Hypertension   . Hyperlipidemia   . Microalbuminuria     History   Social History  . Marital Status: Married    Spouse Name: N/A    Number of Children: N/A  . Years of Education: N/A   Occupational History  . Not on file.   Social History Main Topics  . Smoking status: Former Smoker    Types: Cigars, Cigarettes    Quit date: 09/13/1998  . Smokeless tobacco: Not on file  . Alcohol Use: No  . Drug Use: No  . Sexual Activity:    Other Topics Concern  . Not on file   Social History Narrative  . No narrative on file    Past Surgical History  Procedure Laterality Date  . Tonsillectomy and adenoidectomy    . Mass excision  09/29/2011    Procedure: MINOR EXCISION OF MASS;  Surgeon: Pedro Earls, MD;  Location: [place];  Service: General;  Laterality: Left;  Excision sebaceous cyst on neck    Family History  Problem Relation Age of Onset  . Cancer Mother     breast  . Heart attack Mother   . Hypertension Father   . Cerebral aneurysm Father     Allergies  Allergen Reactions  . Amlodipine     ? swelling    Current Outpatient Prescriptions on File Prior to Visit  Medication Sig Dispense Refill  . atorvastatin (LIPITOR) 20 MG tablet TAKE 1 TABLET DAILY  90 tablet  3  . Blood Glucose Monitoring Suppl (ACCU-CHEK NANO SMARTVIEW) W/DEVICE KIT 1 each by Does not apply route daily.  1 kit  0  . bumetanide (BUMEX) 2 MG tablet Take 1 tablet (2 mg total) by mouth daily.  90 tablet  3  . glucose blood (FREESTYLE TEST STRIPS) test strip 1 each by Other  route 2 (two) times daily. And lancets 2/day 250.01  180 each  3  . hydrALAZINE (APRESOLINE) 50 MG tablet Take 1 tablet (50 mg total) by mouth 3 (three) times daily.  300 tablet  5  . Insulin Aspart Prot & Aspart (70-30) 100 UNIT/ML SUPN Inject 80 Units into the skin 2 (two) times daily with a meal. and pen needles 2/day      . Insulin Pen Needle (B-D ULTRAFINE III SHORT PEN) 31G X 8 MM MISC USE 2 TIMES DAILY.  300 each  3  . labetalol (NORMODYNE) 200 MG tablet Take 1 tablet (200 mg total) by mouth 2 (two) times daily.  200 tablet  3  . losartan (COZAAR) 100 MG tablet Take 1 tablet (100 mg total) by mouth daily.  90 tablet  3  . NOVOLOG MIX 70/30 FLEXPEN (70-30) 100 UNIT/ML Pen Inject subcutaneously 50  units two times daily with  a meal  90 mL  3  . PARoxetine (PAXIL) 20 MG tablet Take 1 tablet (20 mg total) by mouth daily.  90 tablet  3  . potassium chloride SA (KLOR-CON M20) 20 MEQ tablet Take 1 tablet (20  mEq total) by mouth daily.  90 tablet  3  . ramipril (ALTACE) 10 MG capsule Take 1 capsule (10 mg total) by mouth daily.  90 capsule  3   No current facility-administered medications on file prior to visit.     patient denies chest pain, shortness of breath, orthopnea. Denies lower extremity edema, abdominal pain, change in appetite, change in bowel movements. Patient denies rashes, musculoskeletal complaints. No other specific complaints in a complete review of systems.   BP 138/80  Pulse 68  Temp(Src) 98.1 F (36.7 C) (Oral)  Ht $R'5\' 4"'Fm$  (1.626 m)  Wt 224 lb (101.606 kg)  BMI 38.43 kg/m2  well-developed well-nourished male in no acute distress. HEENT exam atraumatic, normocephalic, neck supple without jugular venous distention. Chest clear to auscultation cardiac exam S1-S2 are regular. Abdominal exam overweight with bowel sounds, soft and nontender. Extremities no edema. Neurologic exam is alert with a normal gait.

## 2013-05-02 NOTE — Assessment & Plan Note (Signed)
Continue same meds Lipid Panel     Component Value Date/Time   CHOL 157 04/30/2013 1026   TRIG 157.0* 04/30/2013 1026   HDL 38.80* 04/30/2013 1026   CHOLHDL 4 04/30/2013 1026   VLDL 31.4 04/30/2013 1026   LDLCALC 87 04/30/2013 1026

## 2013-05-02 NOTE — Assessment & Plan Note (Signed)
Has regular f/u with dermatology.

## 2013-05-02 NOTE — Assessment & Plan Note (Signed)
Lab Results  Component Value Date   HGBA1C 9.5* 04/30/2013   Terrible control- he has f/u with endocrinology

## 2013-05-04 ENCOUNTER — Ambulatory Visit: Payer: Medicare Other | Admitting: Internal Medicine

## 2013-05-10 ENCOUNTER — Encounter: Payer: Self-pay | Admitting: Physician Assistant

## 2013-05-10 ENCOUNTER — Ambulatory Visit (INDEPENDENT_AMBULATORY_CARE_PROVIDER_SITE_OTHER): Payer: Medicare Other | Admitting: Physician Assistant

## 2013-05-10 ENCOUNTER — Telehealth: Payer: Self-pay

## 2013-05-10 VITALS — BP 162/90 | HR 71 | Temp 98.3°F | Resp 16 | Wt 222.0 lb

## 2013-05-10 DIAGNOSIS — I10 Essential (primary) hypertension: Secondary | ICD-10-CM

## 2013-05-10 DIAGNOSIS — J069 Acute upper respiratory infection, unspecified: Secondary | ICD-10-CM

## 2013-05-10 MED ORDER — LABETALOL HCL 300 MG PO TABS: 300.0000 mg | ORAL_TABLET | Freq: Two times a day (BID) | ORAL | Status: DC

## 2013-05-10 NOTE — Telephone Encounter (Signed)
Pt was taking off amlodipine at last visit.  Pt's bp was 194/93 a few minutes ago.  Per pt is has been as high 186/105, 195/101 and low as 173/86.  Pt states Dr. Leanne Chang wanted him to come in and check blood pressure. Pt is concerned and wanted to make the office aware of his readings.  Discussed briefly with Kela Millin and he will see patient today at 1 pm. Pt is aware.

## 2013-05-10 NOTE — Patient Instructions (Signed)
Change labetalol to 300mg  tabs by mouth twice per day.  Mucinex DM for respiratory cough symptoms.  Drink plenty of Fluids. Low salt diet.  Followup if symptoms worsen or do not improve despite treatment.  Sodium-Controlled Diet Sodium is a mineral. It is found in many foods. Sodium may be found naturally or added during the making of a food. The most common form of sodium is salt, which is made up of sodium and chloride. Reducing your sodium intake involves changing your eating habits. The following guidelines will help you reduce the sodium in your diet:  Stop using the salt shaker.  Use salt sparingly in cooking and baking.  Substitute with sodium-free seasonings and spices.  Do not use a salt substitute (potassium chloride) without your caregiver's permission.  Include a variety of fresh, unprocessed foods in your diet.  Limit the use of processed and convenience foods that are high in sodium. USE THE FOLLOWING FOODS SPARINGLY: Breads/Starches  Commercial bread stuffing, commercial pancake or waffle mixes, coating mixes. Waffles. Croutons. Prepared (boxed or frozen) potato, rice, or noodle mixes that contain salt or sodium. Salted [place] fries or hash browns. Salted popcorn, breads, crackers, chips, or snack foods. Vegetables  Vegetables canned with salt or prepared in cream, butter, or cheese sauces. Sauerkraut. Tomato or vegetable juices canned with salt.  Fresh vegetables are allowed if rinsed thoroughly. Fruit  Fruit is okay to eat. Meat and Meat Substitutes  Salted or smoked meats, such as bacon or Canadian bacon, chipped or corned beef, hot dogs, salt pork, luncheon meats, pastrami, ham, or sausage. Canned or smoked fish, poultry, or meat. Processed cheese or cheese spreads, blue or Roquefort cheese. Battered or frozen fish products. Prepared spaghetti sauce. Baked beans. Reuben sandwiches. Salted nuts. Caviar. Milk  Limit buttermilk to 1 cup per week. Soups and  Combination Foods  Bouillon cubes, canned or dried soups, broth, consomm. Convenience (frozen or packaged) dinners with more than 600 mg sodium. Pot pies, pizza, Asian food, fast food cheeseburgers, and specialty sandwiches. Desserts and Sweets  Regular (salted) desserts, pie, commercial fruit snack pies, commercial snack cakes, canned puddings.  Eat desserts and sweets in moderation. Fats and Oils  Gravy mixes or canned gravy. No more than 1 to 2 tbs of salad dressing. Chip dips.  Eat fats and oils in moderation. Beverages  See those listed under the vegetables and milk groups. Condiments  Ketchup, mustard, meat sauces, salsa, regular (salted) and lite soy sauce or mustard. Dill pickles, olives, meat tenderizer. Prepared horseradish or pickle relish. Dutch-processed cocoa. Baking powder or baking soda used medicinally. Worcestershire sauce. "Light" salt. Salt substitute, unless approved by your caregiver. Document Released: 06/26/2001 Document Revised: 03/29/2011 Document Reviewed: 01/27/2009 [facility] Patient Information 2014 [place], [place].

## 2013-05-10 NOTE — Progress Notes (Signed)
Subjective:    Patient ID: Paul [name], male    DOB: 03-14-1945, 68 y.o.   MRN: [id number]  Hypertension   Patient is a 68 year old Caucasian male with a history of hypertension presenting today for high blood pressure reading. Patient was seen on 04/30/2013 for a six-month followup and it was discovered that he had a significant amount of lower extremity edema. His amlodipine was then discontinued as suspect cause of the edema. Today he was concerned because his blood pressure cuff gave many high readings, the highest of which was 627 systolic. He has a blood pressure reading appointment scheduled for this coming Monday, however he felt unsure if he should wait the weekend to be seen with his blood pressure that high. His lower extremity he edema has gone down over the past week. He denies fevers, chills, nausea vomiting diarrhea, shortness of breath, dizziness.   Review of Systems As per history of present illness and are otherwise negative.  Past Medical History  Diagnosis Date  . Diabetes mellitus     type 2  . Depression   . Hypertension   . Hyperlipidemia   . Microalbuminuria    Past Surgical History  Procedure Laterality Date  . Tonsillectomy and adenoidectomy    . Mass excision  09/29/2011    Procedure: MINOR EXCISION OF MASS;  Surgeon: Pedro Earls, MD;  Location: [place];  Service: General;  Laterality: Left;  Excision sebaceous cyst on neck    reports that he quit smoking about 14 years ago. His smoking use included Cigars and Cigarettes. He smoked 0.00 packs per day. He does not have any smokeless tobacco history on file. He reports that he does not drink alcohol or use illicit drugs. family history includes Cancer in his mother; Cerebral aneurysm in his father; Heart attack in his mother; Hypertension in his father. Allergies  Allergen Reactions  . Amlodipine     ? swelling       Objective:   Physical Exam  Nursing note and vitals  reviewed. Constitutional: He is oriented to person, place, and time. He appears well-developed and well-nourished. No distress.  HENT:  Head: Normocephalic and atraumatic.  Right Ear: External ear normal.  Left Ear: External ear normal.  Eyes: Conjunctivae and EOM are normal. Pupils are equal, round, and reactive to light.  Neck: Normal range of motion. Neck supple. No JVD present. No tracheal deviation present. No thyromegaly present.  Cardiovascular: Normal rate, regular rhythm, normal heart sounds and intact distal pulses.  Exam reveals no gallop and no friction rub.   No murmur heard. Pulmonary/Chest: Effort normal. No stridor. No respiratory distress. He has no wheezes. He has no rales. He exhibits no tenderness.  Clearing rhonchi heard on auscultation in lung bases.  Abdominal: Soft. Bowel sounds are normal. There is no tenderness.  Musculoskeletal: Normal range of motion. He exhibits no edema.  Lymphadenopathy:    He has no cervical adenopathy.  Neurological: He is alert and oriented to person, place, and time. He has normal reflexes. No cranial nerve deficit. Coordination normal.  Skin: Skin is warm and dry. No rash noted. He is not diaphoretic. No erythema. No pallor.  Psychiatric: He has a normal mood and affect. His behavior is normal. Judgment and thought content normal.   Filed Vitals:   05/10/13 1306  BP: 150/80  Pulse: 71  Temp: 98.3 F (36.8 C)  Resp: 16   Filed Vitals:   05/10/13 1409  BP: 162/90  Pulse:   Temp:   Resp:      Lab Results  Component Value Date   WBC 8.5 04/19/2011   HGB 14.1 04/19/2011   HCT 42.1 04/19/2011   PLT 179.0 04/19/2011   GLUCOSE 175* 04/30/2013   CHOL 157 04/30/2013   TRIG 157.0* 04/30/2013   HDL 38.80* 04/30/2013   LDLCALC 87 04/30/2013   ALT 47 04/30/2013   AST 31 04/30/2013   NA 139 04/30/2013   K 3.5 04/30/2013   CL 101 04/30/2013   CREATININE 1.4 04/30/2013   BUN 20 04/30/2013   CO2 28 04/30/2013   HGBA1C 9.5* 04/30/2013   MICROALBUR  41.9* 04/30/2013      Assessment & Plan:  [facility] was seen today for hypertension.  Diagnoses and associated orders for this visit:  Uncontrolled hypertension - labetalol (NORMODYNE) 300 MG tablet; Take 1 tablet (300 mg total) by mouth 2 (two) times daily.  URI, acute    Patient Instructions  Change labetalol to 300mg  tabs by mouth twice per day.  Mucinex DM for respiratory cough symptoms.  Drink plenty of Fluids. Low salt diet.  Followup if symptoms worsen or do not improve despite treatment.

## 2013-05-10 NOTE — Progress Notes (Signed)
Pre visit review using our clinic review tool, if applicable. No additional management support is needed unless otherwise documented below in the visit note. 

## 2013-05-14 ENCOUNTER — Ambulatory Visit: Payer: Medicare Other | Admitting: Physician Assistant

## 2013-05-14 ENCOUNTER — Ambulatory Visit (INDEPENDENT_AMBULATORY_CARE_PROVIDER_SITE_OTHER): Payer: Medicare Other | Admitting: Internal Medicine

## 2013-05-14 ENCOUNTER — Encounter: Payer: Self-pay | Admitting: Internal Medicine

## 2013-05-14 VITALS — BP 160/88 | HR 76 | Temp 98.1°F | Ht 64.0 in | Wt 222.0 lb

## 2013-05-14 DIAGNOSIS — I10 Essential (primary) hypertension: Secondary | ICD-10-CM

## 2013-05-14 MED ORDER — LABETALOL HCL 200 MG PO TABS: 200.0000 mg | ORAL_TABLET | Freq: Two times a day (BID) | ORAL | Status: DC

## 2013-05-14 MED ORDER — DILTIAZEM HCL ER COATED BEADS 180 MG PO CP24: 180.0000 mg | ORAL_CAPSULE | Freq: Every day | ORAL | Status: DC

## 2013-05-14 NOTE — Progress Notes (Signed)
Pre visit review using our clinic review tool, if applicable. No additional management support is needed unless otherwise documented below in the visit note. 

## 2013-05-14 NOTE — Assessment & Plan Note (Signed)
Add diltiazem- note intolerance to amlodipine and that he is on atorvastatin  F/u 2-4 weeks with Bebe Liter.

## 2013-05-14 NOTE — Progress Notes (Signed)
Home bps 150-180/90s Has increased labetalol to tid- he admits to missing the med frequently in the afternoon.  Reviewed meds and pmh  Ros- no complaints  Exam:  Obese bp 160/88 cv- reg rate  No problem-specific assessment & plan notes found for this encounter.

## 2013-05-15 ENCOUNTER — Telehealth: Payer: Self-pay | Admitting: Internal Medicine

## 2013-05-15 ENCOUNTER — Telehealth: Payer: Self-pay

## 2013-05-15 NOTE — Telephone Encounter (Signed)
Relevant patient education assigned to patient using Emmi. ° °

## 2013-05-21 ENCOUNTER — Ambulatory Visit: Payer: Medicare Other | Admitting: Internal Medicine

## 2013-06-12 ENCOUNTER — Encounter: Payer: Self-pay | Admitting: Physician Assistant

## 2013-06-12 ENCOUNTER — Ambulatory Visit (INDEPENDENT_AMBULATORY_CARE_PROVIDER_SITE_OTHER): Payer: Medicare Other | Admitting: Physician Assistant

## 2013-06-12 VITALS — BP 150/80 | HR 63 | Temp 98.0°F | Resp 18 | Wt 227.0 lb

## 2013-06-12 DIAGNOSIS — I10 Essential (primary) hypertension: Secondary | ICD-10-CM

## 2013-06-12 DIAGNOSIS — Z09 Encounter for follow-up examination after completed treatment for conditions other than malignant neoplasm: Secondary | ICD-10-CM

## 2013-06-12 MED ORDER — DILTIAZEM HCL ER COATED BEADS 360 MG PO CP24: 360.0000 mg | ORAL_CAPSULE | Freq: Every day | ORAL | Status: DC

## 2013-06-12 NOTE — Progress Notes (Signed)
Subjective:    Patient ID: Paul [name], male    DOB: 16-Oct-1945, 68 y.o.   MRN: [id number]  HPI Pt is 68 y/o caucasian male with Hx of HTN, presenting for HTN med follow up appointment. Pt was switched from amlodipine to diltiazem last month due to LE edema likely from the amlodipine. 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 Pt states that his new medication combination is not working as his home BP was 162/96 this AM. He states his BP had been under better control with the previous med combo with readings usually less than 140/80. Tolerating meds well, no adverse effects, no other complaints. Denies Fevers, chills, nausea, vomiting, diarrhea, and SOB.   Review of Systems As per HPI and are otherwise negative.  Past Medical History  Diagnosis Date  . Diabetes mellitus     type 2  . Depression   . Hypertension   . Hyperlipidemia   . Microalbuminuria    Past Surgical History  Procedure Laterality Date  . Tonsillectomy and adenoidectomy    . Mass excision  09/29/2011    Procedure: MINOR EXCISION OF MASS;  Surgeon: Pedro Earls, MD;  Location: [place];  Service: General;  Laterality: Left;  Excision sebaceous cyst on neck    reports that he quit smoking about 14 years ago. His smoking use included Cigars and Cigarettes. He smoked 0.00 packs per day. He does not have any smokeless tobacco history on file. He reports that he does not drink alcohol or use illicit drugs. family history includes Cancer in his mother; Cerebral aneurysm in his father; Heart attack in his mother; Hypertension in his father. Allergies  Allergen Reactions  . Amlodipine     ? swelling       Objective:   Physical Exam  Nursing note and vitals reviewed. Constitutional: He is oriented to person, place, and time. He appears well-developed and well-nourished. No distress.  HENT:  Head: Normocephalic and atraumatic.  Eyes: Conjunctivae and EOM are normal.  Pupils are equal, round, and reactive to light.  Neck: Normal range of motion. Neck supple.  Cardiovascular: Normal rate, regular rhythm, normal heart sounds and intact distal pulses.  Exam reveals no gallop and no friction rub.   No murmur heard. Pulmonary/Chest: Effort normal and breath sounds normal. No respiratory distress. He has no wheezes. He has no rales. He exhibits no tenderness.  Musculoskeletal: Normal range of motion. He exhibits no edema.  Neurological: He is alert and oriented to person, place, and time.  Skin: Skin is warm and dry. No rash noted. He is not diaphoretic. No erythema. No pallor.  Psychiatric: He has a normal mood and affect. His behavior is normal. Judgment and thought content normal.   Filed Vitals:   06/12/13 0754  BP: 150/80  Pulse: 63  Temp: 98 F (36.7 C)  Resp: 18    Lab Results  Component Value Date   WBC 8.5 04/19/2011   HGB 14.1 04/19/2011   HCT 42.1 04/19/2011   PLT 179.0 04/19/2011   GLUCOSE 175* 04/30/2013   CHOL 157 04/30/2013   TRIG 157.0* 04/30/2013   HDL 38.80* 04/30/2013   LDLCALC 87 04/30/2013   ALT 47 04/30/2013   AST 31 04/30/2013   NA 139 04/30/2013   K 3.5 04/30/2013   CL 101 04/30/2013   CREATININE 1.4 04/30/2013   BUN 20 04/30/2013   CO2 28 04/30/2013   HGBA1C 9.5* 04/30/2013   MICROALBUR 41.9* 04/30/2013  Assessment & Plan:  Paul [name] was seen today for follow up bp.  Diagnoses and associated orders for this visit:  Follow up Comments: HTN, still not completely controlled with medications. BPs consistently over 140/80. - diltiazem (CARDIZEM CD) 360 MG 24 hr capsule; Take 1 capsule (360 mg total) by mouth daily. - US Renal Artery Stenosis; Future  Unspecified essential hypertension - diltiazem (CARDIZEM CD) 360 MG 24 hr capsule; Take 1 capsule (360 mg total) by mouth daily. - US Renal Artery Stenosis; Future    Discussed the chance of increased edema on diltiazem with pt, pt will monitor. Discussed the chance of interaction  between diltiazem and statins with pt causing adverse effects, pt will monitor.   Plan to follow up in 2 weeks to reassess.  Patient Instructions  Diltiazem has been increased to 360 mg per day.  This may increase your chance of having edema in your lower legs.  You will be called to schedule an ultrasound to look at your kidneys.  Follow up in 2 weeks to reassess.

## 2013-06-12 NOTE — Patient Instructions (Signed)
Diltiazem has been increased to 360 mg per day.  This may increase your chance of having edema in your lower legs.  You will be called to schedule an ultrasound to look at your kidneys.  Follow up in 2 weeks to reassess.  Hypertension Hypertension is another name for high blood pressure. High blood pressure may mean that your heart needs to work harder to pump blood. Blood pressure consists of two numbers, which includes a higher number over a lower number (example: 110/72). HOME CARE   Make lifestyle changes as told by your doctor. This may include weight loss and exercise.  Take your blood pressure medicine every day.  Limit how much salt you use.  Stop smoking if you smoke.  Do not use drugs.  Talk to your doctor if you are using decongestants or birth control pills. These medicines might make blood pressure higher.  Females should not drink more than 1 alcoholic drink per day. Males should not drink more than 2 alcoholic drinks per day.  See your doctor as told. GET HELP RIGHT AWAY IF:   You have a blood pressure reading with a top number of 180 or higher.  You get a very bad headache.  You get blurred or changing vision.  You feel confused.  You feel weak, numb, or faint.  You get chest or belly (abdominal) pain.  You throw up (vomit).  You cannot breathe very well. MAKE SURE YOU:   Understand these instructions.  Will watch your condition.  Will get help right away if you are not doing well or get worse. Document Released: 06/23/2007 Document Revised: 03/29/2011 Document Reviewed: 06/23/2007 [facility] Patient Information 2014 [place], [place].

## 2013-06-12 NOTE — Progress Notes (Signed)
Pre visit review using our clinic review tool, if applicable. No additional management support is needed unless otherwise documented below in the visit note. 

## 2013-06-20 ENCOUNTER — Other Ambulatory Visit: Payer: Self-pay

## 2013-06-20 DIAGNOSIS — I10 Essential (primary) hypertension: Secondary | ICD-10-CM

## 2013-06-26 ENCOUNTER — Other Ambulatory Visit: Payer: Self-pay | Admitting: Physician Assistant

## 2013-06-26 ENCOUNTER — Ambulatory Visit
Admission: RE | Admit: 2013-06-26 | Discharge: 2013-06-26 | Disposition: A | Discharge status: Home / Self Care | Payer: Medicare Other | Source: Ambulatory Visit | Attending: Physician Assistant | Admitting: Physician Assistant

## 2013-06-26 ENCOUNTER — Ambulatory Visit: Payer: Medicare Other | Admitting: Physician Assistant

## 2013-06-26 DIAGNOSIS — I10 Essential (primary) hypertension: Secondary | ICD-10-CM

## 2013-06-26 DIAGNOSIS — Z09 Encounter for follow-up examination after completed treatment for conditions other than malignant neoplasm: Secondary | ICD-10-CM

## 2013-06-27 ENCOUNTER — Encounter: Payer: Self-pay | Admitting: Physician Assistant

## 2013-06-27 ENCOUNTER — Ambulatory Visit (INDEPENDENT_AMBULATORY_CARE_PROVIDER_SITE_OTHER): Payer: Medicare Other | Admitting: Physician Assistant

## 2013-06-27 VITALS — BP 122/72 | HR 86 | Temp 98.0°F | Resp 18 | Wt 228.0 lb

## 2013-06-27 DIAGNOSIS — Z09 Encounter for follow-up examination after completed treatment for conditions other than malignant neoplasm: Secondary | ICD-10-CM

## 2013-06-27 DIAGNOSIS — I10 Essential (primary) hypertension: Secondary | ICD-10-CM

## 2013-06-27 MED ORDER — HYDRALAZINE HCL 50 MG PO TABS: 50.0000 mg | ORAL_TABLET | Freq: Three times a day (TID) | ORAL | Status: DC

## 2013-06-27 MED ORDER — PAROXETINE HCL 20 MG PO TABS: 20.0000 mg | ORAL_TABLET | Freq: Every day | ORAL | Status: DC

## 2013-06-27 MED ORDER — LOSARTAN POTASSIUM 100 MG PO TABS: 100.0000 mg | ORAL_TABLET | Freq: Every day | ORAL | Status: DC

## 2013-06-27 MED ORDER — BUMETANIDE 2 MG PO TABS: 2.0000 mg | ORAL_TABLET | Freq: Every day | ORAL | Status: DC

## 2013-06-27 MED ORDER — POTASSIUM CHLORIDE CRYS ER 20 MEQ PO TBCR: 20.0000 meq | EXTENDED_RELEASE_TABLET | Freq: Every day | ORAL | Status: DC

## 2013-06-27 MED ORDER — ATORVASTATIN CALCIUM 20 MG PO TABS: ORAL_TABLET | ORAL | Status: DC

## 2013-06-27 MED ORDER — LABETALOL HCL 200 MG PO TABS: 200.0000 mg | ORAL_TABLET | Freq: Two times a day (BID) | ORAL | Status: DC

## 2013-06-27 MED ORDER — DILTIAZEM HCL ER COATED BEADS 360 MG PO CP24: 360.0000 mg | ORAL_CAPSULE | Freq: Every day | ORAL | Status: DC

## 2013-06-27 NOTE — Patient Instructions (Addendum)
All of your medications should have refills now.  Stop your ramipril. It should not be offering you much additional benefit since you are also taking losartan.  Monitor your swelling of your legs and take an extra bumetanide in the afternoon for increased leg swelling as needed.  Plan to follow up in 1 month to reassess, or sooner if symptoms persist or worsen despite treatment.  We will followup this Friday to check your home BP cuff accuracy since you are going out of town next week.      Hypertension Hypertension is another name for high blood pressure. High blood pressure may mean that your heart needs to work harder to pump blood. Blood pressure consists of two numbers, which includes a higher number over a lower number (example: 110/72). HOME CARE   Make lifestyle changes as told by your doctor. This may include weight loss and exercise.  Take your blood pressure medicine every day.  Limit how much salt you use.  Stop smoking if you smoke.  Do not use drugs.  Talk to your doctor if you are using decongestants or birth control pills. These medicines might make blood pressure higher.  Females should not drink more than 1 alcoholic drink per day. Males should not drink more than 2 alcoholic drinks per day.  See your doctor as told. GET HELP RIGHT AWAY IF:   You have a blood pressure reading with a top number of 180 or higher.  You get a very bad headache.  You get blurred or changing vision.  You feel confused.  You feel weak, numb, or faint.  You get chest or belly (abdominal) pain.  You throw up (vomit).  You cannot breathe very well. MAKE SURE YOU:   Understand these instructions.  Will watch your condition.  Will get help right away if you are not doing well or get worse. Document Released: 06/23/2007 Document Revised: 03/29/2011 Document Reviewed: 06/23/2007 [facility] Patient Information 2014 [place], [place].

## 2013-06-27 NOTE — Progress Notes (Signed)
Pre visit review using our clinic review tool, if applicable. No additional management support is needed unless otherwise documented below in the visit note. 

## 2013-06-27 NOTE — Addendum Note (Signed)
Addended by: Colleen Can on: 06/27/2013 09:56 AM   Modules accepted: Orders

## 2013-06-27 NOTE — Progress Notes (Signed)
Subjective:    Patient ID: Paul [name], Paul [name]    DOB: 01/02/1946, 68 y.o.   MRN: [id number]  HPI Patient is a 68 year old Caucasian Paul [name] presenting to the clinic for followup of uncontrolled hypertension. He is on multiple blood pressure medications. At last visit his diltiazem dose was increased to try to better control his blood pressure and a renal ultrasound was ordered. Patient recently had a renal ultrasound performed which showed no renal artery stenosis. Patient states that he has still been getting high blood pressure readings at home. Pt states that over the past month or so, since stopping his amlodipine, he has started to notice that he feels headaches start to develop, but never becomes a "full" headache. States that he has noticed a little more leg swelling now on the Diltiazem for the past few days. Pt denies F/C/N/V/D/SOB/CP/Syncope.    Review of Systems As per HPI and otherwise negative.    Past Medical History  Diagnosis Date  . Diabetes mellitus     type 2  . Depression   . Hypertension   . Hyperlipidemia   . Microalbuminuria    Past Surgical History  Procedure Laterality Date  . Tonsillectomy and adenoidectomy    . Mass excision  09/29/2011    Procedure: MINOR EXCISION OF MASS;  Surgeon: Pedro Earls, MD;  Location: [place];  Service: General;  Laterality: Left;  Excision sebaceous cyst on neck    reports that he quit smoking about 14 years ago. His smoking use included Cigars and Cigarettes. He smoked 0.00 packs per day. He does not have any smokeless tobacco history on file. He reports that he does not drink alcohol or use illicit drugs. family history includes Cancer in his mother; Cerebral aneurysm in his father; Heart attack in his mother; Hypertension in his father. Allergies  Allergen Reactions  . Amlodipine     ? swelling       Objective:   Physical Exam  Nursing note and vitals reviewed. Constitutional: He is oriented to  person, place, and time. He appears well-developed and well-nourished. No distress.  HENT:  Head: Normocephalic and atraumatic.  Eyes: Conjunctivae and EOM are normal. Pupils are equal, round, and reactive to light.  Neck: Normal range of motion. Neck supple.  Cardiovascular: Normal rate, regular rhythm, normal heart sounds and intact distal pulses.  Exam reveals no gallop and no friction rub.   No murmur heard. Pulmonary/Chest: Effort normal and breath sounds normal. No respiratory distress. He has no wheezes. He has no rales. He exhibits no tenderness.  Musculoskeletal: Normal range of motion. He exhibits edema (1+ pitting edema bilat LE.). He exhibits no tenderness.  Neurological: He is alert and oriented to person, place, and time.  Skin: Skin is warm and dry. No rash noted. He is not diaphoretic. No erythema. No pallor.  Psychiatric: He has a normal mood and affect. His behavior is normal. Judgment and thought content normal.    Filed Vitals:   06/27/13 0846  BP: 122/72  Pulse: 86  Temp: 98 F (36.7 C)  Resp: 18      Lab Results  Component Value Date   WBC 8.5 04/19/2011   HGB 14.1 04/19/2011   HCT 42.1 04/19/2011   PLT 179.0 04/19/2011   GLUCOSE 175* 04/30/2013   CHOL 157 04/30/2013   TRIG 157.0* 04/30/2013   HDL 38.80* 04/30/2013   LDLCALC 87 04/30/2013   ALT 47 04/30/2013   AST 31 04/30/2013  NA 139 04/30/2013   K 3.5 04/30/2013   CL 101 04/30/2013   CREATININE 1.4 04/30/2013   BUN 20 04/30/2013   CO2 28 04/30/2013   HGBA1C 9.5* 04/30/2013   MICROALBUR 41.9* 04/30/2013         Assessment & Plan:  Korin was seen today for follow-up.  Diagnoses and associated orders for this visit:  Follow up Comments: Tolerating Diltiazem, though now has developed LE edema. Renal US normal.  Unspecified essential hypertension Comments: Currently normal BP. Has been getting higher readings at home.  HYPERTENSION   There is most likely a volume issue as pt still experiencing  occasional swelling. Will have pt take additional dose of Bumex in early afternoon as needed for LE edema.  Pt also of both ramipril and losartan, unsure as to why. Will have pt discontinue ramipril and monitor BP frequently for change.  Pt going out of town next week, will have him follow up in 2 days to test his home bp cuff prior to leaving town as his home bp was 167/95 prior to visit, and was 122/72 in office today.  Plan to follow up again in 1 month to reassess, or sooner for worsening or persistent symptoms.   Patient Instructions  All of your medications should have refills now.  Stop your ramipril. It should not be offering you much additional benefit since you are also taking losartan.  Monitor your swelling of your legs and take an extra bumetanide in the afternoon for increased leg swelling as needed.  Plan to follow up in 1 month to reassess, or sooner if symptoms persist or worsen despite treatment.  We will followup this Friday to check your home BP cuff accuracy since you are going out of town next week.

## 2013-06-29 ENCOUNTER — Encounter: Payer: Self-pay | Admitting: Physician Assistant

## 2013-06-29 ENCOUNTER — Ambulatory Visit (INDEPENDENT_AMBULATORY_CARE_PROVIDER_SITE_OTHER): Payer: Medicare Other | Admitting: Physician Assistant

## 2013-06-29 VITALS — BP 142/70 | HR 92 | Temp 98.0°F | Resp 18 | Wt 228.0 lb

## 2013-06-29 DIAGNOSIS — Z09 Encounter for follow-up examination after completed treatment for conditions other than malignant neoplasm: Secondary | ICD-10-CM

## 2013-06-29 DIAGNOSIS — I10 Essential (primary) hypertension: Secondary | ICD-10-CM

## 2013-06-29 NOTE — Progress Notes (Signed)
Pre visit review using our clinic review tool, if applicable. No additional management support is needed unless otherwise documented below in the visit note. 

## 2013-06-29 NOTE — Progress Notes (Signed)
Subjective:    Patient ID: Paul [name], male    DOB: 06-04-1945, 68 y.o.   MRN: [id number]  HPI Patient is a 68 year old Caucasian male presents to the clinic for followup of hypertension. Patient presenting today to check the accuracy of his home blood pressure cuff. Patient has been getting higher readings at home than in the office. He is about to go away on vacation and wants to make sure that his blood pressure cuff is accurate before he leaves. Pt is currently asymptomatic. States that his leg swelling has decreased with the addition of an afternoon Bumex dose as discussed at the last visit. He is tolerating his medications well. He denies fevers, chills, nausea, vomiting, diarrhea, shortness of breath, chest pain.   Review of Systems As per history of present illness and are otherwise negative.    Past Medical History  Diagnosis Date  . Diabetes mellitus     type 2  . Depression   . Hypertension   . Hyperlipidemia   . Microalbuminuria    Past Surgical History  Procedure Laterality Date  . Tonsillectomy and adenoidectomy    . Mass excision  09/29/2011    Procedure: MINOR EXCISION OF MASS;  Surgeon: Pedro Earls, MD;  Location: [place];  Service: General;  Laterality: Left;  Excision sebaceous cyst on neck    reports that he quit smoking about 14 years ago. His smoking use included Cigars and Cigarettes. He smoked 0.00 packs per day. He does not have any smokeless tobacco history on file. He reports that he does not drink alcohol or use illicit drugs. family history includes Cancer in his mother; Cerebral aneurysm in his father; Heart attack in his mother; Hypertension in his father. Allergies  Allergen Reactions  . Amlodipine     ? swelling       Objective:   Physical Exam  Nursing note and vitals reviewed. Constitutional: He is oriented to person, place, and time. He appears well-developed and well-nourished. No distress.  HENT:  Head:  Normocephalic and atraumatic.  Eyes: Conjunctivae and EOM are normal. Pupils are equal, round, and reactive to light.  Neck: Normal range of motion. Neck supple.  Cardiovascular: Normal rate, regular rhythm, normal heart sounds and intact distal pulses.  Exam reveals no gallop and no friction rub.   No murmur heard. Pulmonary/Chest: Effort normal and breath sounds normal. No respiratory distress. He has no wheezes. He has no rales. He exhibits no tenderness.  Musculoskeletal: Normal range of motion. He exhibits edema (less than 1+ pitting edmea bilat LE.). He exhibits no tenderness.  Neurological: He is alert and oriented to person, place, and time.  Skin: Skin is warm and dry. No rash noted. He is not diaphoretic. No erythema. No pallor.  Psychiatric: He has a normal mood and affect. His behavior is normal. Judgment and thought content normal.    Filed Vitals:   06/29/13 0904  BP: 142/70  Pulse: 92  Temp: 98 F (36.7 C)  Resp: 18    Lab Results  Component Value Date   WBC 8.5 04/19/2011   HGB 14.1 04/19/2011   HCT 42.1 04/19/2011   PLT 179.0 04/19/2011   GLUCOSE 175* 04/30/2013   CHOL 157 04/30/2013   TRIG 157.0* 04/30/2013   HDL 38.80* 04/30/2013   LDLCALC 87 04/30/2013   ALT 47 04/30/2013   AST 31 04/30/2013   NA 139 04/30/2013   K 3.5 04/30/2013   CL 101 04/30/2013  CREATININE 1.4 04/30/2013   BUN 20 04/30/2013   CO2 28 04/30/2013   HGBA1C 9.5* 04/30/2013   MICROALBUR 41.9* 04/30/2013        Assessment & Plan:  Seanpatrick was seen today for 2 day follow up.  Diagnoses and associated orders for this visit:  Follow up Comments: Swelling has decreased. Pt will wait to discontinue Ramipril until he returns from vacation as it may be working to reduce BP.  Unspecified essential hypertension Comments: Home BP cuff inaccurate and old. Encouraged pt to get new cuff for more accurate readings. Will recheck in 4 weeks.    Plan was to originally discontinue Ramipril as he is also on  losartan, however, due to his approaching vacation, we will not change his therapy until he returns, so as to avoid higher bps while he is out of the country. He states there will be a nearby hospital if he needs help. Emergency symptoms and return precautions provided, and pt handout on HTN.   Plan is to follow up in 4 weeks to reassess, or sooner if symptoms worsen or fail to improve.   Patient Instructions  Continue all of your current blood pressure medications while you were away to prevent from having too high blood pressure.  When she returned from your trip, discontinue the Ramipril as discussed.  I strongly encourage you to purchase a new blood pressure cuff for home use, seeing as your current cuff is giving [place] an accurate numbers.  If while on vacation, emergency symptoms that we discussed during the visit developed, seek medical attention immediately.   Followup in 4 weeks to reassess, or sooner if symptoms worsen or fail to improve despite treatment.

## 2013-06-29 NOTE — Patient Instructions (Signed)
Continue all of your current blood pressure medications while you were away to prevent from having too high blood pressure.  When she returned from your trip, discontinue the Ramipril as discussed.  I strongly encourage you to purchase a new blood pressure cuff for home use, seeing as your current cuff is giving [place] an accurate numbers.  If while on vacation, emergency symptoms that we discussed during the visit developed, seek medical attention immediately.   Followup in 4 weeks to reassess, or sooner if symptoms worsen or fail to improve despite treatment.   Hypertension Hypertension is another name for high blood pressure. High blood pressure may mean that your heart needs to work harder to pump blood. Blood pressure consists of two numbers, which includes a higher number over a lower number (example: 110/72). HOME CARE   Make lifestyle changes as told by your doctor. This may include weight loss and exercise.  Take your blood pressure medicine every day.  Limit how much salt you use.  Stop smoking if you smoke.  Do not use drugs.  Talk to your doctor if you are using decongestants or birth control pills. These medicines might make blood pressure higher.  Females should not drink more than 1 alcoholic drink per day. Males should not drink more than 2 alcoholic drinks per day.  See your doctor as told. GET HELP RIGHT AWAY IF:   You have a blood pressure reading with a top number of 180 or higher.  You get a very bad headache.  You get blurred or changing vision.  You feel confused.  You feel weak, numb, or faint.  You get chest or belly (abdominal) pain.  You throw up (vomit).  You cannot breathe very well. MAKE SURE YOU:   Understand these instructions.  Will watch your condition.  Will get help right away if you are not doing well or get worse. Document Released: 06/23/2007 Document Revised: 03/29/2011 Document Reviewed: 06/23/2007 [facility] Patient  Information 2014 [place], [place].

## 2013-07-25 ENCOUNTER — Ambulatory Visit (INDEPENDENT_AMBULATORY_CARE_PROVIDER_SITE_OTHER): Payer: Medicare Other | Admitting: Physician Assistant

## 2013-07-25 ENCOUNTER — Encounter: Payer: Self-pay | Admitting: Physician Assistant

## 2013-07-25 VITALS — BP 148/82 | HR 60 | Temp 98.5°F | Resp 18 | Wt 233.0 lb

## 2013-07-25 DIAGNOSIS — I10 Essential (primary) hypertension: Secondary | ICD-10-CM

## 2013-07-25 DIAGNOSIS — Z09 Encounter for follow-up examination after completed treatment for conditions other than malignant neoplasm: Secondary | ICD-10-CM

## 2013-07-25 MED ORDER — LABETALOL HCL 200 MG PO TABS: 200.0000 mg | ORAL_TABLET | Freq: Two times a day (BID) | ORAL | Status: DC

## 2013-07-25 MED ORDER — LABETALOL HCL 200 MG PO TABS
200.0000 mg | ORAL_TABLET | Freq: Two times a day (BID) | ORAL | Status: DC
Start: 2013-07-25 — End: 2013-07-26

## 2013-07-25 NOTE — Progress Notes (Signed)
Subjective:    Patient ID: Paul [name], male    DOB: July 29, 1945, 68 y.o.   MRN: [id number]  HPI Patient is a 68 y.o. male presenting for hypertension. Pt was last seen on 06/29/2013 where we had discussed trying to stop his ramipril once he returned from a trip to [place], as he was already taking losartan, and the combo was probably not helping. He states that since he returned about 7 days ago, he has not taken his ramipril for about a week. He has not noticed any adverse effects after stopping this medication. He states that he is still experiencing occasional leg swelling, however this is treated easily with leg elevation. He states that he has not purchased a ne BP cuff yet, as he was waiting to return from his trip in order to due this. He denies F/C/N/V/D/SOB/CP/HA/Syncope.    Review of Systems As per HPI and are otherwise negative.   Past Medical History  Diagnosis Date  . Diabetes mellitus     type 2  . Depression   . Hypertension   . Hyperlipidemia   . Microalbuminuria     History   Social History  . Marital Status: Married    Spouse Name: N/A    Number of Children: N/A  . Years of Education: N/A   Occupational History  . Not on file.   Social History Main Topics  . Smoking status: Former Smoker    Types: Cigars, Cigarettes    Quit date: 09/13/1998  . Smokeless tobacco: Not on file  . Alcohol Use: No  . Drug Use: No  . Sexual Activity:    Other Topics Concern  . Not on file   Social History Narrative  . No narrative on file    Past Surgical History  Procedure Laterality Date  . Tonsillectomy and adenoidectomy    . Mass excision  09/29/2011    Procedure: MINOR EXCISION OF MASS;  Surgeon: Paul Earls, MD;  Location: [place];  Service: General;  Laterality: Left;  Excision sebaceous cyst on neck    Family History  Problem Relation Age of Onset  . Cancer Mother     breast  . Heart attack Mother   . Hypertension Father   .  Cerebral aneurysm Father     Allergies  Allergen Reactions  . Amlodipine     ? swelling    Current Outpatient Prescriptions on File Prior to Visit  Medication Sig Dispense Refill  . atorvastatin (LIPITOR) 20 MG tablet TAKE 1 TABLET DAILY  90 tablet  1  . Blood Glucose Monitoring Suppl (ACCU-CHEK NANO SMARTVIEW) W/DEVICE KIT 1 each by Does not apply route daily.  1 kit  0  . bumetanide (BUMEX) 2 MG tablet Take 1 tablet (2 mg total) by mouth daily.  90 tablet  3  . diltiazem (CARDIZEM CD) 360 MG 24 hr capsule Take 1 capsule (360 mg total) by mouth daily.  90 capsule  1  . glucose blood (FREESTYLE TEST STRIPS) test strip 1 each by Other route 2 (two) times daily. And lancets 2/day 250.01  180 each  3  . hydrALAZINE (APRESOLINE) 50 MG tablet Take 1 tablet (50 mg total) by mouth 3 (three) times daily.  300 tablet  1  . Insulin Aspart Prot & Aspart (70-30) 100 UNIT/ML SUPN Inject 80 Units into the skin 2 (two) times daily with a meal. and pen needles 2/day      . Insulin Pen Needle (  B-D ULTRAFINE III SHORT PEN) 31G X 8 MM MISC USE 2 TIMES DAILY.  300 each  3  . losartan (COZAAR) 100 MG tablet Take 1 tablet (100 mg total) by mouth daily.  90 tablet  1  . NOVOLOG MIX 70/30 FLEXPEN (70-30) 100 UNIT/ML Pen Inject subcutaneously 50  units two times daily with  a meal  90 mL  3  . PARoxetine (PAXIL) 20 MG tablet Take 1 tablet (20 mg total) by mouth daily.  90 tablet  1  . potassium chloride SA (KLOR-CON M20) 20 MEQ tablet Take 1 tablet (20 mEq total) by mouth daily.  90 tablet  1   No current facility-administered medications on file prior to visit.    EXAM: BP 148/82  Pulse 60  Temp(Src) 98.5 F (36.9 C) (Oral)  Resp 18  Wt 233 lb (105.688 kg)     Objective:   Physical Exam  Nursing note and vitals reviewed. Constitutional: He is oriented to person, place, and time. He appears well-developed and well-nourished. No distress.  HENT:  Head: Normocephalic and atraumatic.  Eyes:  Conjunctivae and EOM are normal. Pupils are equal, round, and reactive to light.  Neck: Normal range of motion.  Cardiovascular: Normal rate, regular rhythm and intact distal pulses.   Pulmonary/Chest: Effort normal and breath sounds normal. No respiratory distress. He exhibits no tenderness.  Musculoskeletal: Normal range of motion. He exhibits no edema.  Neurological: He is alert and oriented to person, place, and time.  Skin: Skin is warm and dry. No rash noted. He is not diaphoretic. No erythema. No pallor.  Psychiatric: He has a normal mood and affect. His behavior is normal. Judgment and thought content normal.     Lab Results  Component Value Date   WBC 8.5 04/19/2011   HGB 14.1 04/19/2011   HCT 42.1 04/19/2011   PLT 179.0 04/19/2011   GLUCOSE 175* 04/30/2013   CHOL 157 04/30/2013   TRIG 157.0* 04/30/2013   HDL 38.80* 04/30/2013   LDLCALC 87 04/30/2013   ALT 47 04/30/2013   AST 31 04/30/2013   NA 139 04/30/2013   K 3.5 04/30/2013   CL 101 04/30/2013   CREATININE 1.4 04/30/2013   BUN 20 04/30/2013   CO2 28 04/30/2013   HGBA1C 9.5* 04/30/2013   MICROALBUR 41.9* 04/30/2013        Assessment & Plan:  Paul [name] was seen today for follow-up.  Diagnoses and associated orders for this visit:  Follow up Comments: now off of ramapril. no adverse effects. continue current management, will have pt return to office in 1 month to reassess and check new home BP cuff.  Unspecified essential hypertension Comments: Pt states his lobetatolol never got to [facility]. Needs 30 day supply and 90 day resent to [facility].  Other Orders - labetalol (NORMODYNE) 200 MG tablet; Take 1 tablet (200 mg total) by mouth 2 (two) times daily.    Pt will continue monitoring home BPs. BP today slightly elevated at 148/82 off of ramapril. Will follow up in about 1 month to reassess.  Stress the importance of diet and exercise in maintaining BP control.  Return precautions provided, and patient handout on HTN and DASH  diet.  Plan to follow up in about 1 month, or for worsening or persistent symptoms despite treatment.  Patient Instructions  We have sent a 30 day supply of your labetalol to [facility], and have also sent 90 day supply to [facility].  Go ahead and purchase a  new BP cuff for home use. At followup in one month bring your new blood pressure cuff to check the accuracy of the blood pressure cuff.  Continue checking home blood pressures.  Continue to restrict salt, and increase activity, and maintain a healthy diet in order to better maintain a healthy blood pressure.  If emergency symptoms discussed during visit developed, seek medical attention immediately.  Followup in 1 months to reassess, or for worsening or persistent symptoms despite treatment.

## 2013-07-25 NOTE — Patient Instructions (Addendum)
We have sent a 30 day supply of your labetalol to [facility], and have also sent 90 day supply to [facility].  Go ahead and purchase a new BP cuff for home use. At followup in one month bring your new blood pressure cuff to check the accuracy of the blood pressure cuff.  Continue checking home blood pressures.  Continue to restrict salt, and increase activity, and maintain a healthy diet in order to better maintain a healthy blood pressure.  If emergency symptoms discussed during visit developed, seek medical attention immediately.  Followup in 1 months to reassess, or for worsening or persistent symptoms despite treatment.  Hypertension Hypertension is another name for high blood pressure. High blood pressure forces your heart to work harder to pump blood. A blood pressure reading has two numbers, which includes a higher number over a lower number (example: 110/72). HOME CARE   Have your blood pressure rechecked by your doctor.  Only take medicine as told by your doctor. Follow the directions carefully. The medicine does not work as well if you skip doses. Skipping doses also puts you at risk for problems.  Do not smoke.  Monitor your blood pressure at home as told by your doctor. GET HELP IF:  You think you are having a reaction to the medicine you are taking.  You have repeat headaches or feel dizzy.  You have puffiness (swelling) in your ankles.  You have trouble with your vision. GET HELP RIGHT AWAY IF:   You get a very bad headache and are confused.  You feel weak, numb, or faint.  You get chest or belly (abdominal) pain.  You throw up (vomit).  You cannot breathe very well. MAKE SURE YOU:   Understand these instructions.  Will watch your condition.  Will get help right away if you are not doing well or get worse. Document Released: 06/23/2007 Document Revised: 01/09/2013 Document Reviewed: 10/27/2012 [facility] Patient Information 2015 [place], [place]. This information  is not intended to replace advice given to you by your health care provider. Make sure you discuss any questions you have with your health care provider. DASH Eating Plan DASH stands for "Dietary Approaches to Stop Hypertension." The DASH eating plan is a healthy eating plan that has been shown to reduce high blood pressure (hypertension). Additional health benefits may include reducing the risk of type 2 diabetes mellitus, heart disease, and stroke. The DASH eating plan may also help with weight loss. WHAT DO I NEED TO KNOW ABOUT THE DASH EATING PLAN? For the DASH eating plan, you will follow these general guidelines:  Choose foods with a percent daily value for sodium of less than 5% (as listed on the food label).  Use salt-free seasonings or herbs instead of table salt or sea salt.  Check with your health care provider or pharmacist before using salt substitutes.  Eat lower-sodium products, often labeled as "lower sodium" or "no salt added."  Eat fresh foods.  Eat more vegetables, fruits, and low-fat dairy products.  Choose whole grains. Look for the word "whole" as the first word in the ingredient list.  Choose fish and skinless chicken or [place] more often than red meat. Limit fish, poultry, and meat to 6 oz (170 g) each day.  Limit sweets, desserts, sugars, and sugary drinks.  Choose heart-healthy fats.  Limit cheese to 1 oz (28 g) per day.  Eat more home-cooked food and less restaurant, buffet, and fast food.  Limit fried foods.  Cook foods using  methods other than frying.  Limit canned vegetables. If you do use them, rinse them well to decrease the sodium.  When eating at a restaurant, ask that your food be prepared with less salt, or no salt if possible. WHAT FOODS CAN I EAT? Seek help from a dietitian for individual calorie needs. Grains Whole grain or whole wheat bread. Brown rice. Whole grain or whole wheat pasta. Quinoa, bulgur, and whole grain cereals. Low-sodium  cereals. Corn or whole wheat flour tortillas. Whole grain cornbread. Whole grain crackers. Low-sodium crackers. Vegetables Fresh or frozen vegetables (raw, steamed, roasted, or grilled). Low-sodium or reduced-sodium tomato and vegetable juices. Low-sodium or reduced-sodium tomato sauce and paste. Low-sodium or reduced-sodium canned vegetables.  Fruits All fresh, canned (in natural juice), or frozen fruits. Meat and Other Protein Products Ground beef (85% or leaner), grass-fed beef, or beef trimmed of fat. Skinless chicken or [place]. Ground chicken or [place]. Pork trimmed of fat. All fish and seafood. Eggs. Dried beans, peas, or lentils. Unsalted nuts and seeds. Unsalted canned beans. Dairy Low-fat dairy products, such as skim or 1% milk, 2% or reduced-fat cheeses, low-fat ricotta or cottage cheese, or plain low-fat yogurt. Low-sodium or reduced-sodium cheeses. Fats and Oils Tub margarines without trans fats. Light or reduced-fat mayonnaise and salad dressings (reduced sodium). Avocado. Safflower, olive, or canola oils. Natural peanut or almond butter. Other Unsalted popcorn and pretzels. The items listed above may not be a complete list of recommended foods or beverages. Contact your dietitian for more options. WHAT FOODS ARE NOT RECOMMENDED? Grains White bread. White pasta. White rice. Refined cornbread. Bagels and croissants. Crackers that contain trans fat. Vegetables Creamed or fried vegetables. Vegetables in a cheese sauce. Regular canned vegetables. Regular canned tomato sauce and paste. Regular tomato and vegetable juices. Fruits Dried fruits. Canned fruit in light or heavy syrup. Fruit juice. Meat and Other Protein Products Fatty cuts of meat. Ribs, chicken wings, bacon, sausage, bologna, salami, chitterlings, fatback, hot dogs, bratwurst, and packaged luncheon meats. Salted nuts and seeds. Canned beans with salt. Dairy Whole or 2% milk, cream, half-and-half, and cream cheese.  Whole-fat or sweetened yogurt. Full-fat cheeses or blue cheese. Nondairy creamers and whipped toppings. Processed cheese, cheese spreads, or cheese curds. Condiments Onion and garlic salt, seasoned salt, table salt, and sea salt. Canned and packaged gravies. Worcestershire sauce. Tartar sauce. Barbecue sauce. Teriyaki sauce. Soy sauce, including reduced sodium. Steak sauce. Fish sauce. Oyster sauce. Cocktail sauce. Horseradish. Ketchup and mustard. Meat flavorings and tenderizers. Bouillon cubes. Hot sauce. Tabasco sauce. Marinades. Taco seasonings. Relishes. Fats and Oils Butter, stick margarine, lard, shortening, ghee, and bacon fat. Coconut, palm kernel, or palm oils. Regular salad dressings. Other Pickles and olives. Salted popcorn and pretzels. The items listed above may not be a complete list of foods and beverages to avoid. Contact your dietitian for more information. WHERE CAN I FIND MORE INFORMATION? National Heart, [employer], and [employer] Institute: travelstabloid.com Document Released: 12/24/2010 Document Revised: 01/09/2013 Document Reviewed: 11/08/2012 [facility] Patient Information 2015 [place], [place]. This information is not intended to replace advice given to you by your health care provider. Make sure you discuss any questions you have with your health care provider.

## 2013-07-26 ENCOUNTER — Inpatient Hospital Stay (HOSPITAL_COMMUNITY)
Admission: EM | Admit: 2013-07-26 | Discharge: 2013-07-30 | DRG: 378 | Disposition: A | Discharge status: Home / Self Care | Payer: Medicare Other | Attending: Internal Medicine | Admitting: Internal Medicine

## 2013-07-26 ENCOUNTER — Encounter (HOSPITAL_COMMUNITY): Payer: Self-pay | Admitting: Emergency Medicine

## 2013-07-26 ENCOUNTER — Emergency Department (HOSPITAL_COMMUNITY): Payer: Medicare Other

## 2013-07-26 DIAGNOSIS — I152 Hypertension secondary to endocrine disorders: Secondary | ICD-10-CM | POA: Diagnosis present

## 2013-07-26 DIAGNOSIS — Z860100 Personal history of colon polyps, unspecified: Secondary | ICD-10-CM

## 2013-07-26 DIAGNOSIS — N189 Chronic kidney disease, unspecified: Secondary | ICD-10-CM | POA: Diagnosis present

## 2013-07-26 DIAGNOSIS — E1159 Type 2 diabetes mellitus with other circulatory complications: Secondary | ICD-10-CM | POA: Diagnosis present

## 2013-07-26 DIAGNOSIS — IMO0001 Reserved for inherently not codable concepts without codable children: Secondary | ICD-10-CM | POA: Diagnosis present

## 2013-07-26 DIAGNOSIS — K5731 Diverticulosis of large intestine without perforation or abscess with bleeding: Principal | ICD-10-CM | POA: Diagnosis present

## 2013-07-26 DIAGNOSIS — E1165 Type 2 diabetes mellitus with hyperglycemia: Secondary | ICD-10-CM

## 2013-07-26 DIAGNOSIS — K625 Hemorrhage of anus and rectum: Secondary | ICD-10-CM | POA: Diagnosis present

## 2013-07-26 DIAGNOSIS — I10 Essential (primary) hypertension: Secondary | ICD-10-CM

## 2013-07-26 DIAGNOSIS — I129 Hypertensive chronic kidney disease with stage 1 through stage 4 chronic kidney disease, or unspecified chronic kidney disease: Secondary | ICD-10-CM | POA: Diagnosis present

## 2013-07-26 DIAGNOSIS — N179 Acute kidney failure, unspecified: Secondary | ICD-10-CM | POA: Diagnosis present

## 2013-07-26 DIAGNOSIS — E669 Obesity, unspecified: Secondary | ICD-10-CM

## 2013-07-26 DIAGNOSIS — F3289 Other specified depressive episodes: Secondary | ICD-10-CM | POA: Diagnosis present

## 2013-07-26 DIAGNOSIS — Z794 Long term (current) use of insulin: Secondary | ICD-10-CM

## 2013-07-26 DIAGNOSIS — D62 Acute posthemorrhagic anemia: Secondary | ICD-10-CM | POA: Diagnosis present

## 2013-07-26 DIAGNOSIS — Z87891 Personal history of nicotine dependence: Secondary | ICD-10-CM

## 2013-07-26 DIAGNOSIS — R809 Proteinuria, unspecified: Secondary | ICD-10-CM

## 2013-07-26 DIAGNOSIS — Z85820 Personal history of malignant melanoma of skin: Secondary | ICD-10-CM

## 2013-07-26 DIAGNOSIS — F329 Major depressive disorder, single episode, unspecified: Secondary | ICD-10-CM

## 2013-07-26 DIAGNOSIS — E119 Type 2 diabetes mellitus without complications: Secondary | ICD-10-CM

## 2013-07-26 DIAGNOSIS — E785 Hyperlipidemia, unspecified: Secondary | ICD-10-CM | POA: Diagnosis present

## 2013-07-26 DIAGNOSIS — Z8249 Family history of ischemic heart disease and other diseases of the circulatory system: Secondary | ICD-10-CM

## 2013-07-26 DIAGNOSIS — Z6839 Body mass index (BMI) 39.0-39.9, adult: Secondary | ICD-10-CM

## 2013-07-26 DIAGNOSIS — E1129 Type 2 diabetes mellitus with other diabetic kidney complication: Secondary | ICD-10-CM | POA: Diagnosis present

## 2013-07-26 DIAGNOSIS — E875 Hyperkalemia: Secondary | ICD-10-CM | POA: Diagnosis present

## 2013-07-26 DIAGNOSIS — K922 Gastrointestinal hemorrhage, unspecified: Secondary | ICD-10-CM | POA: Diagnosis present

## 2013-07-26 DIAGNOSIS — D649 Anemia, unspecified: Secondary | ICD-10-CM

## 2013-07-26 DIAGNOSIS — F325 Major depressive disorder, single episode, in full remission: Secondary | ICD-10-CM | POA: Diagnosis present

## 2013-07-26 DIAGNOSIS — R9431 Abnormal electrocardiogram [ECG] [EKG]: Secondary | ICD-10-CM

## 2013-07-26 DIAGNOSIS — Z79899 Other long term (current) drug therapy: Secondary | ICD-10-CM

## 2013-07-26 HISTORY — DX: Malignant (primary) neoplasm, unspecified: C80.1

## 2013-07-26 LAB — MAGNESIUM: MAGNESIUM: 2 mg/dL (ref 1.5–2.5)

## 2013-07-26 LAB — COMPREHENSIVE METABOLIC PANEL
ALT: 39 U/L (ref 0–53)
AST: 21 U/L (ref 0–37)
Albumin: 2.9 g/dL — ABNORMAL LOW (ref 3.5–5.2)
Alkaline Phosphatase: 62 U/L (ref 39–117)
Anion gap: 9 (ref 5–15)
BUN: 18 mg/dL (ref 6–23)
CHLORIDE: 104 meq/L (ref 96–112)
CO2: 26 mEq/L (ref 19–32)
Calcium: 9.2 mg/dL (ref 8.4–10.5)
Creatinine, Ser: 1.52 mg/dL — ABNORMAL HIGH (ref 0.50–1.35)
GFR calc Af Amer: 52 mL/min — ABNORMAL LOW (ref >90)
GFR, EST NON AFRICAN AMERICAN: 45 mL/min — AB (ref >90)
Glucose, Bld: 259 mg/dL — ABNORMAL HIGH (ref 70–99)
POTASSIUM: 5.2 meq/L (ref 3.7–5.3)
Sodium: 139 mEq/L (ref 137–147)
Total Bilirubin: 0.3 mg/dL (ref 0.3–1.2)
Total Protein: 5.5 g/dL — ABNORMAL LOW (ref 6.0–8.3)

## 2013-07-26 LAB — CBC WITH DIFFERENTIAL/PLATELET
BASOS ABS: 0 10*3/uL (ref 0.0–0.1)
BASOS PCT: 0 % (ref 0–1)
BASOS PCT: 1 % (ref 0–1)
Basophils Absolute: 0 10*3/uL (ref 0.0–0.1)
EOS ABS: 0.1 10*3/uL (ref 0.0–0.7)
EOS PCT: 1 % (ref 0–5)
Eosinophils Absolute: 0.1 10*3/uL (ref 0.0–0.7)
Eosinophils Relative: 2 % (ref 0–5)
HCT: 31 % — ABNORMAL LOW (ref 39.0–52.0)
HEMATOCRIT: 26.9 % — AB (ref 39.0–52.0)
HEMOGLOBIN: 9.3 g/dL — AB (ref 13.0–17.0)
Hemoglobin: 10.7 g/dL — ABNORMAL LOW (ref 13.0–17.0)
LYMPHS PCT: 14 % (ref 12–46)
LYMPHS PCT: 26 % (ref 12–46)
Lymphs Abs: 1.3 10*3/uL (ref 0.7–4.0)
Lymphs Abs: 2 10*3/uL (ref 0.7–4.0)
MCH: 31.2 pg (ref 26.0–34.0)
MCH: 31.5 pg (ref 26.0–34.0)
MCHC: 34.5 g/dL (ref 30.0–36.0)
MCHC: 34.6 g/dL (ref 30.0–36.0)
MCV: 90.4 fL (ref 78.0–100.0)
MCV: 91.2 fL (ref 78.0–100.0)
MONO ABS: 0.6 10*3/uL (ref 0.1–1.0)
Monocytes Absolute: 0.7 10*3/uL (ref 0.1–1.0)
Monocytes Relative: 8 % (ref 3–12)
Monocytes Relative: 8 % (ref 3–12)
NEUTROS ABS: 5.1 10*3/uL (ref 1.7–7.7)
NEUTROS PCT: 63 % (ref 43–77)
Neutro Abs: 7.1 10*3/uL (ref 1.7–7.7)
Neutrophils Relative %: 77 % (ref 43–77)
PLATELETS: 215 10*3/uL (ref 150–400)
Platelets: 169 10*3/uL (ref 150–400)
RBC: 3.43 MIL/uL — ABNORMAL LOW (ref 4.22–5.81)
RBC: 2.95 MIL/uL — ABNORMAL LOW (ref 4.22–5.81)
RDW: 13.8 % (ref 11.5–15.5)
RDW: 13.9 % (ref 11.5–15.5)
WBC: 9.3 10*3/uL (ref 4.0–10.5)
WBC: 7.9 10*3/uL (ref 4.0–10.5)

## 2013-07-26 LAB — BASIC METABOLIC PANEL
Anion gap: 10 (ref 5–15)
BUN: 20 mg/dL (ref 6–23)
CALCIUM: 9.3 mg/dL (ref 8.4–10.5)
CO2: 24 mEq/L (ref 19–32)
Chloride: 102 mEq/L (ref 96–112)
Creatinine, Ser: 1.48 mg/dL — ABNORMAL HIGH (ref 0.50–1.35)
GFR calc Af Amer: 54 mL/min — ABNORMAL LOW (ref >90)
GFR calc non Af Amer: 47 mL/min — ABNORMAL LOW (ref >90)
Glucose, Bld: 379 mg/dL — ABNORMAL HIGH (ref 70–99)
Potassium: 5.7 mEq/L — ABNORMAL HIGH (ref 3.7–5.3)
Sodium: 136 mEq/L — ABNORMAL LOW (ref 137–147)

## 2013-07-26 LAB — PROTIME-INR
INR: 1.19 (ref 0.00–1.49)
PROTHROMBIN TIME: 15.1 s (ref 11.6–15.2)

## 2013-07-26 LAB — POTASSIUM: Potassium: 5.5 mEq/L — ABNORMAL HIGH (ref 3.7–5.3)

## 2013-07-26 LAB — I-STAT CG4 LACTIC ACID, ED: LACTIC ACID, VENOUS: 1.78 mmol/L (ref 0.5–2.2)

## 2013-07-26 LAB — APTT: aPTT: 31 seconds (ref 24–37)

## 2013-07-26 LAB — HEMOGLOBIN A1C
Hgb A1c MFr Bld: 9.1 % — ABNORMAL HIGH (ref <5.7)
Mean Plasma Glucose: 214 mg/dL — ABNORMAL HIGH (ref <117)

## 2013-07-26 LAB — GLUCOSE, CAPILLARY
Glucose-Capillary: 214 mg/dL — ABNORMAL HIGH (ref 70–99)
Glucose-Capillary: 227 mg/dL — ABNORMAL HIGH (ref 70–99)

## 2013-07-26 LAB — PHOSPHORUS: PHOSPHORUS: 3.5 mg/dL (ref 2.3–4.6)

## 2013-07-26 LAB — ABO/RH: ABO/RH(D): A POS

## 2013-07-26 MED ORDER — VITAMIN D-3 25 MCG (1000 UT) PO CAPS: 2000.0000 [IU] | ORAL_CAPSULE | Freq: Every day | ORAL | Status: DC

## 2013-07-26 MED ORDER — ONDANSETRON HCL 4 MG/2ML IJ SOLN: 4.0000 mg | Freq: Three times a day (TID) | INTRAMUSCULAR | Status: AC | PRN

## 2013-07-26 MED ORDER — DILTIAZEM HCL ER COATED BEADS 360 MG PO CP24
360.0000 mg | ORAL_CAPSULE | Freq: Every day | ORAL | Status: DC
  Administered 2013-07-26 – 2013-07-30 (×5): 360 mg via ORAL
  Filled 2013-07-26 (×5): qty 1

## 2013-07-26 MED ORDER — ONDANSETRON HCL 4 MG/2ML IJ SOLN: 4.0000 mg | Freq: Four times a day (QID) | INTRAMUSCULAR | Status: DC | PRN

## 2013-07-26 MED ORDER — ACETAMINOPHEN 650 MG RE SUPP: 650.0000 mg | Freq: Four times a day (QID) | RECTAL | Status: DC | PRN

## 2013-07-26 MED ORDER — BUMETANIDE 2 MG PO TABS
2.0000 mg | ORAL_TABLET | Freq: Every day | ORAL | Status: DC
  Administered 2013-07-26 – 2013-07-27 (×2): 2 mg via ORAL
  Filled 2013-07-26 (×2): qty 1

## 2013-07-26 MED ORDER — HYDRALAZINE HCL 50 MG PO TABS
50.0000 mg | ORAL_TABLET | Freq: Two times a day (BID) | ORAL | Status: DC
  Administered 2013-07-26 – 2013-07-30 (×8): 50 mg via ORAL
  Filled 2013-07-26 (×9): qty 1

## 2013-07-26 MED ORDER — IOHEXOL 300 MG/ML  SOLN
100.0000 mL | Freq: Once | INTRAMUSCULAR | Status: AC | PRN
Start: 2013-07-26 — End: 2013-07-26
  Administered 2013-07-26: 100 mL via INTRAVENOUS

## 2013-07-26 MED ORDER — SODIUM CHLORIDE 0.9 % IV SOLN
INTRAVENOUS | Status: DC
  Administered 2013-07-26: 10:00:00 via INTRAVENOUS

## 2013-07-26 MED ORDER — CENTURY SENIOR PO TABS: 1.0000 | ORAL_TABLET | Freq: Every day | ORAL | Status: DC

## 2013-07-26 MED ORDER — INSULIN ASPART PROT & ASPART (70-30 MIX) 100 UNIT/ML ~~LOC~~ SUSP
60.0000 [IU] | Freq: Two times a day (BID) | SUBCUTANEOUS | Status: DC
  Filled 2013-07-26: qty 10

## 2013-07-26 MED ORDER — ACETAMINOPHEN 325 MG PO TABS: 650.0000 mg | ORAL_TABLET | Freq: Four times a day (QID) | ORAL | Status: DC | PRN

## 2013-07-26 MED ORDER — LABETALOL HCL 200 MG PO TABS
200.0000 mg | ORAL_TABLET | Freq: Two times a day (BID) | ORAL | Status: DC
  Administered 2013-07-26 – 2013-07-30 (×8): 200 mg via ORAL
  Filled 2013-07-26 (×9): qty 1

## 2013-07-26 MED ORDER — SODIUM CHLORIDE 0.9 % IV SOLN
INTRAVENOUS | Status: AC
  Administered 2013-07-26: 13:00:00 via INTRAVENOUS

## 2013-07-26 MED ORDER — INSULIN ASPART 100 UNIT/ML ~~LOC~~ SOLN
0.0000 [IU] | Freq: Three times a day (TID) | SUBCUTANEOUS | Status: DC
  Administered 2013-07-26: 5 [IU] via SUBCUTANEOUS
  Administered 2013-07-27 (×3): 8 [IU] via SUBCUTANEOUS
  Administered 2013-07-28: 5 [IU] via SUBCUTANEOUS
  Administered 2013-07-28: 3 [IU] via SUBCUTANEOUS
  Administered 2013-07-29: 2 [IU] via SUBCUTANEOUS
  Administered 2013-07-29 – 2013-07-30 (×3): 5 [IU] via SUBCUTANEOUS
  Administered 2013-07-30: 3 [IU] via SUBCUTANEOUS

## 2013-07-26 MED ORDER — INSULIN ASPART 100 UNIT/ML ~~LOC~~ SOLN
0.0000 [IU] | Freq: Every day | SUBCUTANEOUS | Status: DC
  Administered 2013-07-26 – 2013-07-27 (×2): 2 [IU] via SUBCUTANEOUS

## 2013-07-26 MED ORDER — IOHEXOL 300 MG/ML  SOLN
50.0000 mL | Freq: Once | INTRAMUSCULAR | Status: AC | PRN
  Administered 2013-07-26: 50 mL via ORAL

## 2013-07-26 MED ORDER — SODIUM CHLORIDE 0.9 % IJ SOLN
3.0000 mL | Freq: Two times a day (BID) | INTRAMUSCULAR | Status: DC
  Administered 2013-07-28 – 2013-07-30 (×2): 3 mL via INTRAVENOUS

## 2013-07-26 MED ORDER — OMEGA-3-ACID ETHYL ESTERS 1 G PO CAPS
1.0000 g | ORAL_CAPSULE | Freq: Every day | ORAL | Status: DC
  Administered 2013-07-26 – 2013-07-30 (×5): 1 g via ORAL
  Filled 2013-07-26 (×5): qty 1

## 2013-07-26 MED ORDER — SODIUM CHLORIDE 0.9 % IV SOLN
INTRAVENOUS | Status: AC
  Administered 2013-07-26 – 2013-07-27 (×3): via INTRAVENOUS

## 2013-07-26 MED ORDER — LOSARTAN POTASSIUM 50 MG PO TABS
100.0000 mg | ORAL_TABLET | Freq: Every day | ORAL | Status: DC
  Filled 2013-07-26: qty 2

## 2013-07-26 MED ORDER — SODIUM CHLORIDE 0.9 % IV SOLN
1.0000 g | Freq: Once | INTRAVENOUS | Status: AC
  Administered 2013-07-26: 1 g via INTRAVENOUS
  Filled 2013-07-26: qty 10

## 2013-07-26 MED ORDER — ONDANSETRON HCL 4 MG PO TABS: 4.0000 mg | ORAL_TABLET | Freq: Four times a day (QID) | ORAL | Status: DC | PRN

## 2013-07-26 MED ORDER — ATORVASTATIN CALCIUM 20 MG PO TABS
20.0000 mg | ORAL_TABLET | Freq: Every day | ORAL | Status: DC
  Administered 2013-07-26 – 2013-07-29 (×4): 20 mg via ORAL
  Filled 2013-07-26 (×5): qty 1

## 2013-07-26 MED ORDER — VITAMIN D3 25 MCG (1000 UNIT) PO TABS
2000.0000 [IU] | ORAL_TABLET | Freq: Every day | ORAL | Status: DC
  Administered 2013-07-26 – 2013-07-30 (×5): 2000 [IU] via ORAL
  Filled 2013-07-26 (×5): qty 2

## 2013-07-26 MED ORDER — PANTOPRAZOLE SODIUM 40 MG IV SOLR
40.0000 mg | Freq: Two times a day (BID) | INTRAVENOUS | Status: DC
  Administered 2013-07-26 – 2013-07-27 (×3): 40 mg via INTRAVENOUS
  Filled 2013-07-26 (×4): qty 40

## 2013-07-26 MED ORDER — ADULT MULTIVITAMIN W/MINERALS CH
1.0000 | ORAL_TABLET | Freq: Every day | ORAL | Status: DC
  Administered 2013-07-26 – 2013-07-30 (×5): 1 via ORAL
  Filled 2013-07-26 (×5): qty 1

## 2013-07-26 MED ORDER — INSULIN ASPART 100 UNIT/ML IV SOLN
8.0000 [IU] | Freq: Once | INTRAVENOUS | Status: AC
  Administered 2013-07-26: 8 [IU] via INTRAVENOUS
  Filled 2013-07-26: qty 0.08

## 2013-07-26 MED ORDER — PAROXETINE HCL 20 MG PO TABS
20.0000 mg | ORAL_TABLET | Freq: Every day | ORAL | Status: DC
  Administered 2013-07-26 – 2013-07-30 (×5): 20 mg via ORAL
  Filled 2013-07-26 (×7): qty 1

## 2013-07-26 NOTE — ED Notes (Signed)
Pt returned from CT. Fluids paused, will retrieve blood sample from IV site in 15 minutes.

## 2013-07-26 NOTE — ED Notes (Addendum)
Pt's wife, [name]: [phone].

## 2013-07-26 NOTE — H&P (Addendum)
[facility] History and Physical  Paul [name] HWE:[id number] DOB: 1945/10/27 DOA: 07/26/2013  Referring physician: ER physician PCP: Chancy Hurter, MD   Chief Complaint: rectal bleed  HPI:  68 year old male with past medical history of hypertension, dyslipidemia, diabetes, depression who presented to [facility] [place] ED 07/26/2013 with sudden onset rectal bleed. Patient reported he went to the bathroom at 2 AM at which time he had a bowel movement and he noticed bleeding per rectum which was quite significant. It has never happened before. He reports he does not take aspirin. He did take ibuprofen one week prior to this admission one time but not in the last few days. Patient reports painless rectal bleed, stool is soft, no diarrhea or constipation. No reports of fevers or chills. He did have associated left lower quadrant pain earlier today but this has now resolved. Patient was sharp, about 5-6/10 in intensity, nonradiating. He said he was eating nuts last evening which may have provoked the pain. No reports of chest pain, shortness of breath or palpitations. No GU complaints. No lightheadedness or loss of consciousness. In ED, blood pressure was 148/82, heart rate 60, T max 98.5 F, oxygen saturation 97% on room air. CT abdomen showed possible left colon diverticulosis but not diverticulitis. Blood work revealed hemoglobin of 10.7, potassium 5.7 and creatinine 1.48. He was admitted for further evaluation. In ED has consulted GI as well.  Assessment & Plan    Principal problem: Acute lower GI bleed/painless rectal bleed - Likely diverticular. Hemoglobin is stable at 10.7. Continue to monitor CBC - Appreciate GI consult and recommendations - Started IV fluids, normal saline at 100 cc an hour - Start Protonix 40 mg IV every 12 hours - Patient has no significant complaints of pain so or there is only placed for Tylenol when necessary - Keep n.p.o. until seen by GI.  Active problems: Acute  renal failure - Based on medical records review, patient had creatinine as high as 1.4 in 2014 by laboratory normal range seems to be up to 1.5 - On this admission creatinine is 1.48 which seems to be around patient's baseline - Continue IV fluids  - Followup BMP in the morning  Hypertension - Will restart blood pressure medications from home including metoprolol, Cardizem. Blood pressure is 163/67 at this time. - hold losartan due to renal insufficiency  Diabetes, uncontrolled, renal complications - Last E9F in April 2015 was 9.5 indicating poor glycemic control. Obtained A1c on this admission. - Restart home insulin regimen and sliding scale insulin in the hospital. Hyperkalemia - Secondary to potassium supplementations which patient is taking at home. We will not restart potassium. Patient was given calcium gluconate in ED. - hold losartan - Repeat admission labs. - No EKG findings.  DVT prophylaxis: SCDs bilaterally due to risk of bleeding  Radiological Exams on Admission: Ct Abdomen Pelvis W Contrast 07/26/2013   MPRESSION: 1. No abnormality of small bowel or large bowel to explain the melanoma other than left colon diverticulosis. No evidence of diverticulitis. 2. Small hypodense lesion within the liver is indeterminate. No comparison CTs are available. Depending on level of concern for malignancy, a contrast MRI of the abdomen could be performed. 3. Low-density lesions in the kidney are likely benign cysts. 4. Probable adrenal hyperplasia.      Code Status: Full Family Communication: Plan of care discussed with the patient  Disposition Plan: Admit for further evaluation  Leisa Lenz, MD  [facility] Pager [phone]  Review of Systems:  Constitutional:  Negative for fever, chills and malaise/fatigue. Negative for diaphoresis.  HENT: Negative for hearing loss, ear pain, nosebleeds, congestion, sore throat, neck pain, tinnitus and ear discharge.   Eyes: Negative for blurred  vision, double vision, photophobia, pain, discharge and redness.  Respiratory: Negative for cough, hemoptysis, sputum production, shortness of breath, wheezing and stridor.   Cardiovascular: Negative for chest pain, palpitations, orthopnea, claudication and leg swelling.  Gastrointestinal: per HPI.  Genitourinary: Negative for dysuria, urgency, frequency, hematuria and flank pain.  Musculoskeletal: Negative for myalgias, back pain, joint pain and falls.  Skin: Negative for itching and rash.  Neurological: Negative for dizziness and weakness. Negative for tingling, tremors, sensory change, speech change, focal weakness, loss of consciousness and headaches.  Endo/Heme/Allergies: Negative for environmental allergies and polydipsia. Does not bruise/bleed easily.  Psychiatric/Behavioral: Negative for suicidal ideas. The patient is not nervous/anxious.      Past Medical History  Diagnosis Date  . Diabetes mellitus     type 2  . Depression   . Hypertension   . Hyperlipidemia   . Microalbuminuria   . melanoma dx'd 12/2012    rt arm; surg    Past Surgical History  Procedure Laterality Date  . Tonsillectomy and adenoidectomy    . Mass excision  09/29/2011    Procedure: MINOR EXCISION OF MASS;  Surgeon: Pedro Earls, MD;  Location: [facility];  Service: General;  Laterality: Left;  Excision sebaceous cyst on neck   Social History:  reports that he quit smoking about 14 years ago. His smoking use included Cigars and Cigarettes. He smoked 0.00 packs per day. He does not have any smokeless tobacco history on file. He reports that he does not drink alcohol or use illicit drugs.  Allergies  Allergen Reactions  . Amlodipine Swelling    Family History:  Family History  Problem Relation Age of Onset  . Cancer Mother     breast  . Heart attack Mother   . Hypertension Father   . Cerebral aneurysm Father      Prior to Admission medications   Medication Sig Start Date End  Date Taking? Authorizing Provider  atorvastatin (LIPITOR) 20 MG tablet Take 20 mg by mouth daily at 6 PM.   Yes Historical Provider, MD  bumetanide (BUMEX) 2 MG tablet Take 2 mg by mouth daily.   Yes Historical Provider, MD  Cholecalciferol (VITAMIN D-3) 1000 UNITS CAPS Take 2,000 Units by mouth daily.   Yes Historical Provider, MD  diltiazem (CARDIZEM CD) 360 MG 24 hr capsule Take 360 mg by mouth daily with breakfast.   Yes Historical Provider, MD  hydrALAZINE (APRESOLINE) 50 MG tablet Take 50 mg by mouth 2 (two) times daily.   Yes Historical Provider, MD  insulin aspart protamine- aspart (NOVOLOG MIX 70/30) (70-30) 100 UNIT/ML injection Inject 60-80 Units into the skin 2 (two) times daily with a meal. On sliding scale   Yes Historical Provider, MD  labetalol (NORMODYNE) 200 MG tablet Take 200 mg by mouth 2 (two) times daily.   Yes Historical Provider, MD  losartan (COZAAR) 100 MG tablet Take 100 mg by mouth daily with breakfast.   Yes Historical Provider, MD  Multiple Vitamins-Minerals (CENTURY SENIOR) TABS Take 1 tablet by mouth daily.   Yes Historical Provider, MD  Omega-3 Fatty Acids (FISH OIL) 1200 MG CAPS Take 1,200 mg by mouth daily.   Yes Historical Provider, MD  PARoxetine (PAXIL) 20 MG tablet Take 20 mg by mouth daily.   Yes Historical  Provider, MD  potassium chloride SA (K-DUR,KLOR-CON) 20 MEQ tablet Take 20 mEq by mouth every evening.   Yes Historical Provider, MD   Physical Exam: Filed Vitals:   07/26/13 0907  BP: 150/63  Pulse: 71  Temp: 98.1 F (36.7 C)  TempSrc: Oral  Resp: 18  SpO2: 97%    Physical Exam  Constitutional: Appears well-developed and well-nourished. No distress.  HENT: Normocephalic. No tonsillar erythema or exudates Eyes: Conjunctivae and EOM are normal. PERRLA, no scleral icterus.  Neck: Normal ROM. Neck supple. No JVD. No tracheal deviation. No thyromegaly.  CVS: RRR, S1/S2 +, no murmurs, no gallops, no carotid bruit.  Pulmonary: Effort and breath  sounds normal, no stridor, rhonchi, wheezes, rales.  Abdominal: Soft. BS +,  no distension, tenderness in left lower quadrant, no rebound or guarding.  Musculoskeletal: Normal range of motion. No edema and no tenderness.  Lymphadenopathy: No lymphadenopathy noted, cervical, inguinal. Neuro: Alert. Normal reflexes, muscle tone coordination. No focal neurologic deficits. Skin: Skin is warm and dry. No rash noted. Not diaphoretic. No erythema. No pallor.  Psychiatric: Normal mood and affect. Behavior, judgment, thought content normal.   Labs on Admission:  Basic Metabolic Panel:  Recent Labs Lab 07/26/13 1005  NA 136*  K 5.7*  CL 102  CO2 24  GLUCOSE 379*  BUN 20  CREATININE 1.48*  CALCIUM 9.3   Liver Function Tests: No results found for this basename: AST, ALT, ALKPHOS, BILITOT, PROT, ALBUMIN,  in the last 168 hours No results found for this basename: LIPASE, AMYLASE,  in the last 168 hours No results found for this basename: AMMONIA,  in the last 168 hours CBC:  Recent Labs Lab 07/26/13 1005  WBC 9.3  NEUTROABS 7.1  HGB 10.7*  HCT 31.0*  MCV 90.4  PLT 215   Cardiac Enzymes: No results found for this basename: CKTOTAL, CKMB, CKMBINDEX, TROPONINI,  in the last 168 hours BNP: No components found with this basename: POCBNP,  CBG: No results found for this basename: GLUCAP,  in the last 168 hours  If 7PM-7AM, please contact night-coverage [email or web address] Password [email or web address] 07/26/2013, 12:15 PM

## 2013-07-26 NOTE — Progress Notes (Signed)
UR completed 

## 2013-07-26 NOTE — ED Notes (Addendum)
Per EMS. Pt from home. Reports having 4-5 tarry stools since 0200 this am. Initially had RUQ pain, but resolved prior to EMS arrival. Has not eaten today, no hx of rectal bleeding. Nauseated but no vomiting. Pt reports he had some bright red blood with stool as well

## 2013-07-26 NOTE — ED Notes (Signed)
Bed: XG33 Expected date:  Expected time:  Means of arrival:  Comments: Tarry stools

## 2013-07-26 NOTE — ED Notes (Signed)
Pt states that he has been taking a non-potassium sparing diuretic medication and so also takes potassium medication to prevent hypokalemia. Pt states that he stopped taking the diuretic 3 days ago, but has continued taking the potassium supplement.

## 2013-07-26 NOTE — ED Provider Notes (Signed)
CSN: [id number]     Arrival date & time 07/26/13  0905 History   First MD Initiated Contact with Patient 07/26/13 0915     Chief Complaint  Patient presents with  . Rectal Bleeding     (Consider location/radiation/quality/duration/timing/severity/associated sxs/prior Treatment) HPI Comments: 68 year old male with history of diabetes, lipids, obesity, anemia, high blood pressure, diverticulosis, past smoker presents with medium color rectal bleeding for large episodes since last evening. No history of similar. Patient is mild pressure/ache left lower quadrant. No history of diverticulitis. Patient denies blood thinner use, atrial fibrillation or fevers or chills. Patient felt pale earlier this morning. Patient never seen blood in the past. Symptoms are intermittent. Patient denies history of ulcer or other GI issues and patient unsure his last gastroenterologist in 2011 when he had his last scope.  Patient is a 68 y.o. male presenting with hematochezia. The history is provided by the patient.  Rectal Bleeding   Past Medical History  Diagnosis Date  . Diabetes mellitus     type 2  . Depression   . Hypertension   . Hyperlipidemia   . Microalbuminuria    Past Surgical History  Procedure Laterality Date  . Tonsillectomy and adenoidectomy    . Mass excision  09/29/2011    Procedure: MINOR EXCISION OF MASS;  Surgeon: Pedro Earls, MD;  Location: [facility] [place];  Service: General;  Laterality: Left;  Excision sebaceous cyst on neck   Family History  Problem Relation Age of Onset  . Cancer Mother     breast  . Heart attack Mother   . Hypertension Father   . Cerebral aneurysm Father    History  Substance Use Topics  . Smoking status: Former Smoker    Types: Cigars, Cigarettes    Quit date: 09/13/1998  . Smokeless tobacco: Not on file  . Alcohol Use: No    Review of Systems  Gastrointestinal: Positive for hematochezia.      Allergies  Amlodipine  Home  Medications   Prior to Admission medications   Medication Sig Start Date End Date Taking? Authorizing Provider  atorvastatin (LIPITOR) 20 MG tablet TAKE 1 TABLET DAILY 06/27/13   Zenaida Niece, PA-C  bumetanide (BUMEX) 2 MG tablet Take 1 tablet (2 mg total) by mouth daily. 06/27/13   Zenaida Niece, PA-C  diltiazem (CARDIZEM CD) 360 MG 24 hr capsule Take 1 capsule (360 mg total) by mouth daily. 06/27/13   Zenaida Niece, PA-C  hydrALAZINE (APRESOLINE) 50 MG tablet Take 1 tablet (50 mg total) by mouth 3 (three) times daily. 06/27/13   Zenaida Niece, PA-C  Insulin Aspart Prot & Aspart (70-30) 100 UNIT/ML SUPN Inject 80 Units into the skin 2 (two) times daily with a meal. and pen needles 2/day 09/22/12   Renato Shin, MD  labetalol (NORMODYNE) 200 MG tablet Take 1 tablet (200 mg total) by mouth 2 (two) times daily. 07/25/13   Zenaida Niece, PA-C  losartan (COZAAR) 100 MG tablet Take 1 tablet (100 mg total) by mouth daily. 06/27/13 07/11/15  Zenaida Niece, PA-C  NOVOLOG MIX 70/30 FLEXPEN (70-30) 100 UNIT/ML Pen Inject subcutaneously 50  units two times daily with  a meal 03/09/13   Renato Shin, MD  PARoxetine (PAXIL) 20 MG tablet Take 1 tablet (20 mg total) by mouth daily. 06/27/13   Zenaida Niece, PA-C  potassium chloride SA (KLOR-CON M20) 20 MEQ tablet Take 1 tablet (20 mEq total) by mouth daily. 06/27/13  Zenaida Niece, PA-C   BP 150/63  Pulse 71  Temp(Src) 98.1 F (36.7 C) (Oral)  Resp 18  SpO2 97% Physical Exam  Nursing note and vitals reviewed. Constitutional: He is oriented to person, place, and time. He appears well-developed and well-nourished.  HENT:  Head: Normocephalic and atraumatic.  Eyes: Conjunctivae are normal. Right eye exhibits no discharge. Left eye exhibits no discharge.  Neck: Normal range of motion. Neck supple. No tracheal deviation present.  Cardiovascular: Normal rate and regular rhythm.   Pulmonary/Chest: Effort normal and breath sounds normal.   Abdominal: Soft. He exhibits no distension. There is tenderness (mild left lower quadrant). There is no guarding.  Genitourinary:  Patient is a sample of gross medium color blood/stool, loose  Musculoskeletal: He exhibits no edema.  Neurological: He is alert and oriented to person, place, and time.  Skin: Skin is warm. No rash noted. There is pallor (mild).  Psychiatric: He has a normal mood and affect.    ED Course  Procedures (including critical care time) Labs Review Labs Reviewed  BASIC METABOLIC PANEL - Abnormal; Notable for the following:    Sodium 136 (*)    Potassium 5.7 (*)    Glucose, Bld 379 (*)    Creatinine, Ser 1.48 (*)    GFR calc non Af Amer 47 (*)    GFR calc Af Amer 54 (*)    All other components within normal limits  CBC WITH DIFFERENTIAL - Abnormal; Notable for the following:    RBC 3.43 (*)    Hemoglobin 10.7 (*)    HCT 31.0 (*)    All other components within normal limits  POTASSIUM  I-STAT CG4 LACTIC ACID, ED  TYPE AND SCREEN  ABO/RH    Imaging Review Ct Abdomen Pelvis W Contrast  07/26/2013   CLINICAL DATA:  Rectal bleeding. Left lower quadrant pain. History of melanoma.  EXAM: CT ABDOMEN AND PELVIS WITH CONTRAST  TECHNIQUE: Multidetector CT imaging of the abdomen and pelvis was performed using the standard protocol following bolus administration of intravenous contrast.  CONTRAST:  174mL OMNIPAQUE IOHEXOL 300 MG/ML  SOLN  COMPARISON:  None available  FINDINGS: Lung bases are clear.  No pericardial fluid.  Ill-defined low-density lesion in the right hepatic lobe and (segment 7/8) measuring 12 mm on image 16, series 2. Gallbladder, pancreas, spleen, the spleen are normal. The adrenal glands are nodular likely representing hyperplasia. There is a low-density rounded lesion within the cortex of the right kidney with simple fluid attenuation likely representing a benign cyst. Other smaller cortical hypodensities are too small to characterize.  The stomach, small  bowel, appendix, cecum are normal. The colon demonstrates diverticula through the sigmoid region without acute inflammation. No evidence of mass or obstruction.  Abdominal aorta is normal caliber. No retroperitoneal periportal lymphadenopathy  No free fluid the pelvis. The prostate gland and bladder normal. No pelvic lymphadenopathy. No aggressive osseous lesion.  IMPRESSION: 1. No abnormality of small bowel or large bowel to explain the melanoma other than left colon diverticulosis. No evidence of diverticulitis. 2. Small hypodense lesion within the liver is indeterminate. No comparison CTs are available. Depending on level of concern for malignancy, a contrast MRI of the abdomen could be performed. 3. Low-density lesions in the kidney are likely benign cysts. 4. Probable adrenal hyperplasia.   Electronically Signed   By: Suzy Bouchard M.D.   On: 07/26/2013 11:38     EKG Interpretation   Date/Time:  Thursday July 26 2013 11:34:52 EDT  Ventricular Rate:  78 PR Interval:  171 QRS Duration: 138 QT Interval:  437 QTC Calculation: 498 R Axis:   -129 Text Interpretation:  Sinus rhythm Right bundle branch block Wide qrs, non  specific ST elevation.  Baseline wander in lead(s) II aVF V5 Confirmed by  Odessa Morren  MD, Shaton Lore ([id number]) on 07/26/2013 11:42:36 AM      MDM   Final diagnoses:  Diverticulosis of large intestine with hemorrhage  Lower GI bleed  CRF (chronic renal failure), unspecified stage  Hyperkalemia  Abnormal EKG  Hyperglycemia  Clinically concern for diverticulitis/diverticular bleed, discussed other differentials of patient's CT abdomen pelvis pending for further delineation. Blood work pending type and screen ordered due to 4 episodes of significant amount of blood. Plan for a minimum observation/telemetry in the hospital.  Patient with recurrent GI bleeding episodes most concerning for diverticular. CT scan no acute infection noted. No acute episodes in the ER. Discussed the case  with [facility] hospitalist who accepted on telemetry. Mild potassium elevated, repeat potassium ordered, EKG abnormal with mild widening QRS however no old to compare. Paged gastroenterology for consult for inpatient.  Calcium and insulin ordered for mild hyperkalemia and hyperglycemia. The patients results and plan were reviewed and discussed.   Any x-rays performed were personally reviewed by myself.   Differential diagnosis were considered with the presenting HPI.  Medications  0.9 %  sodium chloride infusion ( Intravenous New Bag/Given 07/26/13 1005)  0.9 %  sodium chloride infusion (not administered)  ondansetron (ZOFRAN) injection 4 mg (not administered)  iohexol (OMNIPAQUE) 300 MG/ML solution 50 mL (50 mLs Oral Contrast Given 07/26/13 0948)  iohexol (OMNIPAQUE) 300 MG/ML solution 100 mL (100 mLs Intravenous Contrast Given 07/26/13 1112)      Filed Vitals:   07/26/13 0907  BP: 150/63  Pulse: 71  Temp: 98.1 F (36.7 C)  TempSrc: Oral  Resp: 18  SpO2: 97%    Admission/ observation were discussed with the admitting physician, patient and/or family and they are comfortable with the plan.     Mariea Clonts, MD 07/26/13 1200

## 2013-07-26 NOTE — ED Notes (Signed)
Patient transported to CT 

## 2013-07-26 NOTE — ED Notes (Signed)
Patient is out of the room in xray, will complete EKG when patient returns

## 2013-07-27 DIAGNOSIS — E119 Type 2 diabetes mellitus without complications: Secondary | ICD-10-CM

## 2013-07-27 DIAGNOSIS — E875 Hyperkalemia: Secondary | ICD-10-CM

## 2013-07-27 DIAGNOSIS — E669 Obesity, unspecified: Secondary | ICD-10-CM

## 2013-07-27 DIAGNOSIS — D649 Anemia, unspecified: Secondary | ICD-10-CM

## 2013-07-27 DIAGNOSIS — K5731 Diverticulosis of large intestine without perforation or abscess with bleeding: Principal | ICD-10-CM

## 2013-07-27 LAB — GLUCOSE, CAPILLARY
GLUCOSE-CAPILLARY: 256 mg/dL — AB (ref 70–99)
GLUCOSE-CAPILLARY: 282 mg/dL — AB (ref 70–99)
GLUCOSE-CAPILLARY: 280 mg/dL — AB (ref 70–99)
Glucose-Capillary: 264 mg/dL — ABNORMAL HIGH (ref 70–99)
Glucose-Capillary: 210 mg/dL — ABNORMAL HIGH (ref 70–99)

## 2013-07-27 LAB — CBC
HCT: 23.4 % — ABNORMAL LOW (ref 39.0–52.0)
HEMATOCRIT: 25.9 % — AB (ref 39.0–52.0)
Hemoglobin: 8.8 g/dL — ABNORMAL LOW (ref 13.0–17.0)
Hemoglobin: 8.1 g/dL — ABNORMAL LOW (ref 13.0–17.0)
MCH: 31.3 pg (ref 26.0–34.0)
MCH: 31.5 pg (ref 26.0–34.0)
MCHC: 34 g/dL (ref 30.0–36.0)
MCHC: 34.6 g/dL (ref 30.0–36.0)
MCV: 92.2 fL (ref 78.0–100.0)
MCV: 91.1 fL (ref 78.0–100.0)
PLATELETS: 176 10*3/uL (ref 150–400)
Platelets: 154 10*3/uL (ref 150–400)
RBC: 2.81 MIL/uL — AB (ref 4.22–5.81)
RBC: 2.57 MIL/uL — ABNORMAL LOW (ref 4.22–5.81)
RDW: 14.1 % (ref 11.5–15.5)
RDW: 14.2 % (ref 11.5–15.5)
WBC: 7.5 10*3/uL (ref 4.0–10.5)
WBC: 9.2 10*3/uL (ref 4.0–10.5)

## 2013-07-27 LAB — COMPREHENSIVE METABOLIC PANEL
ALBUMIN: 3.1 g/dL — AB (ref 3.5–5.2)
ALT: 34 U/L (ref 0–53)
AST: 20 U/L (ref 0–37)
Alkaline Phosphatase: 58 U/L (ref 39–117)
Anion gap: 10 (ref 5–15)
BUN: 17 mg/dL (ref 6–23)
CHLORIDE: 103 meq/L (ref 96–112)
CO2: 27 mEq/L (ref 19–32)
CREATININE: 1.53 mg/dL — AB (ref 0.50–1.35)
Calcium: 8.7 mg/dL (ref 8.4–10.5)
GFR calc Af Amer: 52 mL/min — ABNORMAL LOW (ref >90)
GFR calc non Af Amer: 45 mL/min — ABNORMAL LOW (ref >90)
Glucose, Bld: 241 mg/dL — ABNORMAL HIGH (ref 70–99)
Potassium: 4.1 mEq/L (ref 3.7–5.3)
SODIUM: 140 meq/L (ref 137–147)
Total Bilirubin: 0.4 mg/dL (ref 0.3–1.2)
Total Protein: 5.6 g/dL — ABNORMAL LOW (ref 6.0–8.3)

## 2013-07-27 MED ORDER — INSULIN ASPART PROT & ASPART (70-30 MIX) 100 UNIT/ML ~~LOC~~ SUSP
60.0000 [IU] | Freq: Two times a day (BID) | SUBCUTANEOUS | Status: DC
  Administered 2013-07-27 – 2013-07-28 (×2): 60 [IU] via SUBCUTANEOUS

## 2013-07-27 MED ORDER — PANTOPRAZOLE SODIUM 40 MG PO TBEC
40.0000 mg | DELAYED_RELEASE_TABLET | Freq: Two times a day (BID) | ORAL | Status: DC
  Administered 2013-07-27 – 2013-07-30 (×6): 40 mg via ORAL
  Filled 2013-07-27 (×7): qty 1

## 2013-07-27 NOTE — Consult Note (Signed)
Referring Provider: [facility] Primary Care Physician:  Chancy Hurter, MD Primary Gastroenterologist:  Dr.Kaplan  Reason for Consultation: Acute lower GI bleed  HPI: Paul [name] is a 68 y.o. male  Known to Dr. Deatra Ina from previous colonoscopy. Pt had colonoscopy in 11/11 with finding of multiple left colon polyps, and moderate diverticuosis of left colon. Path on polyps consistent  With 4 tubular adenomas.  Pt presented to the ER last pm after he had onset of rectal bleeding early in the morning hours yesterday which wakened him from sleep. He says he passed a lot of BRB in commode. Apparently had transient sharp, cramping which subsided. Pt had several episodes over the next few hours , and around 6 am started feeling lightheaded and decided to call EMS. He had one bloody BM after that, and none since. He feels fine now and is hungry. Pt is not on any blood thinners.No regular ASA or NSAIDS. Tends to stay somewhat constipated  On arrival to the ER he was hemodynamically stable. HGB 10.7. Ct Abd/pelvis shows left colon diverticuosis, no inflammatory change, also noted a 12 mm  hypodense  lesion in dome of right liver , indeterminate. Pt is stable this am- no further  Bleeding.  HGB is 8.8    Past Medical History  Diagnosis Date  . Diabetes mellitus     type 2  . Depression   . Hypertension   . Hyperlipidemia   . Microalbuminuria   . melanoma dx'd 12/2012    rt arm; surg     Past Surgical History  Procedure Laterality Date  . Tonsillectomy and adenoidectomy    . Mass excision  09/29/2011    Procedure: MINOR EXCISION OF MASS;  Surgeon: Pedro Earls, MD;  Location: [place];  Service: General;  Laterality: Left;  Excision sebaceous cyst on neck    Prior to Admission medications   Medication Sig Start Date End Date Taking? Authorizing Provider  atorvastatin (LIPITOR) 20 MG tablet Take 20 mg by mouth daily at 6 PM.   Yes Historical Provider, MD    bumetanide (BUMEX) 2 MG tablet Take 2 mg by mouth daily.   Yes Historical Provider, MD  Cholecalciferol (VITAMIN D-3) 1000 UNITS CAPS Take 2,000 Units by mouth daily.   Yes Historical Provider, MD  diltiazem (CARDIZEM CD) 360 MG 24 hr capsule Take 360 mg by mouth daily with breakfast.   Yes Historical Provider, MD  hydrALAZINE (APRESOLINE) 50 MG tablet Take 50 mg by mouth 2 (two) times daily.   Yes Historical Provider, MD  insulin aspart protamine- aspart (NOVOLOG MIX 70/30) (70-30) 100 UNIT/ML injection Inject 60-80 Units into the skin 2 (two) times daily with a meal. On sliding scale   Yes Historical Provider, MD  labetalol (NORMODYNE) 200 MG tablet Take 200 mg by mouth 2 (two) times daily.   Yes Historical Provider, MD  losartan (COZAAR) 100 MG tablet Take 100 mg by mouth daily with breakfast.   Yes Historical Provider, MD  Multiple Vitamins-Minerals (CENTURY SENIOR) TABS Take 1 tablet by mouth daily.   Yes Historical Provider, MD  Omega-3 Fatty Acids (FISH OIL) 1200 MG CAPS Take 1,200 mg by mouth daily.   Yes Historical Provider, MD  PARoxetine (PAXIL) 20 MG tablet Take 20 mg by mouth daily.   Yes Historical Provider, MD  potassium chloride SA (K-DUR,KLOR-CON) 20 MEQ tablet Take 20 mEq by mouth every evening.   Yes Historical Provider, MD    Current Facility-Administered Medications  Medication Dose Route Frequency Provider Last Rate Last Dose  . 0.9 %  sodium chloride infusion   Intravenous Continuous Robbie Lis, MD 125 mL/hr at 07/27/13 0146    . acetaminophen (TYLENOL) tablet 650 mg  650 mg Oral Q6H PRN Robbie Lis, MD       Or  . acetaminophen (TYLENOL) suppository 650 mg  650 mg Rectal Q6H PRN Robbie Lis, MD      . atorvastatin (LIPITOR) tablet 20 mg  20 mg Oral q1800 Robbie Lis, MD   20 mg at 07/26/13 1755  . bumetanide (BUMEX) tablet 2 mg  2 mg Oral Daily Robbie Lis, MD   2 mg at 07/26/13 1755  . cholecalciferol (VITAMIN D) tablet 2,000 Units  2,000 Units Oral Daily  Robbie Lis, MD   2,000 Units at 07/26/13 1755  . diltiazem (CARDIZEM CD) 24 hr capsule 360 mg  360 mg Oral Daily Robbie Lis, MD   360 mg at 07/26/13 1755  . hydrALAZINE (APRESOLINE) tablet 50 mg  50 mg Oral BID Robbie Lis, MD   50 mg at 07/26/13 2104  . insulin aspart (novoLOG) injection 0-15 Units  0-15 Units Subcutaneous TID WC Robbie Lis, MD   5 Units at 07/26/13 1756  . insulin aspart (novoLOG) injection 0-5 Units  0-5 Units Subcutaneous QHS Robbie Lis, MD   2 Units at 07/26/13 2206  . insulin aspart protamine- aspart (NOVOLOG MIX 70/30) injection 60-80 Units  60-80 Units Subcutaneous BID WC Robbie Lis, MD      . labetalol (NORMODYNE) tablet 200 mg  200 mg Oral BID Robbie Lis, MD   200 mg at 07/26/13 2103  . multivitamin with minerals tablet 1 tablet  1 tablet Oral Daily Robbie Lis, MD   1 tablet at 07/26/13 1756  . omega-3 acid ethyl esters (LOVAZA) capsule 1 g  1 g Oral Daily Robbie Lis, MD   1 g at 07/26/13 1756  . ondansetron (ZOFRAN) tablet 4 mg  4 mg Oral Q6H PRN Robbie Lis, MD       Or  . ondansetron [facility]) injection 4 mg  4 mg Intravenous Q6H PRN Robbie Lis, MD      . pantoprazole (PROTONIX) injection 40 mg  40 mg Intravenous Q12H Robbie Lis, MD   40 mg at 07/26/13 2105  . PARoxetine (PAXIL) tablet 20 mg  20 mg Oral Daily Robbie Lis, MD   20 mg at 07/26/13 1756  . sodium chloride 0.9 % injection 3 mL  3 mL Intravenous Q12H Robbie Lis, MD        Allergies as of 07/26/2013 - Review Complete 07/26/2013  Allergen Reaction Noted  . Amlodipine Swelling 04/30/2013    Family History  Problem Relation Age of Onset  . Cancer Mother     breast  . Heart attack Mother   . Hypertension Father   . Cerebral aneurysm Father     History   Social History  . Marital Status: Married    Spouse Name: N/A    Number of Children: N/A  . Years of Education: N/A   Occupational History  . Not on file.   Social History Main Topics  . Smoking  status: Former Smoker    Types: Cigars, Cigarettes    Quit date: 09/13/1998  . Smokeless tobacco: Never Used  . Alcohol Use: No  . Drug Use: No  . Sexual  Activity: No   Other Topics Concern  . Not on file   Social History Narrative  . No narrative on file    Review of Systems: Pertinent positive and negative review of systems were noted in the above HPI section.  All other review of systems was otherwise negative.n.  Physical Exam: Vital signs in last 24 hours: Temp:  [98.1 F (36.7 C)-98.3 F (36.8 C)] 98.2 F (36.8 C) (07/10 0435) Pulse Rate:  [73-92] 77 (07/10 0435) Resp:  [18-20] 20 (07/10 0435) BP: (145-174)/(67-84) 150/68 mmHg (07/10 0435) SpO2:  [95 %-99 %] 98 % (07/10 0435) Weight:  [225 lb 9.6 oz (102.331 kg)-227 lb 15.3 oz (103.4 kg)] 225 lb 9.6 oz (102.331 kg) (07/10 0656) Last BM Date: 07/26/13 General:   Alert,  Well-developed, obese WM,well-nourished, pleasant and cooperative in NAD Head:  Normocephalic and atraumatic. Eyes:  Sclera clear, no icterus.   Conjunctiva pale  Ears:  Normal auditory acuity. Nose:  No deformity, discharge,  or lesions. Mouth:  No deformity or lesions.   Neck:  Supple; no masses or thyromegaly. Lungs:  Clear throughout to auscultation.   No wheezes, crackles, or rhonchi. Heart:  Regular rate and rhythm; no murmurs, clicks, rubs,  or gallops. Abdomen:  Soft,nontender, BS active,nonpalp mass or hsm.   Rectal:  Deferred  Msk:  Symmetrical without gross deformities. . Pulses:  Normal pulses noted. Extremities:  Without clubbing or edema. Neurologic:  Alert and  oriented x4;  grossly normal neurologically. Skin:  Intact without significant lesions or rashes.. Psych:  Alert and cooperative. Normal mood and affect.  Intake/Output from previous day: 07/09 0701 - 07/10 0700 In: 1560 [P.O.:60; I.V.:1500] Out: 2950 [Urine:2950] Intake/Output this shift: Total I/O In: -  Out: 200 [Urine:200]  Lab Results:  Recent Labs   07/26/13 1005 07/26/13 1512 07/27/13 0435  WBC 9.3 7.9 7.5  HGB 10.7* 9.3* 8.8*  HCT 31.0* 26.9* 25.9*  PLT 215 169 154   BMET  Recent Labs  07/26/13 1005 07/26/13 1157 07/26/13 1512 07/27/13 0435  NA 136*  --  139 140  K 5.7* 5.5* 5.2 4.1  CL 102  --  104 103  CO2 24  --  26 27  GLUCOSE 379*  --  259* 241*  BUN 20  --  18 17  CREATININE 1.48*  --  1.52* 1.53*  CALCIUM 9.3  --  9.2 8.7   LFT  Recent Labs  07/27/13 0435  PROT 5.6*  ALBUMIN 3.1*  AST 20  ALT 34  ALKPHOS 58  BILITOT 0.4   PT/INR  Recent Labs  07/26/13 1512  LABPROT 15.1  INR 1.19    IMPRESSION:  #1  68 yo  Male with  Acute lower GI Bleed most consistent with Diverticular bleed. Hopefully has stopped, last Bm yesterday. #2  Anemia- secondary to acute blood loss #3 Hx of Adenomatous polyps- due for follow up #4 AODM #5 HTN #6 hx of melanoma-right arm  PLAN: #1 Advance diet today #2 serial HGbs, and transfuse for hgb 8 or less #3 no plans for endoscopic intervention at this time- will plan to follow up in office ,repeat labs and schedule for  colonoscopy at that time #4 no ASA or Nsaids for at least 2 weeks  If remains quite stable , no further bleeding he may be able to be discharged tomorrow, add benefiber daily, an oral OTC fe supplement for one month   Amy Esterwood  07/27/2013, 9:12 AM     ________________________________________________________________________  [facility] MD note:  I personally examined the patient, reviewed the data and agree with the assessment and plan described above.  Bleeding seems to have already stopped.  If he does not rebleed, he is OK to d/c tomorrow.  Should be on daily iron supplement (once daily), we will plan on follow up in 3-4 weeks to repeat labs and schedule surveillance colonoscopy.     Owens Loffler, MD [facility] Gastroenterology Pager [phone]

## 2013-07-27 NOTE — Discharge Instructions (Signed)
No Aspirin, aleve ,ibuprofen for 2 weeks .  Start Benefiber daily in 8 oz of liquid  For bowels Start OTC ferrous sulfate  Daily for one month

## 2013-07-27 NOTE — Clinical Documentation Improvement (Signed)
Possible Clinical Conditions?   CKD Stage I - GFR > OR = 90 CKD Stage II - GFR 60-80 CKD Stage III - GFR 30-59 CKD Stage IV - GFR 15-29 CKD Stage V - GFR < 15 ESRD (End Stage Renal Disease) Other condition_____________ Cannot Clinically determine   Supporting Information:  Risk Factors:  history of diabetes & hypertention ED provider note:CRF (chronic renal failure), unspecified stage   Diagnostics: BUN/CR/GFR 7/9@1005  = 20/1.48/47 7/9@ 1512 = 18/1.52/45 7/10@0435  = 17/1.53/45  Treatment: Daily weights I&O qshift  Thank You, Estella Husk ,RN Clinical Documentation Specialist:  [place] Information Management

## 2013-07-27 NOTE — Progress Notes (Signed)
[facility] PROGRESS NOTE   Paul [name] DGL:[id number] DOB: 14-Jul-1945 DOA: 07/26/2013 PCP: Chancy Hurter, MD  HPI/Subjective: Feels better, wants it  Assessment/Plan: Principal Problem:   Rectal bleed Active Problems:   DIABETES MELLITUS, TYPE II   HYPERLIPIDEMIA   DEPRESSION   HYPERTENSION   Lower GI bleed   Acute lower GI bleed/painless rectal bleed  - Likely diverticular. Hemoglobin is stable at 10.7. Continue to monitor CBC  - Appreciate GI consult and recommendations  - Started IV fluids, normal saline at 100 cc an hour  - Start Protonix 40 mg IV every 12 hours  - Patient has no significant complaints of pain so or there is only placed for Tylenol when necessary  - GI recommended to start diet. Colonoscopy as outpatient.  Acute kidney injury - Based on medical records review, patient had creatinine as high as 1.4 in 2014 by laboratory normal range seems to be up to 1.5  - On this admission creatinine is 1.48 which seems to be around patient's baseline  - Continue IV fluids  - Followup BMP in the morning   Hypertension  - Will restart blood pressure medications from home including metoprolol, Cardizem. Blood pressure is 163/67 at this time.  - hold losartan due to renal insufficiency   Diabetes, uncontrolled, renal complications  - Hemoglobin A1c is 9.1, indicating uncontrolled diabetes mellitus. - Restart home insulin regimen and sliding scale insulin in the hospital.   Hyperkalemia  -Likely secondary to potassium supplementation and losartan, both held. -Potassium is 4.1 today.  Code Status: Full code Family Communication: Plan discussed with the patient. Disposition Plan: Remains inpatient   Consultants:  GI  Procedures:  None  Antibiotics:  None   Objective: Filed Vitals:   07/27/13 1003  BP: 167/73  Pulse:   Temp:   Resp:     Intake/Output Summary (Last 24 hours) at 07/27/13 1230 Last data filed at 07/27/13 0800  Gross per 24 hour  Intake   1560 ml  Output   3150 ml  Net  -1590 ml   Filed Weights   07/26/13 1334 07/27/13 0656  Weight: 103.4 kg (227 lb 15.3 oz) 102.331 kg (225 lb 9.6 oz)    Exam: General: Alert and awake, oriented x3, not in any acute distress. HEENT: anicteric sclera, pupils reactive to light and accommodation, EOMI CVS: S1-S2 clear, no murmur rubs or gallops Chest: clear to auscultation bilaterally, no wheezing, rales or rhonchi Abdomen: soft nontender, nondistended, normal bowel sounds, no organomegaly Extremities: no cyanosis, clubbing or edema noted bilaterally Neuro: Cranial nerves II-XII intact, no focal neurological deficits  Data Reviewed: Basic Metabolic Panel:  Recent Labs Lab 07/26/13 1005 07/26/13 1157 07/26/13 1512 07/27/13 0435  NA 136*  --  139 140  K 5.7* 5.5* 5.2 4.1  CL 102  --  104 103  CO2 24  --  26 27  GLUCOSE 379*  --  259* 241*  BUN 20  --  18 17  CREATININE 1.48*  --  1.52* 1.53*  CALCIUM 9.3  --  9.2 8.7  MG  --   --  2.0  --   PHOS  --   --  3.5  --    Liver Function Tests:  Recent Labs Lab 07/26/13 1512 07/27/13 0435  AST 21 20  ALT 39 34  ALKPHOS 62 58  BILITOT 0.3 0.4  PROT 5.5* 5.6*  ALBUMIN 2.9* 3.1*   No results found for this basename: LIPASE, AMYLASE,  in the  last 168 hours No results found for this basename: AMMONIA,  in the last 168 hours CBC:  Recent Labs Lab 07/26/13 1005 07/26/13 1512 07/27/13 0435  WBC 9.3 7.9 7.5  NEUTROABS 7.1 5.1  --   HGB 10.7* 9.3* 8.8*  HCT 31.0* 26.9* 25.9*  MCV 90.4 91.2 92.2  PLT 215 169 154   Cardiac Enzymes: No results found for this basename: CKTOTAL, CKMB, CKMBINDEX, TROPONINI,  in the last 168 hours BNP (last 3 results)  Recent Labs  04/30/13 1026  PROBNP 54.0   CBG:  Recent Labs Lab 07/26/13 1709 07/26/13 2157 07/27/13 0746 07/27/13 1002 07/27/13 1133  GLUCAP 214* 227* 256* 282* 280*    Micro No results found for this or any previous visit (from  the past 240 hour(s)).   Studies: Ct Abdomen Pelvis W Contrast  07/26/2013   CLINICAL DATA:  Rectal bleeding. Left lower quadrant pain. History of melanoma.  EXAM: CT ABDOMEN AND PELVIS WITH CONTRAST  TECHNIQUE: Multidetector CT imaging of the abdomen and pelvis was performed using the standard protocol following bolus administration of intravenous contrast.  CONTRAST:  13mL OMNIPAQUE IOHEXOL 300 MG/ML  SOLN  COMPARISON:  None available  FINDINGS: Lung bases are clear.  No pericardial fluid.  Ill-defined low-density lesion in the right hepatic lobe and (segment 7/8) measuring 12 mm on image 16, series 2. Gallbladder, pancreas, spleen, the spleen are normal. The adrenal glands are nodular likely representing hyperplasia. There is a low-density rounded lesion within the cortex of the right kidney with simple fluid attenuation likely representing a benign cyst. Other smaller cortical hypodensities are too small to characterize.  The stomach, small bowel, appendix, cecum are normal. The colon demonstrates diverticula through the sigmoid region without acute inflammation. No evidence of mass or obstruction.  Abdominal aorta is normal caliber. No retroperitoneal periportal lymphadenopathy  No free fluid the pelvis. The prostate gland and bladder normal. No pelvic lymphadenopathy. No aggressive osseous lesion.  IMPRESSION: 1. No abnormality of small bowel or large bowel to explain the melanoma other than left colon diverticulosis. No evidence of diverticulitis. 2. Small hypodense lesion within the liver is indeterminate. No comparison CTs are available. Depending on level of concern for malignancy, a contrast MRI of the abdomen could be performed. 3. Low-density lesions in the kidney are likely benign cysts. 4. Probable adrenal hyperplasia.   Electronically Signed   By: Suzy Bouchard M.D.   On: 07/26/2013 11:38    Scheduled Meds: . atorvastatin  20 mg Oral q1800  . bumetanide  2 mg Oral Daily  .  cholecalciferol  2,000 Units Oral Daily  . diltiazem  360 mg Oral Daily  . hydrALAZINE  50 mg Oral BID  . insulin aspart  0-15 Units Subcutaneous TID WC  . insulin aspart  0-5 Units Subcutaneous QHS  . insulin aspart protamine- aspart  60-80 Units Subcutaneous BID WC  . labetalol  200 mg Oral BID  . multivitamin with minerals  1 tablet Oral Daily  . omega-3 acid ethyl esters  1 g Oral Daily  . pantoprazole  40 mg Oral BID  . PARoxetine  20 mg Oral Daily  . sodium chloride  3 mL Intravenous Q12H   Continuous Infusions: . sodium chloride 125 mL/hr at 07/27/13 1051       Time spent: 35 minutes    [facility] A  [facility] Pager [phone] If 7PM-7AM, please contact night-coverage at [email or web address], password [facility] 07/27/2013, 12:30 PM  LOS: 1 day

## 2013-07-27 NOTE — Progress Notes (Signed)
Inpatient Diabetes Program Recommendations  AACE/ADA: New Consensus Statement on Inpatient Glycemic Control (2013)  Target Ranges:  Prepandial:   less than 140 mg/dL      Peak postprandial:   less than 180 mg/dL (1-2 hours)      Critically ill patients:  140 - 180 mg/dL  Results for EDREES, VALENT (MRN [id number]) as of 07/27/2013 08:16  Ref. Range 07/26/2013 17:09 07/26/2013 21:57 07/27/2013 07:46  Glucose-Capillary Latest Range: 70-99 mg/dL 214 (H) 227 (H) 256 (H)    Inpatient Diabetes Program Recommendations Insulin - Basal: Start Levemir 25 units   Note: Since patient is NPO, please consider discontinuing 70/30 and use basal/bolus therapy while hospitalized.   Patient will not receive 70/30 while NPO. Thank you  Raoul Pitch BSN, RN,CDE Inpatient Diabetes Coordinator [phone] (team pager)

## 2013-07-28 DIAGNOSIS — R9431 Abnormal electrocardiogram [ECG] [EKG]: Secondary | ICD-10-CM

## 2013-07-28 LAB — BASIC METABOLIC PANEL
Anion gap: 12 (ref 5–15)
BUN: 24 mg/dL — ABNORMAL HIGH (ref 6–23)
CO2: 25 meq/L (ref 19–32)
CREATININE: 1.76 mg/dL — AB (ref 0.50–1.35)
Calcium: 8.7 mg/dL (ref 8.4–10.5)
Chloride: 104 mEq/L (ref 96–112)
GFR calc Af Amer: 44 mL/min — ABNORMAL LOW (ref >90)
GFR calc non Af Amer: 38 mL/min — ABNORMAL LOW (ref >90)
Glucose, Bld: 246 mg/dL — ABNORMAL HIGH (ref 70–99)
Potassium: 3.9 mEq/L (ref 3.7–5.3)
Sodium: 141 mEq/L (ref 137–147)

## 2013-07-28 LAB — GLUCOSE, CAPILLARY
GLUCOSE-CAPILLARY: 250 mg/dL — AB (ref 70–99)
GLUCOSE-CAPILLARY: 95 mg/dL (ref 70–99)
Glucose-Capillary: 193 mg/dL — ABNORMAL HIGH (ref 70–99)
Glucose-Capillary: 146 mg/dL — ABNORMAL HIGH (ref 70–99)

## 2013-07-28 LAB — CBC
HEMATOCRIT: 22.1 % — AB (ref 39.0–52.0)
Hemoglobin: 7.5 g/dL — ABNORMAL LOW (ref 13.0–17.0)
MCH: 30.9 pg (ref 26.0–34.0)
MCHC: 33.9 g/dL (ref 30.0–36.0)
MCV: 90.9 fL (ref 78.0–100.0)
Platelets: 149 10*3/uL — ABNORMAL LOW (ref 150–400)
RBC: 2.43 MIL/uL — ABNORMAL LOW (ref 4.22–5.81)
RDW: 13.9 % (ref 11.5–15.5)
WBC: 7.6 10*3/uL (ref 4.0–10.5)

## 2013-07-28 LAB — PREPARE RBC (CROSSMATCH)

## 2013-07-28 MED ORDER — INSULIN ASPART PROT & ASPART (70-30 MIX) 100 UNIT/ML ~~LOC~~ SUSP
75.0000 [IU] | Freq: Two times a day (BID) | SUBCUTANEOUS | Status: DC
  Administered 2013-07-28: 35 [IU] via SUBCUTANEOUS
  Administered 2013-07-29 – 2013-07-30 (×3): 75 [IU] via SUBCUTANEOUS

## 2013-07-28 MED ORDER — SODIUM CHLORIDE 0.9 % IV SOLN
INTRAVENOUS | Status: DC
  Administered 2013-07-28 – 2013-07-30 (×3): via INTRAVENOUS

## 2013-07-28 NOTE — Progress Notes (Signed)
[facility] PROGRESS NOTE   Paul [name] BSW:[id number] DOB: [birth year] DOA: 07/26/2013 PCP: Chancy Hurter, MD  HPI/Subjective: 2 bowel movements since yesterday morning, blood mixed with stools. Hemoglobin dropped to 7.5, being transfused 2 units of blood  Assessment/Plan: Principal Problem:   Rectal bleed Active Problems:   DIABETES MELLITUS, TYPE II   HYPERLIPIDEMIA   DEPRESSION   HYPERTENSION   Lower GI bleed   Acute lower GI bleed/painless rectal bleed  - Likely diverticular. Hemoglobin is stable at 10.7. Continue to monitor CBC  - Appreciate GI consult and recommendations  - Started IV fluids, normal saline at 100 cc an hour  - Start Protonix 40 mg IV every 12 hours  - Patient has no significant complaints of pain so or there is only placed for Tylenol when necessary  - Transfuse 2 units of blood, keep in the hospital, still on carbohydrate diet GI please advise if we can be n.p.o. or clear liquids.  Acute kidney injury - Patient had creatinine as high as 1.4 in 2014 by laboratory normal range seems to be up to 1.5  - Creatinine went up to 1.76, IV fluids restarted. - Followup BMP in the morning.  Hypertension  - Will restart blood pressure medications from home including metoprolol, Cardizem. Blood pressure is 163/67 at this time.  - hold losartan due to renal insufficiency   Diabetes, uncontrolled, renal complications  - Hemoglobin A1c is 9.1, indicating uncontrolled diabetes mellitus. - Restart home insulin regimen and sliding scale insulin in the hospital.   Hyperkalemia  -Likely secondary to potassium supplementation and losartan, both held. -Potassium is 4.1 today.  Code Status: Full code Family Communication: Plan discussed with the patient. Disposition Plan: Remains inpatient   Consultants:  GI  Procedures:  None  Antibiotics:  None   Objective: Filed Vitals:   07/28/13 1105  BP: 136/60  Pulse: 63  Temp: 98.3 F (36.8  C)  Resp: 18    Intake/Output Summary (Last 24 hours) at 07/28/13 1122 Last data filed at 07/28/13 0912  Gross per 24 hour  Intake 1034.58 ml  Output    750 ml  Net 284.58 ml   Filed Weights   07/26/13 1334 07/27/13 0656 07/28/13 0601  Weight: 103.4 kg (227 lb 15.3 oz) 102.331 kg (225 lb 9.6 oz) 103.874 kg (229 lb)    Exam: General: Alert and awake, oriented x3, not in any acute distress. HEENT: anicteric sclera, pupils reactive to light and accommodation, EOMI CVS: S1-S2 clear, no murmur rubs or gallops Chest: clear to auscultation bilaterally, no wheezing, rales or rhonchi Abdomen: soft nontender, nondistended, normal bowel sounds, no organomegaly Extremities: no cyanosis, clubbing or edema noted bilaterally Neuro: Cranial nerves II-XII intact, no focal neurological deficits  Data Reviewed: Basic Metabolic Panel:  Recent Labs Lab 07/26/13 1005 07/26/13 1157 07/26/13 1512 07/27/13 0435 07/28/13 0438  NA 136*  --  139 140 141  K 5.7* 5.5* 5.2 4.1 3.9  CL 102  --  104 103 104  CO2 24  --  26 27 25   GLUCOSE 379*  --  259* 241* 246*  BUN 20  --  18 17 24*  CREATININE 1.48*  --  1.52* 1.53* 1.76*  CALCIUM 9.3  --  9.2 8.7 8.7  MG  --   --  2.0  --   --   PHOS  --   --  3.5  --   --    Liver Function Tests:  Recent Labs Lab 07/26/13 1512  07/27/13 0435  AST 21 20  ALT 39 34  ALKPHOS 62 58  BILITOT 0.3 0.4  PROT 5.5* 5.6*  ALBUMIN 2.9* 3.1*   No results found for this basename: LIPASE, AMYLASE,  in the last 168 hours No results found for this basename: AMMONIA,  in the last 168 hours CBC:  Recent Labs Lab 07/26/13 1005 07/26/13 1512 07/27/13 0435 07/27/13 1659 07/28/13 0438  WBC 9.3 7.9 7.5 9.2 7.6  NEUTROABS 7.1 5.1  --   --   --   HGB 10.7* 9.3* 8.8* 8.1* 7.5*  HCT 31.0* 26.9* 25.9* 23.4* 22.1*  MCV 90.4 91.2 92.2 91.1 90.9  PLT 215 169 154 176 149*   Cardiac Enzymes: No results found for this basename: CKTOTAL, CKMB, CKMBINDEX, TROPONINI,  in  the last 168 hours BNP (last 3 results)  Recent Labs  04/30/13 1026  PROBNP 54.0   CBG:  Recent Labs Lab 07/27/13 1002 07/27/13 1133 07/27/13 1648 07/27/13 2112 07/28/13 0752  GLUCAP 282* 280* 264* 210* 250*    Micro No results found for this or any previous visit (from the past 240 hour(s)).   Studies: No results found.  Scheduled Meds: . atorvastatin  20 mg Oral q1800  . cholecalciferol  2,000 Units Oral Daily  . diltiazem  360 mg Oral Daily  . hydrALAZINE  50 mg Oral BID  . insulin aspart  0-15 Units Subcutaneous TID WC  . insulin aspart  0-5 Units Subcutaneous QHS  . insulin aspart protamine- aspart  75 Units Subcutaneous BID WC  . labetalol  200 mg Oral BID  . multivitamin with minerals  1 tablet Oral Daily  . omega-3 acid ethyl esters  1 g Oral Daily  . pantoprazole  40 mg Oral BID  . PARoxetine  20 mg Oral Daily  . sodium chloride  3 mL Intravenous Q12H   Continuous Infusions: . sodium chloride         Time spent: 35 minutes    [facility] A  [facility] Pager [phone] If 7PM-7AM, please contact night-coverage at [email or web address], password [facility] 07/28/2013, 11:22 AM  LOS: 2 days

## 2013-07-28 NOTE — Progress Notes (Addendum)
Cross cover [facility] Subjective: Chart reviewed. Patient admitted for a presumed diverticular bleed was anticipating discharge today but had some recurrent bleeding last night and received 2 units of PRBC's. He had another small volume BM this morning with some dark blood in it.  He denies having any abdominal pain, nausea or vomiting. His wife is at bedside. He is on a regular diet.   Objective: Vital signs in last 24 hours: Temp:  [97.7 F (36.5 C)-98.6 F (37 C)] 98.6 F (37 C) (07/11 1332) Pulse Rate:  [56-67] 61 (07/11 1332) Resp:  [18-22] 20 (07/11 1332) BP: (135-164)/(56-70) 146/59 mmHg (07/11 1332) SpO2:  [93 %-98 %] 97 % (07/11 1332) Weight:  [103.874 kg (229 lb)] 103.874 kg (229 lb) (07/11 0601) Last BM Date: 07/27/13  Intake/Output from previous day: 07/10 0701 - 07/11 0700 In: 720 [P.O.:720] Out: 1450 [Urine:1450] Intake/Output this shift: Total I/O In: 873.3 [P.O.:240; Blood:633.3] Out: -   General appearance: alert, cooperative, appears stated age and no distress Resp: clear to auscultation bilaterally Cardio: regular rate and rhythm, S1, S2 normal, no murmur, click, rub or gallop GI: soft, morbidly obese, non-tender; bowel sounds normal; no masses,  no organomegaly Extremities: extremities normal, atraumatic, no cyanosis or edema  Lab Results:  Recent Labs  07/27/13 0435 07/27/13 1659 07/28/13 0438  WBC 7.5 9.2 7.6  HGB 8.8* 8.1* 7.5*  HCT 25.9* 23.4* 22.1*  PLT 154 176 149*   BMET  Recent Labs  07/26/13 1512 07/27/13 0435 07/28/13 0438  NA 139 140 141  K 5.2 4.1 3.9  CL 104 103 104  CO2 26 27 25   GLUCOSE 259* 241* 246*  BUN 18 17 24*  CREATININE 1.52* 1.53* 1.76*  CALCIUM 9.2 8.7 8.7   LFT  Recent Labs  07/27/13 0435  PROT 5.6*  ALBUMIN 3.1*  AST 20  ALT 34  ALKPHOS 58  BILITOT 0.4   PT/INR  Recent Labs  07/26/13 1512  LABPROT 15.1  INR 1.19   Medications: I have reviewed the patient's current  medications.  Assessment/Plan: 1) Rectal bleeding and anemia presumed to be due to a diverticular bleed; monitor closely for now with serial CBC's.  2) HTN/DM/Hyperlipidemia.  3) Depression. 4) Morbid obesity.  LOS: 2 days   Abigaelle Verley 07/28/2013, 2:20 PM

## 2013-07-28 NOTE — Progress Notes (Signed)
Patient had one bloody stool last night and Hgb this morning 7.5. Call placed to GI MD Dr. Collene Mares and new orders received to transfuse 2 units PRBCs. First unit transfusing at this time. On call PA made aware of new orders.

## 2013-07-29 LAB — CBC
HCT: 28.2 % — ABNORMAL LOW (ref 39.0–52.0)
Hemoglobin: 9.3 g/dL — ABNORMAL LOW (ref 13.0–17.0)
MCH: 30.1 pg (ref 26.0–34.0)
MCHC: 33 g/dL (ref 30.0–36.0)
MCV: 91.3 fL (ref 78.0–100.0)
Platelets: 151 10*3/uL (ref 150–400)
RBC: 3.09 MIL/uL — ABNORMAL LOW (ref 4.22–5.81)
RDW: 14.9 % (ref 11.5–15.5)
WBC: 7.5 10*3/uL (ref 4.0–10.5)

## 2013-07-29 LAB — TYPE AND SCREEN
ABO/RH(D): A POS
Antibody Screen: NEGATIVE
Unit division: 0
Unit division: 0

## 2013-07-29 LAB — GLUCOSE, CAPILLARY
GLUCOSE-CAPILLARY: 234 mg/dL — AB (ref 70–99)
GLUCOSE-CAPILLARY: 143 mg/dL — AB (ref 70–99)
GLUCOSE-CAPILLARY: 75 mg/dL (ref 70–99)
Glucose-Capillary: 206 mg/dL — ABNORMAL HIGH (ref 70–99)

## 2013-07-29 LAB — BASIC METABOLIC PANEL
ANION GAP: 11 (ref 5–15)
BUN: 14 mg/dL (ref 6–23)
CHLORIDE: 106 meq/L (ref 96–112)
CO2: 25 meq/L (ref 19–32)
CREATININE: 1.52 mg/dL — AB (ref 0.50–1.35)
Calcium: 8.7 mg/dL (ref 8.4–10.5)
GFR calc non Af Amer: 45 mL/min — ABNORMAL LOW (ref >90)
GFR, EST AFRICAN AMERICAN: 52 mL/min — AB (ref >90)
Glucose, Bld: 208 mg/dL — ABNORMAL HIGH (ref 70–99)
POTASSIUM: 4 meq/L (ref 3.7–5.3)
Sodium: 142 mEq/L (ref 137–147)

## 2013-07-29 NOTE — Progress Notes (Signed)
[facility] PROGRESS NOTE   Paul [name] EYC:[id number] DOB: 05/19/1945 DOA: 07/26/2013 PCP: Chancy Hurter, MD  HPI/Subjective: Hemoglobin dropped yesterday to 7.5, status post transfusion of 2 units. Hemoglobin today is 9.3. Had one bowel movement the morning minimal blood.  Assessment/Plan: Principal Problem:   Rectal bleed Active Problems:   DIABETES MELLITUS, TYPE II   HYPERLIPIDEMIA   DEPRESSION   HYPERTENSION   Lower GI bleed   Acute lower GI bleed/painless rectal bleed  - Likely diverticular. Hemoglobin is stable at 10.7. Continue to monitor CBC  - Appreciate GI consult and recommendations  - Start Protonix 40 mg IV every 12 hours  - Patient has no significant complaints of pain so or there is only placed for Tylenol when necessary  - Transfuse 2 units of blood, keep in the hospital, check CBC in a.m. - Continue carbohydrate modified diet.  Acute kidney injury - Patient had creatinine as high as 1.4 in 2014 by laboratory normal range seems to be up to 1.5  - Creatinine went up to 1.76, IV fluids restarted, I will discontinue that today. - Followup BMP in the morning.  Hypertension  - Will restart blood pressure medications from home including metoprolol, Cardizem. Blood pressure is 163/67 at this time.  - hold losartan due to renal insufficiency   Diabetes, uncontrolled, renal complications  - Hemoglobin A1c is 9.1, indicating uncontrolled diabetes mellitus. - Restart home insulin regimen and sliding scale insulin in the hospital.   Hyperkalemia  -Likely secondary to potassium supplementation and losartan, both held. -Potassium is 4.1 today.  Code Status: Full code Family Communication: Plan discussed with the patient. Disposition Plan: Remains inpatient   Consultants:  GI  Procedures:  None  Antibiotics:  None   Objective: Filed Vitals:   07/29/13 0426  BP: 157/72  Pulse: 59  Temp: 98.1 F (36.7 C)  Resp: 22     Intake/Output Summary (Last 24 hours) at 07/29/13 1145 Last data filed at 07/29/13 0700  Gross per 24 hour  Intake 1982.5 ml  Output      0 ml  Net 1982.5 ml   Filed Weights   07/27/13 0656 07/28/13 0601 07/29/13 0426  Weight: 102.331 kg (225 lb 9.6 oz) 103.874 kg (229 lb) 104.509 kg (230 lb 6.4 oz)    Exam: General: Alert and awake, oriented x3, not in any acute distress. HEENT: anicteric sclera, pupils reactive to light and accommodation, EOMI CVS: S1-S2 clear, no murmur rubs or gallops Chest: clear to auscultation bilaterally, no wheezing, rales or rhonchi Abdomen: soft nontender, nondistended, normal bowel sounds, no organomegaly Extremities: no cyanosis, clubbing or edema noted bilaterally Neuro: Cranial nerves II-XII intact, no focal neurological deficits  Data Reviewed: Basic Metabolic Panel:  Recent Labs Lab 07/26/13 1005 07/26/13 1157 07/26/13 1512 07/27/13 0435 07/28/13 0438 07/29/13 0524  NA 136*  --  139 140 141 142  K 5.7* 5.5* 5.2 4.1 3.9 4.0  CL 102  --  104 103 104 106  CO2 24  --  26 27 25 25   GLUCOSE 379*  --  259* 241* 246* 208*  BUN 20  --  18 17 24* 14  CREATININE 1.48*  --  1.52* 1.53* 1.76* 1.52*  CALCIUM 9.3  --  9.2 8.7 8.7 8.7  MG  --   --  2.0  --   --   --   PHOS  --   --  3.5  --   --   --    Liver  Function Tests:  Recent Labs Lab 07/26/13 1512 07/27/13 0435  AST 21 20  ALT 39 34  ALKPHOS 62 58  BILITOT 0.3 0.4  PROT 5.5* 5.6*  ALBUMIN 2.9* 3.1*   No results found for this basename: LIPASE, AMYLASE,  in the last 168 hours No results found for this basename: AMMONIA,  in the last 168 hours CBC:  Recent Labs Lab 07/26/13 1005 07/26/13 1512 07/27/13 0435 07/27/13 1659 07/28/13 0438 07/29/13 0524  WBC 9.3 7.9 7.5 9.2 7.6 7.5  NEUTROABS 7.1 5.1  --   --   --   --   HGB 10.7* 9.3* 8.8* 8.1* 7.5* 9.3*  HCT 31.0* 26.9* 25.9* 23.4* 22.1* 28.2*  MCV 90.4 91.2 92.2 91.1 90.9 91.3  PLT 215 169 154 176 149* 151   Cardiac  Enzymes: No results found for this basename: CKTOTAL, CKMB, CKMBINDEX, TROPONINI,  in the last 168 hours BNP (last 3 results)  Recent Labs  04/30/13 1026  PROBNP 54.0   CBG:  Recent Labs Lab 07/28/13 0752 07/28/13 1155 07/28/13 1656 07/28/13 2119 07/29/13 0752  GLUCAP 250* 193* 95 146* 234*    Micro No results found for this or any previous visit (from the past 240 hour(s)).   Studies: No results found.  Scheduled Meds: . atorvastatin  20 mg Oral q1800  . cholecalciferol  2,000 Units Oral Daily  . diltiazem  360 mg Oral Daily  . hydrALAZINE  50 mg Oral BID  . insulin aspart  0-15 Units Subcutaneous TID WC  . insulin aspart  0-5 Units Subcutaneous QHS  . insulin aspart protamine- aspart  75 Units Subcutaneous BID WC  . labetalol  200 mg Oral BID  . multivitamin with minerals  1 tablet Oral Daily  . omega-3 acid ethyl esters  1 g Oral Daily  . pantoprazole  40 mg Oral BID  . PARoxetine  20 mg Oral Daily  . sodium chloride  3 mL Intravenous Q12H   Continuous Infusions: . sodium chloride 75 mL/hr at 07/28/13 1513       Time spent: 35 minutes    [facility] A  [facility] Pager [phone] If 7PM-7AM, please contact night-coverage at [email or web address], password [facility] 07/29/2013, 11:45 AM  LOS: 3 days

## 2013-07-29 NOTE — Progress Notes (Signed)
Cross cover [facility] bSubjective: Since I last evaluated the patient, he seems to be doing much better. His hemoglobin is 9.3 gm/dl this morning. He denies having any abdominal pain, nausea or vomiting. Tolerating a regular diet. I BM this morning with a streak of old blood in it.  Objective: Vital signs in last 24 hours: Temp:  [97.6 F (36.4 C)-98.6 F (37 C)] 98.1 F (36.7 C) (07/12 0426) Pulse Rate:  [58-67] 59 (07/12 0426) Resp:  [18-22] 22 (07/12 0426) BP: (136-157)/(59-72) 157/72 mmHg (07/12 0426) SpO2:  [95 %-98 %] 98 % (07/12 0426) Weight:  [104.509 kg (230 lb 6.4 oz)] 104.509 kg (230 lb 6.4 oz) (07/12 0426) Last BM Date: 07/28/13  Intake/Output from previous day: 07/11 0701 - 07/12 0700 In: 2537.1 [P.O.:720; I.V.:1183.8; Blood:633.3] Out: -  Intake/Output this shift:  General appearance: alert, cooperative, appears stated age, no distress and moderately obese Resp: clear to auscultation bilaterally Cardio: regular rate and rhythm, S1, S2 normal, no murmur, click, rub or gallop GI: soft, morbidly obese, non-tender; bowel sounds normal; no masses,  no organomegaly Extremities: extremities normal, atraumatic, no cyanosis or edema  Lab Results:  Recent Labs  07/27/13 1659 07/28/13 0438 07/29/13 0524  WBC 9.2 7.6 7.5  HGB 8.1* 7.5* 9.3*  HCT 23.4* 22.1* 28.2*  PLT 176 149* 151   BMET  Recent Labs  07/27/13 0435 07/28/13 0438 07/29/13 0524  NA 140 141 142  K 4.1 3.9 4.0  CL 103 104 106  CO2 27 25 25   GLUCOSE 241* 246* 208*  BUN 17 24* 14  CREATININE 1.53* 1.76* 1.52*  CALCIUM 8.7 8.7 8.7   LFT  Recent Labs  07/27/13 0435  PROT 5.6*  ALBUMIN 3.1*  AST 20  ALT 34  ALKPHOS 58  BILITOT 0.4   PT/INR  Recent Labs  07/26/13 1512  LABPROT 15.1  INR 1.19   Medications: I have reviewed the patient's current medications.  Assessment/Plan: 1) Anemia s/p rectal bleeding-presumed to be from diverticular disease. I think patient can go home now with  plans for close follow up with [facility]. As per my discussion wiih Dr. Hartford Poli, his discharge is anticipated in AM.   LOS: 3 days   Phelix Fudala 07/29/2013, 10:27 AM

## 2013-07-30 DIAGNOSIS — K625 Hemorrhage of anus and rectum: Secondary | ICD-10-CM

## 2013-07-30 DIAGNOSIS — R809 Proteinuria, unspecified: Secondary | ICD-10-CM

## 2013-07-30 LAB — CBC
HCT: 28.7 % — ABNORMAL LOW (ref 39.0–52.0)
HEMOGLOBIN: 9.6 g/dL — AB (ref 13.0–17.0)
MCH: 30.8 pg (ref 26.0–34.0)
MCHC: 33.4 g/dL (ref 30.0–36.0)
MCV: 92 fL (ref 78.0–100.0)
Platelets: 175 10*3/uL (ref 150–400)
RBC: 3.12 MIL/uL — AB (ref 4.22–5.81)
RDW: 14.9 % (ref 11.5–15.5)
WBC: 7.6 10*3/uL (ref 4.0–10.5)

## 2013-07-30 LAB — BASIC METABOLIC PANEL
ANION GAP: 13 (ref 5–15)
BUN: 12 mg/dL (ref 6–23)
CHLORIDE: 108 meq/L (ref 96–112)
CO2: 23 meq/L (ref 19–32)
Calcium: 9 mg/dL (ref 8.4–10.5)
Creatinine, Ser: 1.37 mg/dL — ABNORMAL HIGH (ref 0.50–1.35)
GFR calc Af Amer: 59 mL/min — ABNORMAL LOW (ref >90)
GFR calc non Af Amer: 51 mL/min — ABNORMAL LOW (ref >90)
Glucose, Bld: 106 mg/dL — ABNORMAL HIGH (ref 70–99)
POTASSIUM: 3.9 meq/L (ref 3.7–5.3)
Sodium: 144 mEq/L (ref 137–147)

## 2013-07-30 LAB — GLUCOSE, CAPILLARY
GLUCOSE-CAPILLARY: 121 mg/dL — AB (ref 70–99)
Glucose-Capillary: 63 mg/dL — ABNORMAL LOW (ref 70–99)
Glucose-Capillary: 220 mg/dL — ABNORMAL HIGH (ref 70–99)
Glucose-Capillary: 192 mg/dL — ABNORMAL HIGH (ref 70–99)

## 2013-07-30 NOTE — Discharge Summary (Signed)
Physician Discharge Summary  Paul [name] XVQ:[id number] DOB: [birth year] DOA: 07/26/2013  PCP: Chancy Hurter, MD  Admit date: 07/26/2013 Discharge date: 07/30/2013  Time spent: 40 minutes  Recommendations for Outpatient Follow-up:  1. Followup with primary care physician within one week.  Discharge Diagnoses:  Principal Problem:   Rectal bleed Active Problems:   DIABETES MELLITUS, TYPE II   HYPERLIPIDEMIA   DEPRESSION   HYPERTENSION   Lower GI bleed   Discharge Condition: Stable  Diet recommendation: Carbohydrate modified diet and heart healthy diet  Filed Weights   07/28/13 0601 07/29/13 0426 07/30/13 0439  Weight: 103.874 kg (229 lb) 104.509 kg (230 lb 6.4 oz) 106.595 kg (235 lb)    History of present illness:  68 year old male with past medical history of hypertension, dyslipidemia, diabetes, depression who presented to [facility] ED 07/26/2013 with sudden onset rectal bleed. Patient reported he went to the bathroom at 2 AM at which time he had a bowel movement and he noticed bleeding per rectum which was quite significant. It has never happened before. He reports he does not take aspirin. He did take ibuprofen one week prior to this admission one time but not in the last few days. Patient reports painless rectal bleed, stool is soft, no diarrhea or constipation. No reports of fevers or chills. He did have associated left lower quadrant pain earlier today but this has now resolved. Patient was sharp, about 5-6/10 in intensity, nonradiating. He said he was eating nuts last evening which may have provoked the pain. No reports of chest pain, shortness of breath or palpitations. No GU complaints. No lightheadedness or loss of consciousness.  In ED, blood pressure was 148/82, heart rate 60, T max 98.5 F, oxygen saturation 97% on room air. CT abdomen showed possible left colon diverticulosis but not diverticulitis. Blood work revealed hemoglobin of 10.7, potassium 5.7 and creatinine 1.48. He  was admitted for further evaluation. In ED has consulted GI as well.  Hospital Course:   Acute lower GI bleed/painless rectal bleed  -Likely diverticular. Hemoglobin is stable at 10.7. Continue to monitor CBC  -Appreciate GI consult and recommendations  -Start Protonix 40 mg IV every 12 hours  -Had bloody bowel movements for about 2 days, but bowel movements there is cleared now without blood. -Presumed diverticular bleeding, required transfusion of 2 units of packed RBCs, appears to be resolved  Acute kidney injury  -Patient had creatinine as high as 1.4 in 2014 by laboratory normal range seems to be up to 1.5  -Creatinine went up to 1.76, IV fluids restarted, I will discontinue that today.  -Creatinine is back to 1.3, restart home medication on discharge  Hypertension  -Will restart blood pressure medications from home including metoprolol, Cardizem. Blood pressure is 163/67 at this time.  -Hold losartan due to renal insufficiency, restarted on discharge.   Diabetes, uncontrolled, renal complications  - Hemoglobin A1c is 9.1, indicating uncontrolled diabetes mellitus.  - Restart home insulin regimen and sliding scale insulin in the hospital. -His diabetes is uncontrolled, blood sugar is up and down, please consider to switch to Lantus or Levemir if indicated.   Hyperkalemia  -Likely secondary to potassium supplementation and losartan, both held.  -Potassium is normal and the day of discharge.  Procedures:  None  Consultations:  Gastroenterology  Discharge Exam: Filed Vitals:   07/30/13 0439  BP: 160/68  Pulse: 66  Temp: 98 F (36.7 C)  Resp: 20   General: Alert and awake, oriented x3, not in any  acute distress. HEENT: anicteric sclera, pupils reactive to light and accommodation, EOMI CVS: S1-S2 clear, no murmur rubs or gallops Chest: clear to auscultation bilaterally, no wheezing, rales or rhonchi Abdomen: soft nontender, nondistended, normal bowel sounds, no  organomegaly Extremities: no cyanosis, clubbing or edema noted bilaterally Neuro: Cranial nerves II-XII intact, no focal neurological deficits  Discharge Instructions You were cared for by a hospitalist during your hospital stay. If you have any questions about your discharge medications or the care you received while you were in the hospital after you are discharged, you can call the unit and asked to speak with the hospitalist on call if the hospitalist that took care of you is not available. Once you are discharged, your primary care physician will handle any further medical issues. Please note that NO REFILLS for any discharge medications will be authorized once you are discharged, as it is imperative that you return to your primary care physician (or establish a relationship with a primary care physician if you do not have one) for your aftercare needs so that they can reassess your need for medications and monitor your lab values.  Discharge Instructions   Diet Carb Modified    Complete by:  As directed      Increase activity slowly    Complete by:  As directed             Medication List         atorvastatin 20 MG tablet  Commonly known as:  LIPITOR  Take 20 mg by mouth daily at 6 PM.     bumetanide 2 MG tablet  Commonly known as:  BUMEX  Take 2 mg by mouth daily.     CENTURY SENIOR Tabs  Take 1 tablet by mouth daily.     diltiazem 360 MG 24 hr capsule  Commonly known as:  CARDIZEM CD  Take 360 mg by mouth daily with breakfast.     Fish Oil 1200 MG Caps  Take 1,200 mg by mouth daily.     hydrALAZINE 50 MG tablet  Commonly known as:  APRESOLINE  Take 50 mg by mouth 2 (two) times daily.     insulin aspart protamine- aspart (70-30) 100 UNIT/ML injection  Commonly known as:  NOVOLOG MIX 70/30  Inject 60-80 Units into the skin 2 (two) times daily with a meal. On sliding scale     labetalol 200 MG tablet  Commonly known as:  NORMODYNE  Take 200 mg by mouth 2 (two)  times daily.     losartan 100 MG tablet  Commonly known as:  COZAAR  Take 100 mg by mouth daily with breakfast.     PARoxetine 20 MG tablet  Commonly known as:  PAXIL  Take 20 mg by mouth daily.     potassium chloride SA 20 MEQ tablet  Commonly known as:  K-DUR,KLOR-CON  Take 20 mEq by mouth every evening.     Vitamin D-3 1000 UNITS Caps  Take 2,000 Units by mouth daily.       Allergies  Allergen Reactions  . Amlodipine Swelling       Follow-up Information   Follow up with Nicoletta Ba, PA-C On 08/10/2013. (at 10 am    we will repeat labs and get you scheduled for colonoscopy)    Specialty:  Gastroenterology   Contact information:   [place]  [id number] [phone]        The results of significant diagnostics from this hospitalization (including  imaging, microbiology, ancillary and laboratory) are listed below for reference.    Significant Diagnostic Studies: Ct Abdomen Pelvis W Contrast  07/26/2013   CLINICAL DATA:  Rectal bleeding. Left lower quadrant pain. History of melanoma.  EXAM: CT ABDOMEN AND PELVIS WITH CONTRAST  TECHNIQUE: Multidetector CT imaging of the abdomen and pelvis was performed using the standard protocol following bolus administration of intravenous contrast.  CONTRAST:  149mL OMNIPAQUE IOHEXOL 300 MG/ML  SOLN  COMPARISON:  None available  FINDINGS: Lung bases are clear.  No pericardial fluid.  Ill-defined low-density lesion in the right hepatic lobe and (segment 7/8) measuring 12 mm on image 16, series 2. Gallbladder, pancreas, spleen, the spleen are normal. The adrenal glands are nodular likely representing hyperplasia. There is a low-density rounded lesion within the cortex of the right kidney with simple fluid attenuation likely representing a benign cyst. Other smaller cortical hypodensities are too small to characterize.  The stomach, small bowel, appendix, cecum are normal. The colon demonstrates diverticula through the sigmoid  region without acute inflammation. No evidence of mass or obstruction.  Abdominal aorta is normal caliber. No retroperitoneal periportal lymphadenopathy  No free fluid the pelvis. The prostate gland and bladder normal. No pelvic lymphadenopathy. No aggressive osseous lesion.  IMPRESSION: 1. No abnormality of small bowel or large bowel to explain the melanoma other than left colon diverticulosis. No evidence of diverticulitis. 2. Small hypodense lesion within the liver is indeterminate. No comparison CTs are available. Depending on level of concern for malignancy, a contrast MRI of the abdomen could be performed. 3. Low-density lesions in the kidney are likely benign cysts. 4. Probable adrenal hyperplasia.   Electronically Signed   By: Suzy Bouchard M.D.   On: 07/26/2013 11:38    Microbiology: No results found for this or any previous visit (from the past 240 hour(s)).   Labs: Basic Metabolic Panel:  Recent Labs Lab 07/26/13 1005  07/26/13 1512 07/27/13 0435 07/28/13 0438 07/29/13 0524 07/30/13 0431  NA 136*  --  139 140 141 142 144  K 5.7*  < > 5.2 4.1 3.9 4.0 3.9  CL 102  --  104 103 104 106 108  CO2 24  --  26 27 25 25 23   GLUCOSE 379*  --  259* 241* 246* 208* 106*  BUN 20  --  18 17 24* 14 12  CREATININE 1.48*  --  1.52* 1.53* 1.76* 1.52* 1.37*  CALCIUM 9.3  --  9.2 8.7 8.7 8.7 9.0  MG  --   --  2.0  --   --   --   --   PHOS  --   --  3.5  --   --   --   --   < > = values in this interval not displayed. Liver Function Tests:  Recent Labs Lab 07/26/13 1512 07/27/13 0435  AST 21 20  ALT 39 34  ALKPHOS 62 58  BILITOT 0.3 0.4  PROT 5.5* 5.6*  ALBUMIN 2.9* 3.1*   No results found for this basename: LIPASE, AMYLASE,  in the last 168 hours No results found for this basename: AMMONIA,  in the last 168 hours CBC:  Recent Labs Lab 07/26/13 1005 07/26/13 1512 07/27/13 0435 07/27/13 1659 07/28/13 0438 07/29/13 0524 07/30/13 0431  WBC 9.3 7.9 7.5 9.2 7.6 7.5 7.6   NEUTROABS 7.1 5.1  --   --   --   --   --   HGB 10.7* 9.3* 8.8* 8.1* 7.5* 9.3* 9.6*  HCT 31.0* 26.9* 25.9* 23.4* 22.1* 28.2* 28.7*  MCV 90.4 91.2 92.2 91.1 90.9 91.3 92.0  PLT 215 169 154 176 149* 151 175   Cardiac Enzymes: No results found for this basename: CKTOTAL, CKMB, CKMBINDEX, TROPONINI,  in the last 168 hours BNP: BNP (last 3 results)  Recent Labs  04/30/13 1026  PROBNP 54.0   CBG:  Recent Labs Lab 07/29/13 1729 07/29/13 2118 07/30/13 0356 07/30/13 0435 07/30/13 0746  GLUCAP 143* 75 63* 121* 220*       Signed:  Cassi Jenne A  [facility] 07/30/2013, 10:58 AM

## 2013-08-10 ENCOUNTER — Encounter: Payer: Self-pay | Admitting: *Deleted

## 2013-08-10 ENCOUNTER — Ambulatory Visit (INDEPENDENT_AMBULATORY_CARE_PROVIDER_SITE_OTHER): Payer: Medicare Other | Admitting: Physician Assistant

## 2013-08-10 ENCOUNTER — Other Ambulatory Visit (INDEPENDENT_AMBULATORY_CARE_PROVIDER_SITE_OTHER): Payer: Medicare Other

## 2013-08-10 VITALS — BP 142/72 | HR 60 | Ht 64.0 in | Wt 227.0 lb

## 2013-08-10 DIAGNOSIS — K625 Hemorrhage of anus and rectum: Secondary | ICD-10-CM

## 2013-08-10 DIAGNOSIS — K5791 Diverticulosis of intestine, part unspecified, without perforation or abscess with bleeding: Secondary | ICD-10-CM

## 2013-08-10 DIAGNOSIS — K5731 Diverticulosis of large intestine without perforation or abscess with bleeding: Secondary | ICD-10-CM

## 2013-08-10 DIAGNOSIS — Z860101 Personal history of adenomatous and serrated colon polyps: Secondary | ICD-10-CM

## 2013-08-10 DIAGNOSIS — Z860100 Personal history of colon polyps, unspecified: Secondary | ICD-10-CM

## 2013-08-10 LAB — CBC WITH DIFFERENTIAL/PLATELET
Basophils Absolute: 0 10*3/uL (ref 0.0–0.1)
Basophils Relative: 0.6 % (ref 0.0–3.0)
EOS ABS: 0.2 10*3/uL (ref 0.0–0.7)
EOS PCT: 3.1 % (ref 0.0–5.0)
HCT: 35.8 % — ABNORMAL LOW (ref 39.0–52.0)
Hemoglobin: 12.1 g/dL — ABNORMAL LOW (ref 13.0–17.0)
Lymphocytes Relative: 24.6 % (ref 12.0–46.0)
Lymphs Abs: 1.6 10*3/uL (ref 0.7–4.0)
MCHC: 33.7 g/dL (ref 30.0–36.0)
MCV: 91.6 fl (ref 78.0–100.0)
MONO ABS: 0.6 10*3/uL (ref 0.1–1.0)
Monocytes Relative: 8.9 % (ref 3.0–12.0)
NEUTROS PCT: 62.8 % (ref 43.0–77.0)
Neutro Abs: 4.2 10*3/uL (ref 1.4–7.7)
PLATELETS: 230 10*3/uL (ref 150.0–400.0)
RBC: 3.91 Mil/uL — ABNORMAL LOW (ref 4.22–5.81)
RDW: 14.9 % (ref 11.5–15.5)
WBC: 6.7 10*3/uL (ref 4.0–10.5)

## 2013-08-10 MED ORDER — NA SULFATE-K SULFATE-MG SULF 17.5-3.13-1.6 GM/177ML PO SOLN: 1.0000 | Freq: Once | ORAL | Status: AC

## 2013-08-10 NOTE — Progress Notes (Signed)
Subjective:    Patient ID: Paul [name], male    DOB: [birth year], 68 y.o.   MRN: [id number]  HPI  Paul [name] is a very nice 68 year old white male known previously to Dr. Deatra Ina. He has history of tubular adenomatous polyps with last colonoscopy done in October of 2011 he was found to have several small left colon polyps all of which were tubular adenomas. Also with moderate diverticulosis of the left colon. Patient was hospitalized on 07/27/2013 with abrupt onset of bright red blood per rectum He was seen in consultation at the hospital. He had had several episodes of bright red blood prior to arriving to the hospital. He had some mild abdominal cramping which subsided quickly. After hospitalization he had one further bloody bowel movement and then his bleeding gradually resolved. Is not on any blood thinners no regular aspirin or NSAIDs. Hemoglobin was 10.7 on arrival to the emergency room and dropped to the 8 range. He was transfused and was allowed discharged home on 07/30/2013 with hemoglobin of 9.6. It was felt clinically that he had had a diverticular hemorrhage, he did not have colonoscopy while hospitalized and we plan to followup as an outpatient. Other medical problems include adult-onset diabetes mellitus, hyperlipidemia, hypertension, obesity, and history of a melanoma. He comes in today for followup stating that he's been doing well since discharge from the hospital. He has no complaints of melena or hematochezia and says his bowels are moving well. He has occasionally had a twinge of left sided abdominal discomfort. No fever chills etc. 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    Review of Systems  Constitutional: Negative.   HENT: Negative.   Eyes: Negative.   Respiratory: Negative.   Gastrointestinal: Positive for abdominal pain.  Endocrine: Negative.   Genitourinary:  Negative.   Musculoskeletal: Negative.   Skin: Negative.   Allergic/Immunologic: Negative.   Neurological: Negative.   Hematological: Negative.   Psychiatric/Behavioral: Negative.    Outpatient Prescriptions Prior to Visit  Medication Sig Dispense Refill  . atorvastatin (LIPITOR) 20 MG tablet Take 20 mg by mouth daily at 6 PM.      . bumetanide (BUMEX) 2 MG tablet Take 2 mg by mouth daily.      . Cholecalciferol (VITAMIN D-3) 1000 UNITS CAPS Take 2,000 Units by mouth daily.      Marland Kitchen diltiazem (CARDIZEM CD) 360 MG 24 hr capsule Take 360 mg by mouth daily with breakfast.      . hydrALAZINE (APRESOLINE) 50 MG tablet Take 50 mg by mouth 2 (two) times daily.      . insulin aspart protamine- aspart (NOVOLOG MIX 70/30) (70-30) 100 UNIT/ML injection Inject 60-80 Units into the skin 2 (two) times daily with a meal. On sliding scale      . labetalol (NORMODYNE) 200 MG tablet Take 200 mg by mouth 2 (two) times daily.      Marland Kitchen losartan (COZAAR) 100 MG tablet Take 100 mg by mouth daily with breakfast.      . Multiple Vitamins-Minerals (CENTURY SENIOR) TABS Take 1 tablet by mouth daily.      . Omega-3 Fatty Acids (FISH OIL) 1200 MG CAPS Take 1,200 mg by mouth daily.      Marland Kitchen PARoxetine (PAXIL) 20 MG tablet Take 20 mg by mouth daily.      . potassium chloride SA (K-DUR,KLOR-CON)  20 MEQ tablet Take 20 mEq by mouth every evening.       No facility-administered medications prior to visit.   Allergies  Allergen Reactions  . Amlodipine Swelling   Patient Active Problem List   Diagnosis Date Noted  . Diverticulosis of colon with hemorrhage 08/10/2013  . Hx of adenomatous colonic polyps 08/10/2013  . Rectal bleed 07/26/2013  . Melanoma of skin, site unspecified 12/18/2012  . RETROGRADE EJACULATION 09/04/2009  . OBESITY 12/28/2007  . GOUT 06/28/2007  . ANEMIA 12/27/2006  . DIABETES MELLITUS, TYPE II 07/28/2006  . HYPERLIPIDEMIA 07/28/2006  . DEPRESSION 07/28/2006  . HYPERTENSION 07/28/2006  .  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 Smoking status: Former Smoker    Types: Cigars, Cigarettes    Quit date: 09/13/1998  . Smokeless tobacco: Never Used  . Alcohol Use: No   family history includes Cancer in his mother; Cerebral aneurysm in his father; Heart attack in his mother; Hypertension in his father.     Objective:   Physical Exam  well-developed older white male in no acute distress, pleasant blood pressure 142 or 72 pulse 60 height 5 foot 4 weight 227, BMI 38.9. HEENT ;nontraumatic normocephalic EOMI PERRLA sclera anicteric, Supple; no JVD, Cardiovascular; regular rate and rhythm with S1-S2 no murmur or gallop, Pulm; clear bilaterally, Abdomen; obese soft nontender nondistended bowel sounds are active there is no palpable mass or hepatosplenomegaly bowel sounds are present, Rectal ;exam not done, Extremities; no clubbing cyanosis or edema skin warm and dry, Psych; mood and affect appropriate         Assessment #13 & Plan:  #61   68 year old white male with recent hospitalization for an acute lower GI bleed, presumed diverticular. Doing well with no evidence of further bleeding he has had an occasional point of left-sided abdominal discomfort. #2 normocytic anemia secondary to acute blood loss #3 history of tubular adenomatous colon polyps last colonoscopy 2011 #4 obesity #5 hypertension #6 diabetes mellitus #7 hyperlipidemia #8 history of melanoma  Plan; check CBC today Schedule for  colonoscopy with Dr. Deatra Ina. Procedure discussed in detail with patient and he is agreeable to proceed. 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

## 2013-08-10 NOTE — Patient Instructions (Signed)
Please go to the basement level to have your labs drawn.  You have been scheduled for a colonoscopy. Please follow written instructions given to you at your visit today.  We have given you a sample prep for the colonoscopy. If you use inhalers (even only as needed), please bring them with you on the day of your procedure. Your physician has requested that you go to www.startemmi.com and enter the access code given to you at your visit today. This web site gives a general overview about your procedure. However, you should still follow specific instructions given to you by our office regarding your preparation for the procedure.

## 2013-08-13 NOTE — Progress Notes (Signed)
Reviewed and agree with management. Alison Breeding D. Evans Levee, M.D., FACG  

## 2013-08-16 ENCOUNTER — Encounter: Payer: Self-pay | Admitting: Gastroenterology

## 2013-08-17 ENCOUNTER — Other Ambulatory Visit: Payer: Self-pay | Admitting: Internal Medicine

## 2013-08-28 ENCOUNTER — Encounter: Payer: Self-pay | Admitting: Physician Assistant

## 2013-08-28 ENCOUNTER — Ambulatory Visit (INDEPENDENT_AMBULATORY_CARE_PROVIDER_SITE_OTHER): Payer: Medicare Other | Admitting: Physician Assistant

## 2013-08-28 VITALS — BP 144/80 | HR 66 | Temp 98.2°F | Resp 18 | Wt 228.0 lb

## 2013-08-28 DIAGNOSIS — I10 Essential (primary) hypertension: Secondary | ICD-10-CM

## 2013-08-28 NOTE — Progress Notes (Signed)
Subjective:    Patient ID: Paul [name], male    DOB: [birth year], 68 y.o.   MRN: [id number]  HPI Patient is a 68 y.o. male presenting for follow up HTN. Pt was taken off of Ramapril about 1 month ago as he was also taking losartan. At that time his BP was 148/82. He was told to f/u in month to reassess, as well as to bring in a new home BP cuff to check against office BP cuff. He has been consistently getting BP readings of around 160/80 at home with new BP cuff. He is wondering if maybe the cuff is too small. Patient denies fevers, chills, nausea, vomiting, diarrhea, shortness of breath, chest pain, headache, syncope.    Review of Systems As per HPI and are otherwise negative.  Past Medical History  Diagnosis Date  . Diabetes mellitus     type 2  . Depression   . Hypertension   . Hyperlipidemia   . Microalbuminuria   . melanoma dx'd 12/2012    rt arm; surg   . Colon polyps 12/09/2009    Multiple in descending colon  . Diverticulosis 12/09/2009    Moderate    History   Social History  . Marital Status: Married    Spouse Name: N/A    Number of Children: 4  . Years of Education: N/A   Occupational History  . 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 Smoking status: Former Smoker    Types: Cigars, Cigarettes    Quit date: 09/13/1998  . Smokeless tobacco: Never Used  . Alcohol Use: No  . Drug Use: No  . Sexual Activity: No   Other Topics Concern  . Not on file   Social History Narrative  . No narrative on file    Past Surgical History  Procedure Laterality Date  . Tonsillectomy and adenoidectomy    . Mass excision  09/29/2011    Procedure: MINOR EXCISION OF MASS;  Surgeon: Pedro Earls, MD;  Location: [place];  Service: General;  Laterality: Left;  Excision sebaceous cyst on neck  . Melanoma excision  12/2012    Family History  Problem Relation Age of Onset  . Cancer Mother     breast  . Heart attack Mother     . Hypertension Father   . Cerebral aneurysm Father     Allergies  Allergen Reactions  . Amlodipine Swelling    Current Outpatient Prescriptions on File Prior to Visit  Medication Sig Dispense Refill  . atorvastatin (LIPITOR) 20 MG tablet Take 20 mg by mouth daily at 6 PM.      . bumetanide (BUMEX) 2 MG tablet Take 2 mg by mouth daily.      . Cholecalciferol (VITAMIN D-3) 1000 UNITS CAPS Take 2,000 Units by mouth daily.      Marland Kitchen diltiazem (CARDIZEM CD) 360 MG 24 hr capsule Take 360 mg by mouth daily with breakfast.      . hydrALAZINE (APRESOLINE) 50 MG tablet Take 50 mg by mouth 2 (two) times daily.      . insulin aspart protamine- aspart (NOVOLOG MIX 70/30) (70-30) 100 UNIT/ML injection Inject 60-80 Units into the skin 2 (two) times daily with a meal. On sliding scale      . labetalol (NORMODYNE) 200 MG tablet Take 200 mg by mouth 2 (two) times daily.      Marland Kitchen losartan (COZAAR) 100 MG tablet Take 100 mg by mouth daily with  breakfast.      . Multiple Vitamins-Minerals (CENTURY SENIOR) TABS Take 1 tablet by mouth daily.      . Na Sulfate-K Sulfate-Mg Sulf SOLN Take 1 kit by mouth once.  354 mL  0  . Omega-3 Fatty Acids (FISH OIL) 1200 MG CAPS Take 1,200 mg by mouth daily.      Marland Kitchen PARoxetine (PAXIL) 20 MG tablet Take 20 mg by mouth daily.      . potassium chloride SA (K-DUR,KLOR-CON) 20 MEQ tablet Take 20 mEq by mouth every evening.       No current facility-administered medications on file prior to visit.    EXAM: BP 144/80  Pulse 66  Temp(Src) 98.2 F (36.8 C) (Oral)  Resp 18  Wt 228 lb (103.42 kg)      Objective:   Physical Exam  Nursing note and vitals reviewed. Constitutional: He is oriented to person, place, and time. He appears well-developed and well-nourished. No distress.  HENT:  Head: Normocephalic and atraumatic.  Eyes: Conjunctivae and EOM are normal. Pupils are equal, round, and reactive to light.  Cardiovascular: Normal rate, regular rhythm and intact distal  pulses.   Pulmonary/Chest: Effort normal and breath sounds normal. No respiratory distress. He exhibits no tenderness.  Musculoskeletal: He exhibits edema (1+ pitting edema.).  Neurological: He is alert and oriented to person, place, and time.  Skin: Skin is warm and dry. No rash noted. He is not diaphoretic. No erythema. No pallor.  Psychiatric: He has a normal mood and affect. His behavior is normal. Judgment and thought content normal.     Lab Results  Component Value Date   WBC 6.7 08/10/2013   HGB 12.1* 08/10/2013   HCT 35.8* 08/10/2013   PLT 230.0 08/10/2013   GLUCOSE 106* 07/30/2013   CHOL 157 04/30/2013   TRIG 157.0* 04/30/2013   HDL 38.80* 04/30/2013   LDLCALC 87 04/30/2013   ALT 34 07/27/2013   AST 20 07/27/2013   NA 144 07/30/2013   K 3.9 07/30/2013   CL 108 07/30/2013   CREATININE 1.37* 07/30/2013   BUN 12 07/30/2013   CO2 23 07/30/2013   INR 1.19 07/26/2013   HGBA1C 9.1* 07/26/2013   MICROALBUR 41.9* 04/30/2013        Assessment & Plan:  Naaman was seen today for follow-up.  Diagnoses and associated orders for this visit:  Unspecified essential hypertension Comments: Continue current therapy, increase leg raises, try larger cuff for home readings.     Return precautions provided, and patient handout on HTN.  Plan to follow up in about 1 month to reassess with Dr. Yong Channel, or for worsening or persistent symptoms despite treatment.  Patient Instructions  Continue current regimen.  Check into getting a larger blood pressure cuff.  Schedule an appointment prior to leaving today to establish with Dr. Yong Channel.  Continue to monitor your home blood pressures.  If emergency symptoms discussed during visit developed, seek medical attention immediately.  Followup in about 1 month with Dr. Yong Channel, or for worsening or persistent symptoms despite treatment.

## 2013-08-28 NOTE — Patient Instructions (Addendum)
Continue current regimen.  Check into getting a larger blood pressure cuff.  Schedule an appointment prior to leaving today to establish with Dr. Yong Channel.  Continue to monitor your home blood pressures.  If emergency symptoms discussed during visit developed, seek medical attention immediately.  Followup in about 1 month with Dr. Yong Channel, or for worsening or persistent symptoms despite treatment.   Hypertension Hypertension is another name for high blood pressure. High blood pressure forces your heart to work harder to pump blood. A blood pressure reading has two numbers, which includes a higher number over a lower number (example: 110/72). HOME CARE   Have your blood pressure rechecked by your doctor.  Only take medicine as told by your doctor. Follow the directions carefully. The medicine does not work as well if you skip doses. Skipping doses also puts you at risk for problems.  Do not smoke.  Monitor your blood pressure at home as told by your doctor. GET HELP IF:  You think you are having a reaction to the medicine you are taking.  You have repeat headaches or feel dizzy.  You have puffiness (swelling) in your ankles.  You have trouble with your vision. GET HELP RIGHT AWAY IF:   You get a very bad headache and are confused.  You feel weak, numb, or faint.  You get chest or belly (abdominal) pain.  You throw up (vomit).  You cannot breathe very well. MAKE SURE YOU:   Understand these instructions.  Will watch your condition.  Will get help right away if you are not doing well or get worse. Document Released: 06/23/2007 Document Revised: 01/09/2013 Document Reviewed: 10/27/2012 [facility] Patient Information 2015 [place], [place]. This information is not intended to replace advice given to you by your health care provider. Make sure you discuss any questions you have with your health care provider.

## 2013-09-12 ENCOUNTER — Telehealth: Payer: Self-pay | Admitting: Endocrinology

## 2013-09-12 ENCOUNTER — Other Ambulatory Visit: Payer: Self-pay

## 2013-09-12 MED ORDER — INSULIN ASPART PROT & ASPART (70-30 MIX) 100 UNIT/ML PEN: PEN_INJECTOR | SUBCUTANEOUS | Status: DC

## 2013-09-12 NOTE — Telephone Encounter (Signed)
Rx sent per pt's request. Pt advised that rx has been sent.

## 2013-09-12 NOTE — Telephone Encounter (Signed)
Pt has rx for novolog flex pen the rx says 50 u but he is taking 60 and 70 u please update the dosage on the rx [facility] # [phone] p # [phone]

## 2013-09-14 ENCOUNTER — Telehealth: Payer: Self-pay

## 2013-09-14 NOTE — Telephone Encounter (Signed)
Diabetic Bundle lvom to call back and schedule.

## 2013-09-21 ENCOUNTER — Encounter: Payer: Self-pay | Admitting: Endocrinology

## 2013-09-21 ENCOUNTER — Ambulatory Visit (INDEPENDENT_AMBULATORY_CARE_PROVIDER_SITE_OTHER): Payer: Medicare Other | Admitting: Endocrinology

## 2013-09-21 VITALS — BP 120/82 | HR 82 | Temp 97.8°F | Ht 64.0 in | Wt 232.0 lb

## 2013-09-21 DIAGNOSIS — E119 Type 2 diabetes mellitus without complications: Secondary | ICD-10-CM

## 2013-09-21 NOTE — Patient Instructions (Addendum)
check your blood sugar twice a day.  vary the time of day when you check, between before the 3 meals, and at bedtime.  also check if you have symptoms of your blood sugar being too high or too low.  please keep a record of the readings and bring it to your next appointment here.  please call us sooner if your blood sugar goes below 70, or if you have a lot of readings over 200.   Please increase the insulin to 75 units with breakfast, and 75 units with the evening meal.  However, if you are going to be active, take just 50 units with breakfast that day.    On this type of insulin schedule, you should eat meals on a regular schedule.  If a meal is missed or significantly delayed, your blood sugar could go low. Please come back for a follow-up appointment in 1 month.

## 2013-09-21 NOTE — Progress Notes (Signed)
Subjective:    Patient ID: Paul [name], male    DOB: 11-08-1945, 68 y.o.   MRN: [id number]  HPI pt returns for f/u of insulin-requiring DM (dx'ed 2004, on a routine blood test; complicated by nephropathy; he has been on insulin since 2009; he takes bid premixed insulin, because he says he can't remember the lunch insulin; he has never had pancreatitis, severe hypoglycemia or DKA).  He takes a varying insulin dosage (according meals, and anticipated activity).  He seldom checks cbg's.  He seldom has hypoglycemia (this happens with a missed meal). Past Medical History  Diagnosis Date  . Diabetes mellitus     type 2  . Depression   . Hypertension   . Hyperlipidemia   . Microalbuminuria   . melanoma dx'd 12/2012    rt arm; surg   . Colon polyps 12/09/2009    Multiple in descending colon  . Diverticulosis 12/09/2009    Moderate    Past Surgical History  Procedure Laterality Date  . Tonsillectomy and adenoidectomy    . Mass excision  09/29/2011    Procedure: MINOR EXCISION OF MASS;  Surgeon: Pedro Earls, MD;  Location: [facility];  Service: General;  Laterality: Left;  Excision sebaceous cyst on neck  . Melanoma excision  12/2012    History   Social History  . Marital Status: Married    Spouse Name: N/A    Number of Children: 4  . Years of Education: N/A   Occupational History  . 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 Smoking status: Former Smoker    Types: Cigars, Cigarettes    Quit date: 09/13/1998  . Smokeless tobacco: Never Used  . Alcohol Use: No  . Drug Use: No  . Sexual Activity: No   Other Topics Concern  . Not on file   Social History Narrative  . No narrative on file    Current Outpatient Prescriptions on File Prior to Visit  Medication Sig Dispense Refill  . atorvastatin (LIPITOR) 20 MG tablet Take 20 mg by mouth daily at 6 PM.      . bumetanide (BUMEX) 2 MG tablet Take 2 mg by mouth daily.      .  Cholecalciferol (VITAMIN D-3) 1000 UNITS CAPS Take 2,000 Units by mouth daily.      Marland Kitchen diltiazem (CARDIZEM CD) 360 MG 24 hr capsule Take 360 mg by mouth daily with breakfast.      . hydrALAZINE (APRESOLINE) 50 MG tablet Take 50 mg by mouth 2 (two) times daily.      Marland Kitchen labetalol (NORMODYNE) 200 MG tablet Take 200 mg by mouth 2 (two) times daily.      Marland Kitchen losartan (COZAAR) 100 MG tablet Take 100 mg by mouth daily with breakfast.      . Multiple Vitamins-Minerals (CENTURY SENIOR) TABS Take 1 tablet by mouth daily.      . Omega-3 Fatty Acids (FISH OIL) 1200 MG CAPS Take 1,200 mg by mouth daily.      Marland Kitchen PARoxetine (PAXIL) 20 MG tablet Take 20 mg by mouth daily.      . potassium chloride SA (K-DUR,KLOR-CON) 20 MEQ tablet Take 20 mEq by mouth every evening.       No current facility-administered medications on file prior to visit.    Allergies  Allergen Reactions  . Amlodipine Swelling    Family History  Problem Relation Age of Onset  . Cancer Mother     breast  .  Heart attack Mother   . Hypertension Father   . Cerebral aneurysm Father     BP 120/82  Pulse 82  Temp(Src) 97.8 F (36.6 C) (Oral)  Ht 5\' 4"  (1.626 m)  Wt 232 lb (105.235 kg)  BMI 39.80 kg/m2   Review of Systems He has weight gain.  Denies LOC.    Objective:   Physical Exam VITAL SIGNS:  See vs page GENERAL: no distress Pulses: dorsalis pedis intact bilat.   Feet: no deformity. normal color and temp.  2+ left leg edema, and 1+ on the right leg. Skin:  no ulcer on the feet.   Neuro: sensation is intact to touch on the feet   Lab Results  Component Value Date   HGBA1C 9.1* 07/26/2013   i reviewed electrocardiogram (july, 2015)  i have reviewed the following outside records: Office notes     Assessment & Plan:  DM: moderate exacerbation. Noncompliance with cbg recording and f/u ov's: I'll work around this as best I can.   Patient is advised the following: Patient Instructions  check your blood sugar twice a  day.  vary the time of day when you check, between before the 3 meals, and at bedtime.  also check if you have symptoms of your blood sugar being too high or too low.  please keep a record of the readings and bring it to your next appointment here.  please call us sooner if your blood sugar goes below 70, or if you have a lot of readings over 200.   Please increase the insulin to 75 units with breakfast, and 75 units with the evening meal.  However, if you are going to be active, take just 50 units with breakfast that day.    On this type of insulin schedule, you should eat meals on a regular schedule.  If a meal is missed or significantly delayed, your blood sugar could go low. Please come back for a follow-up appointment in 1 month.

## 2013-09-26 ENCOUNTER — Other Ambulatory Visit: Payer: Self-pay

## 2013-09-26 ENCOUNTER — Ambulatory Visit (AMBULATORY_SURGERY_CENTER): Payer: Medicare Other | Admitting: Gastroenterology

## 2013-09-26 ENCOUNTER — Encounter: Payer: Self-pay | Admitting: Gastroenterology

## 2013-09-26 VITALS — BP 157/71 | HR 56 | Temp 97.8°F | Resp 14 | Ht 64.0 in | Wt 232.0 lb

## 2013-09-26 DIAGNOSIS — Z86010 Personal history of colonic polyps: Secondary | ICD-10-CM | POA: Diagnosis not present

## 2013-09-26 DIAGNOSIS — D126 Benign neoplasm of colon, unspecified: Secondary | ICD-10-CM | POA: Diagnosis not present

## 2013-09-26 DIAGNOSIS — D124 Benign neoplasm of descending colon: Secondary | ICD-10-CM

## 2013-09-26 DIAGNOSIS — K5730 Diverticulosis of large intestine without perforation or abscess without bleeding: Secondary | ICD-10-CM

## 2013-09-26 LAB — GLUCOSE, CAPILLARY
GLUCOSE-CAPILLARY: 190 mg/dL — AB (ref 70–99)
Glucose-Capillary: 158 mg/dL — ABNORMAL HIGH (ref 70–99)

## 2013-09-26 MED ORDER — SODIUM CHLORIDE 0.9 % IV SOLN: 500.0000 mL | INTRAVENOUS | Status: DC

## 2013-09-26 NOTE — Patient Instructions (Signed)
YOU HAD AN ENDOSCOPIC PROCEDURE TODAY AT THE [facility]: Refer to the procedure report that was given to you for any specific questions about what was found during the examination.  If the procedure report does not answer your questions, please call your gastroenterologist to clarify.  If you requested that your care partner not be given the details of your procedure findings, then the procedure report has been included in a sealed envelope for you to review at your convenience later.  YOU SHOULD EXPECT: Some feelings of bloating in the abdomen. Passage of more gas than usual.  Walking can help get rid of the air that was put into your GI tract during the procedure and reduce the bloating. If you had a lower endoscopy (such as a colonoscopy or flexible sigmoidoscopy) you may notice spotting of blood in your stool or on the toilet paper. If you underwent a bowel prep for your procedure, then you may not have a normal bowel movement for a few days.  DIET: Your first meal following the procedure should be a light meal and then it is ok to progress to your normal diet.  A half-sandwich or bowl of soup is an example of a good first meal.  Heavy or fried foods are harder to digest and may make you feel nauseous or bloated.  Likewise meals heavy in dairy and vegetables can cause extra gas to form and this can also increase the bloating.  Drink plenty of fluids but you should avoid alcoholic beverages for 24 hours.  ACTIVITY: Your care partner should take you home directly after the procedure.  You should plan to take it easy, moving slowly for the rest of the day.  You can resume normal activity the day after the procedure however you should NOT DRIVE or use heavy machinery for 24 hours (because of the sedation medicines used during the test).    SYMPTOMS TO REPORT IMMEDIATELY: A gastroenterologist can be reached at any hour.  During normal business hours, 8:30 AM to 5:00 PM Monday through Friday,  call [phone].  After hours and on weekends, please call the GI answering service at [phone] who will take a message and have the physician on call contact you.   Following lower endoscopy (colonoscopy or flexible sigmoidoscopy):  Excessive amounts of blood in the stool  Significant tenderness or worsening of abdominal pains  Swelling of the abdomen that is new, acute  Fever of 100F or higher    FOLLOW UP: If any biopsies were taken you will be contacted by phone or by letter within the next 1-3 weeks.  Call your gastroenterologist if you have not heard about the biopsies in 3 weeks.  Our staff will call the home number listed on your records the next business day following your procedure to check on you and address any questions or concerns that you may have at that time regarding the information given to you following your procedure. This is a courtesy call and so if there is no answer at the home number and we have not heard from you through the emergency physician on call, we will assume that you have returned to your regular daily activities without incident.  SIGNATURES/CONFIDENTIALITY: You and/or your care partner have signed paperwork which will be entered into your electronic medical record.  These signatures attest to the fact that that the information above on your After Visit Summary has been reviewed and is understood.  Full responsibility of the confidentiality   of this discharge information lies with you and/or your care-partner.  Polyps and diverticulosis and high fiber diet information given.

## 2013-09-26 NOTE — Op Note (Signed)
[facility]  [address] [place], [id number]   COLONOSCOPY PROCEDURE REPORT  PATIENT: Paul [name], Paul [name]  MR#: [id number] BIRTHDATE: 04-Apr-1945 , 66  yrs. old GENDER: Male ENDOSCOPIST: Inda Castle, MD REFERRED BY: PROCEDURE DATE:  09/26/2013 PROCEDURE:   Colonoscopy with snare polypectomy First Screening Colonoscopy - Avg.  risk and is 50 yrs.  old or older - No.  Prior Negative Screening - Now for repeat screening. N/A  History of Adenoma - Now for follow-up colonoscopy & has been > or = to 3 yrs.  Yes hx of adenoma.  Has been 3 or more years since last colonoscopy.  Polyps Removed Today? Yes. ASA CLASS:   Class II INDICATIONS:Change in bowel habits and hematochezia, recent hospitalization for acute lower GI bleed MEDICATIONS: MAC sedation, administered by CRNA and propofol (Diprivan) 200mg  IV  DESCRIPTION OF PROCEDURE:   After the risks benefits and alternatives of the procedure were thoroughly explained, informed consent was obtained.  A digital rectal exam revealed no abnormalities of the rectum.   The [id number]  endoscope was introduced through the anus and advanced to the cecum, which was identified by both the appendix and ileocecal valve. No adverse events experienced.   The quality of the prep was Suprep good  The instrument was then slowly withdrawn as the colon was fully examined.      COLON FINDINGS: A sessile polyp measuring 5 mm in size was found in the descending colon.  A polypectomy was performed with a cold snare.  The resection was complete and the polyp tissue was completely retrieved.   Mild diverticulosis was noted in the sigmoid colon and descending colon.   The colon was otherwise normal.  There was no diverticulosis, inflammation, polyps or cancers unless previously stated.  Retroflexed views revealed no abnormalities. The time to cecum=2 minutes 23 seconds.  Withdrawal time=14 minutes 53 seconds.  The scope was  withdrawn and the procedure completed. COMPLICATIONS: There were no complications.  ENDOSCOPIC IMPRESSION: 1.   Sessile polyp measuring 5 mm in size was found in the descending colon; polypectomy was performed with a cold snare 2.   Mild diverticulosis was noted in the sigmoid colon and descending colon 3.   The colon was otherwise normal  Recent lower GI bleed secondary to diverticula  RECOMMENDATIONS: If the polyp(s) removed today are proven to be adenomatous (pre-cancerous) polyps, you will need a repeat colonoscopy in 5 years.  Otherwise you should continue to follow colorectal cancer screening guidelines for "routine risk" patients with colonoscopy in 10 years.  You will receive a letter within 1-2 weeks with the results of your biopsy as well as final recommendations.  Please call my office if you have not received a letter after 3 weeks.   eSigned:  Inda Castle, MD 09/26/2013 3:14 PM   cc: Lisabeth Pick, MD   PATIENT NAME:  Paul [name], Paul [name] MR#: [id number]

## 2013-09-26 NOTE — Progress Notes (Signed)
Report to PACU, RN, vss, BBS= Clear.  

## 2013-09-26 NOTE — Progress Notes (Signed)
Called to room to assist during endoscopic procedure.  Patient ID and intended procedure confirmed with present staff. Received instructions for my participation in the procedure from the performing physician.  

## 2013-09-27 ENCOUNTER — Telehealth: Payer: Self-pay | Admitting: *Deleted

## 2013-09-27 NOTE — Telephone Encounter (Signed)
  Follow up Call-  Call back number 09/26/2013  Post procedure Call Back phone  # [phone] cell  Permission to leave phone message Yes     No answer at # given.  Left message on voicemail.

## 2013-09-28 ENCOUNTER — Encounter: Payer: Self-pay | Admitting: Family Medicine

## 2013-09-28 ENCOUNTER — Ambulatory Visit (INDEPENDENT_AMBULATORY_CARE_PROVIDER_SITE_OTHER): Payer: Medicare Other | Admitting: Family Medicine

## 2013-09-28 ENCOUNTER — Other Ambulatory Visit: Payer: Self-pay

## 2013-09-28 VITALS — BP 140/72 | HR 72 | Temp 97.7°F | Wt 228.0 lb

## 2013-09-28 DIAGNOSIS — I10 Essential (primary) hypertension: Secondary | ICD-10-CM

## 2013-09-28 MED ORDER — HYDROCHLOROTHIAZIDE 25 MG PO TABS: 25.0000 mg | ORAL_TABLET | Freq: Every day | ORAL | Status: DC

## 2013-09-28 MED ORDER — AUTOLET IMPRESSION MISC: Status: AC

## 2013-09-28 NOTE — Assessment & Plan Note (Addendum)
Mildly poor control given DM and SBP goal <140 on Diltiazem (amlodipine too much swelling), hydralazine 50mg  BID, labetalol 200mg  BID, losartan 100mg  and bumex. Today, changed to hctz from bumex as no history of HF and hopefully more effective for BP control. Follow up 2 weeks after switch. Also may allow patient to stop potassium-bmet next visit.

## 2013-09-28 NOTE — Progress Notes (Signed)
Garret Reddish, MD Phone: [phone]  Subjective:   Paul [name] is a 68 y.o. year old very pleasant male patient who presents with the following:  Hypertension Controlled today per age, slightly high for DM. Bumex 2mg  daily, diltiazem 360mg , hydralazine 50 mg BID, labetalol 200mg  BID, losartan 100mg  Swelling with amlodipine so was switched to losartan. At home today, 169/100 before medications.  ROS- no chest pain or shortness of breath  Past Medical History- Patient Active Problem List   Diagnosis Date Noted  . DIABETES MELLITUS, TYPE II 07/28/2006    Priority: High  . HYPERTENSION 07/28/2006    Priority: Medium  . Diverticulosis of colon with hemorrhage 08/10/2013    Priority: Low  . Hx of adenomatous colonic polyps 08/10/2013  . Rectal bleed 07/26/2013  . Melanoma of skin, site unspecified 12/18/2012  . RETROGRADE EJACULATION 09/04/2009  . OBESITY 12/28/2007  . GOUT 06/28/2007  . ANEMIA 12/27/2006  . HYPERLIPIDEMIA 07/28/2006  . DEPRESSION 07/28/2006  . MICROALBUMINURIA 07/28/2006   Medications- reviewed and updated Current Outpatient Prescriptions  Medication Sig Dispense Refill  . atorvastatin (LIPITOR) 20 MG tablet Take 20 mg by mouth daily at 6 PM.      . Cholecalciferol (VITAMIN D-3) 1000 UNITS CAPS Take 2,000 Units by mouth daily.      Marland Kitchen diltiazem (CARDIZEM CD) 360 MG 24 hr capsule Take 360 mg by mouth daily with breakfast.      . hydrALAZINE (APRESOLINE) 50 MG tablet Take 50 mg by mouth 2 (two) times daily.      . Insulin Aspart Prot & Aspart (NOVOLOG 70/30 MIX) (70-30) 100 UNIT/ML Pen Inject 75 Units into the skin 2 (two) times daily with a meal.      . labetalol (NORMODYNE) 200 MG tablet Take 200 mg by mouth 2 (two) times daily.      Elmore Gu[employer] Devices (AUTOLET IMPRESSION) MISC You to check blood sugar 2 times a day  2 each  1  . losartan (COZAAR) 100 MG tablet Take 100 mg by mouth daily with breakfast.      . Multiple Vitamins-Minerals (CENTURY SENIOR)  TABS Take 1 tablet by mouth daily.      . Omega-3 Fatty Acids (FISH OIL) 1200 MG CAPS Take 1,200 mg by mouth daily.      Marland Kitchen PARoxetine (PAXIL) 20 MG tablet Take 20 mg by mouth daily.      . hydrochlorothiazide (HYDRODIURIL) 25 MG tablet Take 1 tablet (25 mg total) by mouth daily.  30 tablet  3   No current facility-administered medications for this visit.    Objective: BP 140/72  Pulse 72  Temp(Src) 97.7 F (36.5 C)  Wt 228 lb (103.42 kg) Gen: NAD, resting comfortably CV: RRR no murmurs rubs or gallops Lungs: CTAB no crackles, wheeze, rhonchi Abdomen: soft/nontender/nondistended/normal bowel sounds. No rebound or guarding.  Ext: trace edema Skin: warm, dry, no rash Neuro: grossly normal, moves all extremities  Manual recheck 142/78  Assessment/Plan:  HYPERTENSION Mildly poor control given DM and SBP goal <140 on Diltiazem (amlodipine too much swelling), hydralazine 50mg  BID, labetalol 200mg  BID, losartan 100mg  and bumex. Today, changed to hctz from bumex as no history of HF and hopefully more effective for BP control. Follow up 2 weeks after switch. Also may allow patient to stop potassium-bmet next visit.    Meds ordered this encounter  Medications  . hydrochlorothiazide (HYDRODIURIL) 25 MG tablet    Sig: Take 1 tablet (25 mg total) by mouth daily.  Dispense:  30 tablet    Refill:  3

## 2013-09-28 NOTE — Patient Instructions (Addendum)
Blood pressure  Poor control at home  Not at goal in office (slightly off)  Take bumex and potassium until you run out of bumex  Then stop both  Start taking hydrochlorothiazide  See me 2 weeks after and we will check blood pressure, your home cuff, and bloodwork for your potassium

## 2013-10-02 ENCOUNTER — Encounter: Payer: Self-pay | Admitting: Gastroenterology

## 2013-10-18 ENCOUNTER — Other Ambulatory Visit: Payer: Medicare Other

## 2013-10-22 ENCOUNTER — Encounter: Payer: Self-pay | Admitting: Endocrinology

## 2013-10-22 ENCOUNTER — Ambulatory Visit (INDEPENDENT_AMBULATORY_CARE_PROVIDER_SITE_OTHER): Payer: Medicare Other | Admitting: Endocrinology

## 2013-10-22 ENCOUNTER — Other Ambulatory Visit: Payer: Self-pay

## 2013-10-22 ENCOUNTER — Other Ambulatory Visit (INDEPENDENT_AMBULATORY_CARE_PROVIDER_SITE_OTHER): Payer: Medicare Other

## 2013-10-22 VITALS — BP 134/80 | HR 65 | Temp 98.3°F | Ht 64.0 in | Wt 229.0 lb

## 2013-10-22 DIAGNOSIS — E1121 Type 2 diabetes mellitus with diabetic nephropathy: Secondary | ICD-10-CM

## 2013-10-22 DIAGNOSIS — Z23 Encounter for immunization: Secondary | ICD-10-CM

## 2013-10-22 LAB — HEMOGLOBIN A1C: Hgb A1c MFr Bld: 8.8 % — ABNORMAL HIGH (ref 4.6–6.5)

## 2013-10-22 NOTE — Patient Instructions (Addendum)
check your blood sugar twice a day.  vary the time of day when you check, between before the 3 meals, and at bedtime.  also check if you have symptoms of your blood sugar being too high or too low.  please keep a record of the readings and bring it to your next appointment here.  please call us sooner if your blood sugar goes below 70, or if you have a lot of readings over 200.   Please increase the insulin to 75 units with breakfast (40 if you are going to be active), and 75 units with the evening meal.  However, if you are going to be active, take just 50 units with breakfast that day.   On this type of insulin schedule, you should eat meals on a regular schedule.  If a meal is missed or significantly delayed, your blood sugar could go low. Please come back for a follow-up appointment in January.

## 2013-10-22 NOTE — Progress Notes (Signed)
Subjective:    Patient ID: Paul [name], male    DOB: 09-06-1945, 68 y.o.   MRN: [id number]  HPI Pt returns for f/u of diabetes mellitus: DM type: Insulin-requiring type 2 Dx'ed: 8676 Complications: nephropathy Therapy: insulin since 2009 DKA: never Severe hypoglycemia: never Pancreatitis: never Other: he takes bid premixed insulin, because he says he can't remember the lunch insulin. Interval history:  Since last ov, he has had hypoglycemia approx twice a week.  This happens during the day, when a meal is delayed, or with activity.  he brings a record of his cbg's which i have reviewed today.  It varies from 50-200's.  There is no trend throughout the day.  Past Medical History  Diagnosis Date  . Diabetes mellitus     type 2  . Depression   . Hypertension   . Hyperlipidemia   . Microalbuminuria   . melanoma dx'd 12/2012    rt arm; surg   . Colon polyps 12/09/2009    Multiple in descending colon  . Diverticulosis 12/09/2009    Moderate    Past Surgical History  Procedure Laterality Date  . Tonsillectomy and adenoidectomy    . Mass excision  09/29/2011    Procedure: MINOR EXCISION OF MASS;  Surgeon: Pedro Earls, MD;  Location: [facility];  Service: General;  Laterality: Left;  Excision sebaceous cyst on neck  . Melanoma excision  12/2012    History   Social History  . Marital Status: Married    Spouse Name: N/A    Number of Children: 4  . Years of Education: N/A   Occupational History  . 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 Smoking status: Former Smoker    Types: Cigars, Cigarettes    Quit date: 09/13/1998  . Smokeless tobacco: Never Used  . Alcohol Use: No  . Drug Use: No  . Sexual Activity: No   Other Topics Concern  . Not on file   Social History Narrative  . No narrative on file    Current Outpatient Prescriptions on File Prior to Visit  Medication Sig Dispense Refill  . atorvastatin (LIPITOR) 20 MG  tablet Take 20 mg by mouth daily at 6 PM.      . Cholecalciferol (VITAMIN D-3) 1000 UNITS CAPS Take 2,000 Units by mouth daily.      Marland Kitchen diltiazem (CARDIZEM CD) 360 MG 24 hr capsule Take 360 mg by mouth daily with breakfast.      . hydrALAZINE (APRESOLINE) 50 MG tablet Take 50 mg by mouth 2 (two) times daily.      . hydrochlorothiazide (HYDRODIURIL) 25 MG tablet Take 1 tablet (25 mg total) by mouth daily.  30 tablet  3  . Insulin Aspart Prot & Aspart (NOVOLOG 70/30 MIX) (70-30) 100 UNIT/ML Pen Inject 75 Units into the skin 2 (two) times daily with a meal.      . labetalol (NORMODYNE) 200 MG tablet Take 200 mg by mouth 2 (two) times daily.      Elmore Guise Devices (AUTOLET IMPRESSION) MISC You to check blood sugar 2 times a day  2 each  1  . losartan (COZAAR) 100 MG tablet Take 100 mg by mouth daily with breakfast.      . Multiple Vitamins-Minerals (CENTURY SENIOR) TABS Take 1 tablet by mouth daily.      . Omega-3 Fatty Acids (FISH OIL) 1200 MG CAPS Take 1,200 mg by mouth daily.      Marland Kitchen  PARoxetine (PAXIL) 20 MG tablet Take 20 mg by mouth daily.       No current facility-administered medications on file prior to visit.    Allergies  Allergen Reactions  . Amlodipine Swelling    Family History  Problem Relation Age of Onset  . Cancer Mother     breast  . Heart attack Mother   . Hypertension Father   . Cerebral aneurysm Father     BP 134/80  Pulse 65  Temp(Src) 98.3 F (36.8 C) (Oral)  Ht 5\' 4"  (1.626 m)  Wt 229 lb (103.874 kg)  BMI 39.29 kg/m2  SpO2 93%      Review of Systems He denies LOC and weight change.      Objective:   Physical Exam VITAL SIGNS:  See vs page GENERAL: no distress Pulses: dorsalis pedis intact bilat.   Feet: no deformity.  1+ bilat leg edema Skin:  no ulcer on the feet.  normal color and temp. Neuro: sensation is intact to touch on the feet.    Lab Results  Component Value Date   HGBA1C 8.8* 10/22/2013      Assessment & Plan:  DM: moderate  exacerbation Hypoglycemia, new: this limits use of this insulin.  i told pt that he should consider changing to qd insulin.    Patient is advised the following: Patient Instructions  check your blood sugar twice a day.  vary the time of day when you check, between before the 3 meals, and at bedtime.  also check if you have symptoms of your blood sugar being too high or too low.  please keep a record of the readings and bring it to your next appointment here.  please call us sooner if your blood sugar goes below 70, or if you have a lot of readings over 200.   Please increase the insulin to 75 units with breakfast (40 if you are going to be active), and 75 units with the evening meal.  However, if you are going to be active, take just 50 units with breakfast that day.   On this type of insulin schedule, you should eat meals on a regular schedule.  If a meal is missed or significantly delayed, your blood sugar could go low. Please come back for a follow-up appointment in January.

## 2013-10-24 ENCOUNTER — Telehealth: Payer: Self-pay

## 2013-10-24 NOTE — Telephone Encounter (Signed)
Left a message for call back.  Called patient regarding diabetic eye exam.  When patient calls back please ask:  Have you had a recent (2014-2015) eye exam?    Date of Exam?  Where?    

## 2013-11-14 NOTE — Telephone Encounter (Signed)
Left a message for call back.  

## 2013-11-21 ENCOUNTER — Telehealth: Payer: Self-pay | Admitting: Internal Medicine

## 2013-11-21 NOTE — Telephone Encounter (Signed)
Patient Information:  Caller Name: Herbie Baltimore  Phone: [phone]  Patient: Paul [name], Paul [name]  Gender: Male  DOB: [birth year]  Age: 68 Years  PCP: Garret Reddish  Office Follow Up:  Does the office need to follow up with this patient?: No  Instructions For The Office: N/A  RN Note:  FBS 149. Advised to see MD within 72 hours per nursing judgement.  Declined apointment for 11/21/13 since traveling out of town.  Available 11/22/13 after 1500.  Symptoms  Reason For Call & Symptoms: Hypertension.  BP 173/100  L arm at 0830 on 11/21/13.  MD changed from Bumax to HCTZ 25 mg.  Pt doubled dose of HCTZ to 50 mg 11/19/13 without significant improvement to BP.  Highest BP: 201/114 on 11/17/13.  Average BP 179/101 for past week.  Reviewed Health History In EMR: Yes  Reviewed Medications In EMR: Yes  Reviewed Allergies In EMR: Yes  Reviewed Surgeries / Procedures: Yes  Date of Onset of Symptoms: 11/17/2013  Treatments Tried: MD stopped Bumax and started HCTZ 25 mg, Pt increased dose of HCTZ to 50 mg.  Treatments Tried Worked: No  Guideline(s) Used:  High Blood Pressure  Disposition Per Guideline:   See Within 2 Weeks in Office  Reason For Disposition Reached:   BP > 160/100  Advice Given:  General:  Untreated high blood pressure may cause damage to the heart, brain, kidneys, and eyes.  Treatment of high blood pressure can reduce the risk of stroke, heart attack, and heart failure.  Call Back If:  Headache, blurred vision, difficulty talking, or difficulty walking occurs  Chest pain or difficulty breathing occurs  You want to go in to the office for a blood pressure check  You become worse.  RN Overrode Recommendation:  Make Appointment  Per nursing judegment since pt altered own BP dose without improvement.  Appointment Scheduled:  11/22/2013 15:00:00 Appointment Scheduled Provider:  Garret Reddish

## 2013-11-21 NOTE — Telephone Encounter (Signed)
Pt called and wanted to speak w/ Keba. After talking further w/ pt, he states his bp is 214/115.  Advised pt he would need to speak w/ triage.

## 2013-11-22 ENCOUNTER — Ambulatory Visit (INDEPENDENT_AMBULATORY_CARE_PROVIDER_SITE_OTHER): Payer: Medicare Other | Admitting: Family Medicine

## 2013-11-22 ENCOUNTER — Encounter: Payer: Self-pay | Admitting: Family Medicine

## 2013-11-22 VITALS — BP 132/70 | Temp 98.9°F | Wt 232.0 lb

## 2013-11-22 DIAGNOSIS — I10 Essential (primary) hypertension: Secondary | ICD-10-CM

## 2013-11-22 DIAGNOSIS — R609 Edema, unspecified: Secondary | ICD-10-CM

## 2013-11-22 NOTE — Progress Notes (Signed)
Pre visit review using our clinic review tool, if applicable. No additional management support is needed unless otherwise documented below in the visit note. 

## 2013-11-22 NOTE — Patient Instructions (Signed)
Blood pressure looks fine. Continue 25 mg HCTZ alone.   Follow up if home readings above 150/90 within 2 weeks.   Get echo of the heart to assess for reduced relaxation of the heart.   Health Maintenance Due  Topic Date Due  . OPHTHALMOLOGY EXAM -get up to date on this and have them send [place] records 10/18/2013

## 2013-11-22 NOTE — Assessment & Plan Note (Signed)
Well controlled today. Continue diltiazem, hydralazine, labetalol, losartan as well as hctz (new start and change from bumex). Due to edema off bumex-I do want to get a baseline echo as I am suspicious for diastolic dysfunction.

## 2013-11-22 NOTE — Progress Notes (Addendum)
Paul Reddish, MD Phone: [phone]  Subjective:   Paul [name] is a 68 y.o. year old very pleasant male patient who presents with the following:  Hypertension-well controlled  BP Readings from Last 3 Encounters:  11/22/13 132/70  10/22/13 134/80  09/28/13 140/72   Home BP monitoring-Home readings 160/90 typically until changed to HCTZ from bumex 4 days ago. Similar readings until today when he got 144/72 right after our check was at goal (suspect home cuff about 10 pts high) Compliant with medications-yes without side effects except edema since coming off bumex ROS-Denies any CP, HA, SOB, blurry vision. Endorses worsening LE edema off bumex  Past Medical History- Patient Active Problem List   Diagnosis Date Noted  . Diabetes 07/28/2006    Priority: High  . Essential hypertension 07/28/2006    Priority: Medium  . Diverticulosis of colon with hemorrhage 08/10/2013    Priority: Low  . Hx of adenomatous colonic polyps 08/10/2013  . Rectal bleed 07/26/2013  . Melanoma of skin, site unspecified 12/18/2012  . RETROGRADE EJACULATION 09/04/2009  . OBESITY 12/28/2007  . GOUT 06/28/2007  . ANEMIA 12/27/2006  . HYPERLIPIDEMIA 07/28/2006  . DEPRESSION 07/28/2006  . MICROALBUMINURIA 07/28/2006   Medications- reviewed and updated Current Outpatient Prescriptions  Medication Sig Dispense Refill  . atorvastatin (LIPITOR) 20 MG tablet Take 20 mg by mouth daily at 6 PM.    . Cholecalciferol (VITAMIN D-3) 1000 UNITS CAPS Take 2,000 Units by mouth daily.    Marland Kitchen diltiazem (CARDIZEM CD) 360 MG 24 hr capsule Take 360 mg by mouth daily with breakfast.    . hydrALAZINE (APRESOLINE) 50 MG tablet Take 50 mg by mouth 2 (two) times daily.    . hydrochlorothiazide (HYDRODIURIL) 25 MG tablet Take 1 tablet (25 mg total) by mouth daily. 30 tablet 3  . Insulin Aspart Prot & Aspart (NOVOLOG 70/30 MIX) (70-30) 100 UNIT/ML Pen Inject 75 Units into the skin 2 (two) times daily with a meal.    .  labetalol (NORMODYNE) 200 MG tablet Take 200 mg by mouth 2 (two) times daily.    Elmore Guise Devices (AUTOLET IMPRESSION) MISC You to check blood sugar 2 times a day 2 each 1  . losartan (COZAAR) 100 MG tablet Take 100 mg by mouth daily with breakfast.    . Multiple Vitamins-Minerals (CENTURY SENIOR) TABS Take 1 tablet by mouth daily.    . Omega-3 Fatty Acids (FISH OIL) 1200 MG CAPS Take 1,200 mg by mouth daily.    Marland Kitchen PARoxetine (PAXIL) 20 MG tablet Take 20 mg by mouth daily.     No current facility-administered medications for this visit.    Objective: BP 132/70 mmHg  Temp(Src) 98.9 F (37.2 C) (Oral)  Wt 232 lb (105.235 kg) Gen: NAD, resting comfortably in chair CV: RRR 2/6 SEM RUSB. no rubs or gallops Lungs: CTAB no crackles, wheeze, rhonchi Abdomen: soft/nontender/nondistended/normal bowel sounds. obese Ext: 1+ edema (increased from trace) Skin: warm, dry, no rash Neuro: grossly normal, moves all extremities   Assessment/Plan:  Essential hypertension Well controlled today. Continue diltiazem, hydralazine, labetalol, losartan as well as hctz (new start and change from bumex). Due to edema off bumex-I do want to get a baseline echo as I am suspicious for diastolic dysfunction.   Return precautions advised. Also noted 2/6 SEM not previously noted.    Orders Placed This Encounter  Procedures  . 2D Echocardiogram without contrast    Standing Status: Future     Number of Occurrences:  Standing Expiration Date: 11/23/2014    Scheduling Instructions:     Edema and HTN-suspect diastolic dysfunction-had been on bumex    Order Specific Question:  Type of Echo    Answer:  Complete    Order Specific Question:  Where should this test be performed    Answer:  [facility])    Order Specific Question:  Reason for exam-Echo    Answer:  Other - See Comments Section

## 2013-11-22 NOTE — Addendum Note (Signed)
Addended by: Marin Olp on: 11/22/2013 04:02 PM   Modules accepted: Miquel Dunn

## 2013-11-26 ENCOUNTER — Encounter: Payer: Self-pay | Admitting: Family Medicine

## 2013-11-26 ENCOUNTER — Ambulatory Visit (HOSPITAL_COMMUNITY)
Admission: RE | Admit: 2013-11-26 | Discharge: 2013-11-26 | Disposition: A | Discharge status: Home / Self Care | Payer: Medicare Other | Source: Ambulatory Visit | Attending: Family Medicine | Admitting: Family Medicine

## 2013-11-26 DIAGNOSIS — E119 Type 2 diabetes mellitus without complications: Secondary | ICD-10-CM | POA: Insufficient documentation

## 2013-11-26 DIAGNOSIS — I359 Nonrheumatic aortic valve disorder, unspecified: Secondary | ICD-10-CM

## 2013-11-26 DIAGNOSIS — I10 Essential (primary) hypertension: Secondary | ICD-10-CM | POA: Diagnosis not present

## 2013-11-26 DIAGNOSIS — E785 Hyperlipidemia, unspecified: Secondary | ICD-10-CM | POA: Insufficient documentation

## 2013-11-26 DIAGNOSIS — Z720 Tobacco use: Secondary | ICD-10-CM | POA: Diagnosis not present

## 2013-11-26 DIAGNOSIS — I350 Nonrheumatic aortic (valve) stenosis: Secondary | ICD-10-CM | POA: Insufficient documentation

## 2013-11-26 DIAGNOSIS — R010 Benign and innocent cardiac murmurs: Secondary | ICD-10-CM

## 2013-11-26 DIAGNOSIS — I5032 Chronic diastolic (congestive) heart failure: Secondary | ICD-10-CM | POA: Insufficient documentation

## 2013-11-26 DIAGNOSIS — R011 Cardiac murmur, unspecified: Secondary | ICD-10-CM | POA: Diagnosis present

## 2013-11-26 NOTE — Progress Notes (Signed)
2D Echocardiogram Complete.  11/26/2013   Sebrina Kessner [place], [place]

## 2013-12-07 ENCOUNTER — Telehealth: Payer: Self-pay | Admitting: Family Medicine

## 2013-12-07 ENCOUNTER — Encounter: Payer: Self-pay | Admitting: Family Medicine

## 2013-12-07 ENCOUNTER — Ambulatory Visit (INDEPENDENT_AMBULATORY_CARE_PROVIDER_SITE_OTHER): Payer: Medicare Other | Admitting: Family Medicine

## 2013-12-07 VITALS — BP 162/80 | Temp 98.0°F | Wt 231.0 lb

## 2013-12-07 DIAGNOSIS — I10 Essential (primary) hypertension: Secondary | ICD-10-CM

## 2013-12-07 MED ORDER — VALSARTAN 320 MG PO TABS: 320.0000 mg | ORAL_TABLET | Freq: Every day | ORAL | Status: DC

## 2013-12-07 MED ORDER — BUMETANIDE 1 MG PO TABS: 1.0000 mg | ORAL_TABLET | Freq: Every day | ORAL | Status: DC

## 2013-12-07 NOTE — Patient Instructions (Signed)
Stop Hydrochlorothiazide Stop losartan  Start Valsartan 1/2 tab of 320 mg Start Bumex 1mg  (either pick up what I sent in or take 1/2 tab)  Call me Tuesday with your home readings. Likely next step would be full tab of valsartan.   The next week we may check your kidney function and potassium.

## 2013-12-07 NOTE — Progress Notes (Signed)
Paul Reddish, MD Phone: [phone]  Subjective:   Paul [name] is a 68 y.o. year old very pleasant male patient who presents with the following:  Hypertension-poor control  BP Readings from Last 3 Encounters:  12/07/13 162/80  11/22/13 132/70  10/22/13 134/80   Home BP monitoring-Home readings averaging 173/88 despite diltiazem 360mg  (taken off amlodipine due to severe swelling), hydralazine 50mg  TID, labetalol 200mg  BID, HCTZ 25mg  (thought perhaps would improve control over bumex, losartan 100mg .   Compliant with medications-yes without side effects except edema continues to slightly worsen since coming off bumex. No weight gain despite edema increase.  ROS-Denies any CP,  SOB, blurry vision. No HA at this time, just severeal days ago which resolved  Past Medical History- Patient Active Problem List   Diagnosis Date Noted  . Chronic diastolic heart failure 73/42/8768    Priority: High  . Diabetes 07/28/2006    Priority: High  . Essential hypertension 07/28/2006    Priority: High  . Mild aortic stenosis 11/26/2013    Priority: Medium  . Diverticulosis of colon with hemorrhage 08/10/2013    Priority: Low  . Hx of adenomatous colonic polyps 08/10/2013  . Rectal bleed 07/26/2013  . Melanoma of skin, site unspecified 12/18/2012  . RETROGRADE EJACULATION 09/04/2009  . OBESITY 12/28/2007  . GOUT 06/28/2007  . ANEMIA 12/27/2006  . HYPERLIPIDEMIA 07/28/2006  . DEPRESSION 07/28/2006  . MICROALBUMINURIA 07/28/2006   Medications- reviewed and updated Current Outpatient Prescriptions  Medication Sig Dispense Refill  . atorvastatin (LIPITOR) 20 MG tablet Take 20 mg by mouth daily at 6 PM.    . Cholecalciferol (VITAMIN D-3) 1000 UNITS CAPS Take 2,000 Units by mouth daily.    Marland Kitchen diltiazem (CARDIZEM CD) 360 MG 24 hr capsule Take 360 mg by mouth daily with breakfast.    . hydrALAZINE (APRESOLINE) 50 MG tablet Take 50 mg by mouth 2 (two) times daily.    . Insulin Aspart Prot &  Aspart (NOVOLOG 70/30 MIX) (70-30) 100 UNIT/ML Pen Inject 75 Units into the skin 2 (two) times daily with a meal.    . labetalol (NORMODYNE) 200 MG tablet Take 200 mg by mouth 2 (two) times daily.    Elmore Guise Devices (AUTOLET IMPRESSION) MISC You to check blood sugar 2 times a day 2 each 1  . Multiple Vitamins-Minerals (CENTURY SENIOR) TABS Take 1 tablet by mouth daily.    . Omega-3 Fatty Acids (FISH OIL) 1200 MG CAPS Take 1,200 mg by mouth daily.    Marland Kitchen PARoxetine (PAXIL) 20 MG tablet Take 20 mg by mouth daily.    . bumetanide (BUMEX) 1 MG tablet Take 1 tablet (1 mg total) by mouth daily. 30 tablet 1  . valsartan (DIOVAN) 320 MG tablet Take 1 tablet (320 mg total) by mouth daily. 30 tablet 1   No current facility-administered medications for this visit.    Objective: BP 162/80 mmHg  Temp(Src) 98 F (36.7 C)  Wt 231 lb (104.781 kg) Gen: NAD, resting comfortably in chair CV: RRR 2/6 SEM RUSB. no rubs or gallops Lungs: CTAB no crackles, wheeze, rhonchi Abdomen: soft/nontender/nondistended/normal bowel sounds. obese Ext: 1-2+ edema (increase from last visit) Skin: warm, dry, no rash   Assessment/Plan:  Essential hypertension continued poor control. Perhaps hctz not helpful given GFR 45-51. Change back to Bumex from HCTZ. Valsartan may be more helpful for BP so start 160mg . Titrate to 320 as next step then back to full bumex  (by phone then get repeat BMET)   Consider  dexamethasone suppression test (cushings) vs. Spironolactone 12.5mg  vs. OSA testing  on the d a few minutes.elayed

## 2013-12-07 NOTE — Telephone Encounter (Signed)
Patient Information:  Caller Name: Job  Phone: [phone]  Patient: Paul [name], [name]  Gender: Male  DOB: 02-25-1945  Age: 68 Years  PCP: Garret Reddish  Office Follow Up:  Does the office need to follow up with this patient?: Yes  Instructions For The Office: See RN NOTE.  RN Note:  Report to [place] who advises note via Epic for Dr. Ansel Bong review and office staff will call patient back. PLEASE CALL PATIENT ON HIS CELL [phone].  Symptoms  Reason For Call & Symptoms: BP today 179/99 and patient reports Dr. Yong Channel has been working on getting the right BP medication to control BP. Patient reports uncontrolled BP x "months now". Patient currently with headache and "not feeling good."  Reviewed Health History In EMR: Yes  Reviewed Medications In EMR: Yes  Reviewed Allergies In EMR: Yes  Reviewed Surgeries / Procedures: Yes  Date of Onset of Symptoms: 12/07/2013  Guideline(s) Used:  High Blood Pressure  Headache  Disposition Per Guideline:   Discuss with PCP and Callback by Nurse Today  Reason For Disposition Reached:   Taking BP medications and feels is having side effects (e.g., impotence, cough, dizziness)  Advice Given:  N/A  Patient Will Follow Care Advice:  YES

## 2013-12-07 NOTE — Telephone Encounter (Signed)
S/w pt he will come in at 4:30

## 2013-12-07 NOTE — Telephone Encounter (Signed)
Mrs Malachy Mood can you see if pt would like to come in at 4:15? If so please create a slot per Dr. Yong Channel.

## 2013-12-07 NOTE — Telephone Encounter (Signed)
2 options: 1. Go to ER 2. try to work him in at 4:30 today  I think either are reasonable as long as the "not feeling good" does not include chest pain or shortness of breath

## 2013-12-07 NOTE — Assessment & Plan Note (Signed)
continued poor control. Perhaps hctz not helpful given GFR 45-51. Change back to Bumex from HCTZ. Valsartan may be more helpful for BP so start 160mg . Titrate to 320 as next step then back to full bumex  (by phone then get repeat BMET)   Consider dexamethasone suppression test (cushings) vs. Spironolactone 12.5mg  vs. OSA testing

## 2013-12-12 ENCOUNTER — Telehealth: Payer: Self-pay | Admitting: Internal Medicine

## 2013-12-12 NOTE — Telephone Encounter (Signed)
Pt was told to call back with bp reading on 11-24 bp was 156/87 and today bp is 170/86. Pt stated md may change blood pressure med. [facility] [address]

## 2013-12-12 NOTE — Telephone Encounter (Signed)
FYI Dr. Hunter  

## 2013-12-12 NOTE — Telephone Encounter (Signed)
Increase valsartan to 320mg  and callback Monday. May increase bumex to 2mg  at that time. Mychart message sent-called pt and lvm for him to look at [facility].

## 2013-12-19 ENCOUNTER — Telehealth: Payer: Self-pay

## 2013-12-19 NOTE — Telephone Encounter (Signed)
Start Bumex 2mg  (up from 1mg ). Come see me next week if blood pressures remain elevated. i would like to talk to him about nephrology referral vs. Further testing such as a cortisol test to see if he could have Cushings which causes high blood pressure.

## 2013-12-19 NOTE — Telephone Encounter (Signed)
Pt notified and will call next week if BP continues to remain high

## 2013-12-19 NOTE — Telephone Encounter (Signed)
Pt states his BP is still running high ranging from on 12/18/13: 186/98- 200/111 176/92  12/19/13: 177/76 and this is with Valsartan 320mg . Please advise.

## 2013-12-25 ENCOUNTER — Telehealth: Payer: Self-pay | Admitting: Endocrinology

## 2013-12-25 NOTE — Telephone Encounter (Signed)
Contacted pt and advised that I had not tried to contact him on Dr. Cordelia Pen behalf and advised that I could not locate any information that needed to be relayed to him. Pt voiced understanding.

## 2013-12-25 NOTE — Telephone Encounter (Signed)
Patient is returning your call.  

## 2013-12-26 ENCOUNTER — Telehealth: Payer: Self-pay | Admitting: Internal Medicine

## 2013-12-26 DIAGNOSIS — I10 Essential (primary) hypertension: Secondary | ICD-10-CM

## 2013-12-26 NOTE — Telephone Encounter (Signed)
Pt was to inform you of his bp readings:  bp reading 209/106  12:12 am today bp  [id number]  12:30 am bp  177/100   6:49 am  7am today bp  152/83

## 2013-12-26 NOTE — Telephone Encounter (Signed)
FYI

## 2013-12-27 ENCOUNTER — Other Ambulatory Visit: Payer: Self-pay | Admitting: Family Medicine

## 2013-12-27 MED ORDER — SPIRONOLACTONE 25 MG PO TABS: 12.5000 mg | ORAL_TABLET | Freq: Every day | ORAL | Status: DC

## 2013-12-27 MED ORDER — PAROXETINE HCL 20 MG PO TABS: 20.0000 mg | ORAL_TABLET | Freq: Every day | ORAL | Status: DC

## 2013-12-27 MED ORDER — ATORVASTATIN CALCIUM 20 MG PO TABS: 20.0000 mg | ORAL_TABLET | Freq: Every day | ORAL | Status: DC

## 2013-12-27 NOTE — Telephone Encounter (Signed)
Received a fax from [place].  Pt has an appt to establish with Dr. Yong Channel on 01/02/14.

## 2013-12-27 NOTE — Telephone Encounter (Signed)
Called and spoke with pt and pt is aware.  

## 2013-12-27 NOTE — Telephone Encounter (Signed)
Start spironolactone 12.5mg  and see me back on Monday-Wednesday of next week as will need to recheck BP and potassium  Ordered sleep study as well to look for causes of resistant hypertension  Also want to discuss another blood test when he comes in called dexamethasone suppression test

## 2014-01-02 ENCOUNTER — Ambulatory Visit (INDEPENDENT_AMBULATORY_CARE_PROVIDER_SITE_OTHER): Payer: Medicare Other | Admitting: Family Medicine

## 2014-01-02 ENCOUNTER — Encounter: Payer: Self-pay | Admitting: Family Medicine

## 2014-01-02 VITALS — BP 160/82 | HR 60 | Temp 98.1°F | Wt 231.0 lb

## 2014-01-02 DIAGNOSIS — I5032 Chronic diastolic (congestive) heart failure: Secondary | ICD-10-CM

## 2014-01-02 DIAGNOSIS — Z87891 Personal history of nicotine dependence: Secondary | ICD-10-CM | POA: Insufficient documentation

## 2014-01-02 DIAGNOSIS — D6489 Other specified anemias: Secondary | ICD-10-CM

## 2014-01-02 DIAGNOSIS — I10 Essential (primary) hypertension: Secondary | ICD-10-CM

## 2014-01-02 MED ORDER — DEXAMETHASONE 1 MG PO TABS: 1.0000 mg | ORAL_TABLET | Freq: Once | ORAL | Status: DC

## 2014-01-02 NOTE — Assessment & Plan Note (Signed)
Check cbc today, hopeful for normal ranges (due to 07/2013 diverticular bleed)

## 2014-01-02 NOTE — Progress Notes (Signed)
Paul Reddish, MD Phone: [phone]  Subjective:  Patient presents today to establish care with me as their new primary care provider. Patient was formerly a patient of Dr. Leanne Chang. Chief complaint-noted.   Resistant Hypertension-poor control  BP Readings from Last 3 Encounters:  01/02/14 160/82  12/07/13 162/80  11/22/13 132/70  started spironolactone 12.5mg . Continuing prior meds Diltiazem (amlodipine too much swelling), hydralazine 50mg  BID, labetalol 200mg  BID, valsartan 320mg , bumex 2mg .   Home BP monitoring- yes getting high 150s to low 160s with controlled diastolic Compliant with medications-yes without side effects ROS-Denies any CP, HA, SOB, blurry vision, LE edema, transient weakness, orthopnea, PND.   Diastolic CHF-stable Weight stable. Shortness of breath with prolonged walkingbut generally does well ROS-improving swelling in legs  Anemia from diverticular bleed in July-previously improving Had improved after transfusion and stoppage of bleeding ROS- nobrbpr or melena  The following were reviewed and entered/updated in epic: Past Medical History  Diagnosis Date  . Diabetes mellitus     type 2  . Depression   . Hypertension   . Hyperlipidemia   . Microalbuminuria   . melanoma dx'd 12/2012    rt arm; surg   . Colon polyps 12/09/2009    Multiple in descending colon  . Diverticulosis 12/09/2009    Moderate   Patient Active Problem List   Diagnosis Date Noted  . Chronic diastolic heart failure 76/81/1572    Priority: High  . Melanoma of skin 12/18/2012    Priority: High  . Diabetes 07/28/2006    Priority: High  . Essential hypertension 07/28/2006    Priority: High  . Former smoker 01/02/2014    Priority: Medium  . Mild aortic stenosis 11/26/2013    Priority: Medium  . Hyperlipidemia 07/28/2006    Priority: Medium  . Depression 07/28/2006    Priority: Medium  . Diverticulosis of colon with hemorrhage 08/10/2013    Priority: Low  . Hx of  adenomatous colonic polyps 08/10/2013    Priority: Low  . Erectile dysfunction 09/04/2009    Priority: Low  . Obesity 12/28/2007    Priority: Low  . Gout 06/28/2007    Priority: Low  . Anemia 12/27/2006    Priority: Low  . MICROALBUMINURIA 07/28/2006    Priority: Low   Past Surgical History  Procedure Laterality Date  . Tonsillectomy and adenoidectomy    . Mass excision  09/29/2011    [facility]-Excision sebaceous cyst on neck  . Melanoma excision  12/2012    Family History  Problem Relation Age of Onset  . Cancer Mother     breast  . Heart attack Mother     24 from chemo  . Hypertension Father   . Cerebral aneurysm Father     Medications- reviewed and updated Current Outpatient Prescriptions  Medication Sig Dispense Refill  . atorvastatin (LIPITOR) 20 MG tablet Take 1 tablet (20 mg total) by mouth daily at 6 PM. 90 tablet 0  . bumetanide (BUMEX) 1 MG tablet Take 1 tablet (1 mg total) by mouth daily. 30 tablet 1  . Cholecalciferol (VITAMIN D-3) 1000 UNITS CAPS Take 2,000 Units by mouth daily.    Marland Kitchen diltiazem (CARDIZEM CD) 360 MG 24 hr capsule Take 360 mg by mouth daily with breakfast.    . hydrALAZINE (APRESOLINE) 50 MG tablet Take 50 mg by mouth 2 (two) times daily.    . Insulin Aspart Prot & Aspart (NOVOLOG 70/30 MIX) (70-30) 100 UNIT/ML Pen Inject 75 Units into the skin 2 (two) times daily with  a meal.    . labetalol (NORMODYNE) 200 MG tablet Take 200 mg by mouth 2 (two) times daily.    Elmore Guise Devices (AUTOLET IMPRESSION) MISC You to check blood sugar 2 times a day 2 each 1  . Multiple Vitamins-Minerals (CENTURY SENIOR) TABS Take 1 tablet by mouth daily.    . Omega-3 Fatty Acids (FISH OIL) 1200 MG CAPS Take 1,200 mg by mouth daily.    Marland Kitchen PARoxetine (PAXIL) 20 MG tablet Take 1 tablet (20 mg total) by mouth daily. 90 tablet 0  . spironolactone (ALDACTONE) 25 MG tablet Take 0.5 tablets (12.5 mg total) by mouth daily. 30 tablet 3  . valsartan (DIOVAN) 320 MG tablet Take 1 tablet  (320 mg total) by mouth daily. 30 tablet 1  . dexamethasone (DECADRON) 1 MG tablet Take 1 tablet (1 mg total) by mouth once. Between 11pm and midnight on 01/02/14. Come for fasting labs next morning at 8 am. 1 tablet 0   No current facility-administered medications for this visit.    Allergies-reviewed and updated Allergies  Allergen Reactions  . Amlodipine Swelling    History   Social History  . Marital Status: Married    Spouse Name: N/A    Number of Children: 4  . Years of Education: N/A   Occupational History  . 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 Smoking status: Former Smoker -- 1.50 packs/day for 45 years    Types: Cigars, Cigarettes    Quit date: 09/13/1998  . Smokeless tobacco: Never Used  . Alcohol Use: 0.0 oz/week    0 Not specified per week     Comment: 1-2x a month  . Drug Use: No  . Sexual Activity: No   Other Topics Concern  . Not on file   Social History Narrative   Married 4 kids. 6 grandkids.       Retired from [employer]: travel, reading    ROS--See HPI   Objective: BP 160/82 mmHg  Pulse 60  Temp(Src) 98.1 F (36.7 C)  Wt 231 lb (104.781 kg) Gen: NAD, resting comfortably No clear buffalo bump CV: RRR 2/6 SEM rubs or gallops Lungs: CTAB no crackles, wheeze, rhonchi Abdomen: soft/nontender/nondistended/normal bowel sounds. No rebound or guarding. Obese Ext: trace edema Skin: warm, dry, no rash Neuro: grossly normal, moves all extremities   Assessment/Plan:  Essential hypertension Continued poor control despite Diltiazem (amlodipine too much swelling), hydralazine 50mg  BID, labetalol 200mg  BID, valsartan 320mg , bumex 2mg , spironolactone 12.5mg . Check potassium, if not elevated, will increase to spironolactone 25mg . Sleep study already ordered and awaiting set up. Dexamethasone 1mg  suppression test as per AVS to evaluate cushings. Next choice likely clonidine. Follow up 1 week.   Anemia Check cbc today,  hopeful for normal ranges (due to 07/2013 diverticular bleed)  Chronic diastolic heart failure Stable weight on bumex. Edema has improved to trace. Continue bumex 2mg . Spironolactone will help some too. Most importantly, need to control HTN to reduce risk of progression.   follow up 1 week.   Orders Placed This Encounter  Procedures  . Basic metabolic panel    [place]    Standing Status: Future     Number of Occurrences:      Standing Expiration Date: 01/17/2014  . CBC    [place]    Standing Status: Future     Number of Occurrences:      Standing Expiration Date: 01/17/2014  . Cortisol-am, blood  Standing Status: Future     Number of Occurrences:      Standing Expiration Date: 01/17/2014  . Dexamethasone, blood    Standing Status: Future     Number of Occurrences:      Standing Expiration Date: 01/17/2014    Meds ordered this encounter  Medications  . dexamethasone (DECADRON) 1 MG tablet    Sig: Take 1 tablet (1 mg total) by mouth once. Between 11pm and midnight on 01/02/14. Come for fasting labs next morning at 8 am.    Dispense:  1 tablet    Refill:  0

## 2014-01-02 NOTE — Patient Instructions (Signed)
Continue current medicines. If potassium is ok, we will increase spironolactone to 25 mg and see you back next week to recheck blood pressure and potassium.   Take dexamethasone 1mg  tonight between 11 pm and midnight. Come in for labs tomorrow morning at 8 am fasting.

## 2014-01-02 NOTE — Assessment & Plan Note (Signed)
Continued poor control despite Diltiazem (amlodipine too much swelling), hydralazine 50mg  BID, labetalol 200mg  BID, valsartan 320mg , bumex 2mg , spironolactone 12.5mg . Check potassium, if not elevated, will increase to spironolactone 25mg . Sleep study already ordered and awaiting set up. Dexamethasone 1mg  suppression test as per AVS to evaluate cushings. Next choice likely clonidine. Follow up 1 week.

## 2014-01-02 NOTE — Assessment & Plan Note (Signed)
Stable weight on bumex. Edema has improved to trace. Continue bumex 2mg . Spironolactone will help some too. Most importantly, need to control HTN to reduce risk of progression.

## 2014-01-03 ENCOUNTER — Other Ambulatory Visit (INDEPENDENT_AMBULATORY_CARE_PROVIDER_SITE_OTHER): Payer: Medicare Other

## 2014-01-03 DIAGNOSIS — I10 Essential (primary) hypertension: Secondary | ICD-10-CM

## 2014-01-03 LAB — BASIC METABOLIC PANEL
BUN: 24 mg/dL — ABNORMAL HIGH (ref 6–23)
CHLORIDE: 103 meq/L (ref 96–112)
CO2: 23 mEq/L (ref 19–32)
Calcium: 9.5 mg/dL (ref 8.4–10.5)
Creatinine, Ser: 1.5 mg/dL (ref 0.4–1.5)
GFR: 51.08 mL/min — AB (ref >60.00)
Glucose, Bld: 271 mg/dL — ABNORMAL HIGH (ref 70–99)
POTASSIUM: 4.6 meq/L (ref 3.5–5.1)
Sodium: 137 mEq/L (ref 135–145)

## 2014-01-03 LAB — CBC
HEMATOCRIT: 43.1 % (ref 39.0–52.0)
Hemoglobin: 14.3 g/dL (ref 13.0–17.0)
MCHC: 33.3 g/dL (ref 30.0–36.0)
MCV: 91.3 fl (ref 78.0–100.0)
Platelets: 212 10*3/uL (ref 150.0–400.0)
RBC: 4.72 Mil/uL (ref 4.22–5.81)
RDW: 14.6 % (ref 11.5–15.5)
WBC: 7.9 10*3/uL (ref 4.0–10.5)

## 2014-01-04 ENCOUNTER — Encounter: Payer: Self-pay | Admitting: Endocrinology

## 2014-01-04 ENCOUNTER — Encounter: Payer: Self-pay | Admitting: Family Medicine

## 2014-01-04 LAB — CORTISOL-AM, BLOOD: CORTISOL - AM: 1.8 ug/dL — AB (ref 4.3–22.4)

## 2014-01-07 ENCOUNTER — Encounter: Payer: Self-pay | Admitting: Family Medicine

## 2014-01-16 LAB — DEXAMETHASONE, BLOOD: Dexamethasone, Serum: 433 ng/dL

## 2014-01-22 ENCOUNTER — Ambulatory Visit (INDEPENDENT_AMBULATORY_CARE_PROVIDER_SITE_OTHER): Payer: Medicare Other | Admitting: Endocrinology

## 2014-01-22 ENCOUNTER — Encounter: Payer: Self-pay | Admitting: Endocrinology

## 2014-01-22 VITALS — BP 136/84 | HR 80 | Temp 98.5°F | Ht 64.0 in | Wt 234.0 lb

## 2014-01-22 DIAGNOSIS — E1121 Type 2 diabetes mellitus with diabetic nephropathy: Secondary | ICD-10-CM

## 2014-01-22 NOTE — Patient Instructions (Addendum)
check your blood sugar twice a day.  vary the time of day when you check, between before the 3 meals, and at bedtime.  also check if you have symptoms of your blood sugar being too high or too low.  please keep a record of the readings and bring it to your next appointment here.  please call us sooner if your blood sugar goes below 70, or if you have a lot of readings over 200.    blood tests are being requested for you today.  We'll let you know about the results.   If it is high, we can change to the once a day insulin.   On this type of insulin schedule, you should eat meals on a regular schedule.  If a meal is missed or significantly delayed, your blood sugar could go low.

## 2014-01-22 NOTE — Progress Notes (Signed)
Subjective:    Patient ID: Paul [name], male    DOB: 08-18-1945, 69 y.o.   MRN: [id number]  HPI Pt returns for f/u of diabetes mellitus: DM type: Insulin-requiring type 2 Dx'ed: 7782 Complications: nephropathy Therapy: insulin since 2009 DKA: never Severe hypoglycemia: never Pancreatitis: never Other: he takes bid premixed insulin, because he says he can't remember the lunch insulin. Interval history: Pt says he seldom checks cbg's, but it varies from 120-200.  pt states he feels well in general.  He says he does not miss any insulin doses.   Past Medical History  Diagnosis Date  . Diabetes mellitus     type 2  . Depression   . Hypertension   . Hyperlipidemia   . Microalbuminuria   . melanoma dx'd 12/2012    rt arm; surg   . Colon polyps 12/09/2009    Multiple in descending colon  . Diverticulosis 12/09/2009    Moderate    Past Surgical History  Procedure Laterality Date  . Tonsillectomy and adenoidectomy    . Mass excision  09/29/2011    [facility]-Excision sebaceous cyst on neck  . Melanoma excision  12/2012    History   Social History  . Marital Status: Married    Spouse Name: N/A    Number of Children: 4  . Years of Education: N/A   Occupational History  . 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 Smoking status: Former Smoker -- 1.50 packs/day for 45 years    Types: Cigars, Cigarettes    Quit date: 09/13/1998  . Smokeless tobacco: Never Used  . Alcohol Use: 0.0 oz/week    0 Not specified per week     Comment: 1-2x a month  . Drug Use: No  . Sexual Activity: No   Other Topics Concern  . Not on file   Social History Narrative   Married 4 kids. 6 grandkids.       Retired from [employer]: travel, reading    Current Outpatient Prescriptions on File Prior to Visit  Medication Sig Dispense Refill  . atorvastatin (LIPITOR) 20 MG tablet Take 1 tablet (20 mg total) by mouth daily at 6 PM. 90 tablet 0  . bumetanide  (BUMEX) 1 MG tablet Take 1 tablet (1 mg total) by mouth daily. 30 tablet 1  . Cholecalciferol (VITAMIN D-3) 1000 UNITS CAPS Take 2,000 Units by mouth daily.    Marland Kitchen dexamethasone (DECADRON) 1 MG tablet Take 1 tablet (1 mg total) by mouth once. Between 11pm and midnight on 01/02/14. Come for fasting labs next morning at 8 am. 1 tablet 0  . diltiazem (CARDIZEM CD) 360 MG 24 hr capsule Take 360 mg by mouth daily with breakfast.    . hydrALAZINE (APRESOLINE) 50 MG tablet Take 50 mg by mouth 2 (two) times daily.    Marland Kitchen labetalol (NORMODYNE) 200 MG tablet Take 200 mg by mouth 2 (two) times daily.    Elmore Guise Devices (AUTOLET IMPRESSION) MISC You to check blood sugar 2 times a day 2 each 1  . Multiple Vitamins-Minerals (CENTURY SENIOR) TABS Take 1 tablet by mouth daily.    . Omega-3 Fatty Acids (FISH OIL) 1200 MG CAPS Take 1,200 mg by mouth daily.    Marland Kitchen PARoxetine (PAXIL) 20 MG tablet Take 1 tablet (20 mg total) by mouth daily. 90 tablet 0  . spironolactone (ALDACTONE) 25 MG tablet Take 0.5 tablets (12.5 mg total) by mouth daily. [place]  tablet 3  . valsartan (DIOVAN) 320 MG tablet Take 1 tablet (320 mg total) by mouth daily. 30 tablet 1   No current facility-administered medications on file prior to visit.    Allergies  Allergen Reactions  . Amlodipine Swelling    Family History  Problem Relation Age of Onset  . Cancer Mother     breast  . Heart attack Mother     29 from chemo  . Hypertension Father   . Cerebral aneurysm Father     BP 136/84 mmHg  Pulse 80  Temp(Src) 98.5 F (36.9 C) (Oral)  Ht 5\' 4"  (1.626 m)  Wt 234 lb (106.142 kg)  BMI 40.15 kg/m2  SpO2 95%  Review of Systems He denies hypoglycemia and weight change.      Objective:   Physical Exam VITAL SIGNS:  See vs page GENERAL: no distress Pulses: dorsalis pedis intact bilat.   MSK: no deformity of the feet CV: trace bilat leg edema Skin:  no ulcer on the feet.  normal color and temp on the feet.  There is spotty  hyperpigmentation of the legs and feet.   Neuro: sensation is intact to touch on the feet.   Lab Results  Component Value Date   HGBA1C 8.3* 01/22/2014      Assessment & Plan:  DM: moderate exacerbation Noncompliance with cbg recording, worse: I'll work around this as best I can.  He may do better on a simpler insulin regimen.     Patient is advised the following: Patient Instructions  check your blood sugar twice a day.  vary the time of day when you check, between before the 3 meals, and at bedtime.  also check if you have symptoms of your blood sugar being too high or too low.  please keep a record of the readings and bring it to your next appointment here.  please call us sooner if your blood sugar goes below 70, or if you have a lot of readings over 200.    blood tests are being requested for you today.  We'll let you know about the results.   If it is high, we can change to the once a day insulin.   On this type of insulin schedule, you should eat meals on a regular schedule.  If a meal is missed or significantly delayed, your blood sugar could go low.

## 2014-01-23 LAB — HEMOGLOBIN A1C
Hgb A1c MFr Bld: 8.3 % — ABNORMAL HIGH (ref <5.7)
Mean Plasma Glucose: 192 mg/dL — ABNORMAL HIGH (ref <117)

## 2014-01-23 MED ORDER — INSULIN GLARGINE 100 UNIT/ML SOLOSTAR PEN: 140.0000 [IU] | PEN_INJECTOR | SUBCUTANEOUS | Status: DC

## 2014-01-28 LAB — HM DIABETES EYE EXAM

## 2014-01-29 ENCOUNTER — Encounter: Payer: Self-pay | Admitting: Family Medicine

## 2014-02-11 ENCOUNTER — Other Ambulatory Visit: Payer: Self-pay | Admitting: Family Medicine

## 2014-02-13 ENCOUNTER — Other Ambulatory Visit: Payer: Self-pay | Admitting: Family Medicine

## 2014-02-13 ENCOUNTER — Telehealth: Payer: Self-pay | Admitting: Family Medicine

## 2014-02-13 NOTE — Telephone Encounter (Signed)
Medication refilled

## 2014-02-13 NOTE — Telephone Encounter (Signed)
Pt request refill of the following: valsartan (DIOVAN) 320 MG tablet   Phamacy: [facility]  [address]

## 2014-03-05 NOTE — Telephone Encounter (Signed)
error 

## 2014-03-18 ENCOUNTER — Ambulatory Visit (HOSPITAL_BASED_OUTPATIENT_CLINIC_OR_DEPARTMENT_OTHER): Payer: Medicare Other | Attending: Family Medicine

## 2014-03-18 VITALS — Ht 65.0 in | Wt 230.0 lb

## 2014-03-18 DIAGNOSIS — I10 Essential (primary) hypertension: Secondary | ICD-10-CM | POA: Insufficient documentation

## 2014-03-24 ENCOUNTER — Encounter: Payer: Self-pay | Admitting: Family Medicine

## 2014-03-25 ENCOUNTER — Other Ambulatory Visit: Payer: Self-pay

## 2014-03-25 MED ORDER — DILTIAZEM HCL ER COATED BEADS 360 MG PO CP24: 360.0000 mg | ORAL_CAPSULE | Freq: Every day | ORAL | Status: DC

## 2014-03-27 ENCOUNTER — Encounter: Payer: Self-pay | Admitting: Family Medicine

## 2014-03-27 DIAGNOSIS — G4733 Obstructive sleep apnea (adult) (pediatric): Secondary | ICD-10-CM | POA: Diagnosis not present

## 2014-03-27 DIAGNOSIS — G4711 Idiopathic hypersomnia with long sleep time: Secondary | ICD-10-CM | POA: Diagnosis not present

## 2014-03-27 NOTE — Sleep Study (Signed)
   NAME: Paul [name] DATE OF BIRTH:  [birth year] MEDICAL RECORD NUMBER [id number]  LOCATION: [facility]  PHYSICIAN: Kathee Delton  DATE OF STUDY: 03/18/2014  SLEEP STUDY TYPE: Nocturnal Polysomnogram               REFERRING PHYSICIAN: Marin Olp, MD  INDICATION FOR STUDY: Hypersomnia with sleep apnea  EPWORTH SLEEPINESS SCORE:  3 HEIGHT: 5\' 5"  (165.1 cm)  WEIGHT: 230 lb (104.327 kg)    Body mass index is 38.27 kg/(m^2).  NECK SIZE: 18 in.  MEDICATIONS: Reviewed in the sleep record  SLEEP ARCHITECTURE: The patient had a total sleep time of 332 minutes with no slow-wave sleep and only 54 minutes of REM. Sleep onset latency was normal at 7 minutes, and REM did not occur until the titration portion of the study. Sleep efficiency was 80% during the diagnostic portion, an 84% during the titration portion.  RESPIRATORY DATA: The patient underwent a split night study where he was found to have 150 obstructive events in the first 130 minutes of sleep. This gave him an AHI during the diagnostic portion of the study of 70 events per hour. The events occurred in all body positions, and there was loud snoring noted throughout. By protocol, the patient was fitted with a Fisher-Paykel Simplus full face mask, and titrated to a final CPAP pressure of 16 cm of water.  OXYGEN DATA: There was transient oxygen desaturation as low as 78% with the patient's obstructive events  CARDIAC DATA: No clinically significant arrhythmias were noted  MOVEMENT/PARASOMNIA: No significant periodic limb movements or abnormal behaviors were seen.  IMPRESSION/ RECOMMENDATION:    1) split-night study reveals severe obstructive sleep apnea, with an AHI of 70 events per hour and oxygen desaturation as low as 78% during the diagnostic portion of the study. He was then fitted with a Fisher-Paykel Simplus full face mask, and found to have an optimal CPAP pressure of 16 cm of water.  He should  also be encouraged to work aggressively on weight loss.   [place], American Board of Sleep Medicine  ELECTRONICALLY SIGNED ON:  03/27/2014, 9:03 AM [place] PH: [phone]   FX: [phone] [place]

## 2014-03-29 ENCOUNTER — Encounter: Payer: Self-pay | Admitting: Family Medicine

## 2014-04-01 ENCOUNTER — Other Ambulatory Visit: Payer: Self-pay | Admitting: Family Medicine

## 2014-04-01 ENCOUNTER — Other Ambulatory Visit: Payer: Self-pay

## 2014-04-01 ENCOUNTER — Encounter: Payer: Self-pay | Admitting: Family Medicine

## 2014-04-01 MED ORDER — LABETALOL HCL 200 MG PO TABS: 200.0000 mg | ORAL_TABLET | Freq: Two times a day (BID) | ORAL | Status: DC

## 2014-04-01 MED ORDER — VALSARTAN 320 MG PO TABS: 320.0000 mg | ORAL_TABLET | Freq: Every day | ORAL | Status: DC

## 2014-06-05 ENCOUNTER — Encounter: Payer: Self-pay | Admitting: Endocrinology

## 2014-06-10 ENCOUNTER — Other Ambulatory Visit: Payer: Self-pay

## 2014-06-10 MED ORDER — ACCU-CHEK FASTCLIX LANCETS MISC
Status: DC
Start: 2014-06-10 — End: 2017-12-01

## 2014-06-11 ENCOUNTER — Encounter: Payer: Self-pay | Admitting: Endocrinology

## 2014-06-11 ENCOUNTER — Ambulatory Visit (INDEPENDENT_AMBULATORY_CARE_PROVIDER_SITE_OTHER): Payer: Medicare Other | Admitting: Endocrinology

## 2014-06-11 VITALS — BP 134/90 | HR 68 | Temp 98.4°F | Ht 64.0 in | Wt 233.0 lb

## 2014-06-11 DIAGNOSIS — E1121 Type 2 diabetes mellitus with diabetic nephropathy: Secondary | ICD-10-CM | POA: Diagnosis not present

## 2014-06-11 LAB — HEMOGLOBIN A1C: Hgb A1c MFr Bld: 6.8 % — ABNORMAL HIGH (ref 4.6–6.5)

## 2014-06-11 MED ORDER — INSULIN GLARGINE 100 UNIT/ML SOLOSTAR PEN: 140.0000 [IU] | PEN_INJECTOR | SUBCUTANEOUS | Status: DC

## 2014-06-11 NOTE — Progress Notes (Signed)
Subjective:    Patient ID: Paul [name], male    DOB: 12/26/1945, 69 y.o.   MRN: [id number]  HPI Pt returns for f/u of diabetes mellitus: DM type: Insulin-requiring type 2 Dx'ed: 5400 Complications: nephropathy Therapy: insulin since 2009 DKA: never Severe hypoglycemia: never Pancreatitis: never Other: he takes qd insulin, after poor results with taking it more frequently.   Interval history: Pt says he seldom checks cbg's, but it varies from 80-167.  There is no trend throughout the day.  pt states he feels well in general.  He says he does not miss any insulin doses.    Past Medical History  Diagnosis Date  . Diabetes mellitus     type 2  . Depression   . Hypertension   . Hyperlipidemia   . Microalbuminuria   . melanoma dx'd 12/2012    rt arm; surg   . Colon polyps 12/09/2009    Multiple in descending colon  . Diverticulosis 12/09/2009    Moderate    Past Surgical History  Procedure Laterality Date  . Tonsillectomy and adenoidectomy    . Mass excision  09/29/2011    [facility]-Excision sebaceous cyst on neck  . Melanoma excision  12/2012    History   Social History  . Marital Status: Married    Spouse Name: N/A  . Number of Children: 4  . Years of Education: N/A   Occupational History  . 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 Smoking status: Former Smoker -- 1.50 packs/day for 45 years    Types: Cigars, Cigarettes    Quit date: 09/13/1998  . Smokeless tobacco: Never Used  . Alcohol Use: 0.0 oz/week    0 Standard drinks or equivalent per week     Comment: 1-2x a month  . Drug Use: No  . Sexual Activity: No   Other Topics Concern  . Not on file   Social History Narrative   Married 4 kids. 6 grandkids.       Retired from [employer]: travel, reading    Current Outpatient Prescriptions on File Prior to Visit  Medication Sig Dispense Refill  . ACCU-CHEK FASTCLIX LANCETS MISC Use to check blood sugar 2 times per day 200  each 2  . atorvastatin (LIPITOR) 20 MG tablet Take 1 tablet (20 mg total) by mouth daily at 6 PM. 90 tablet 0  . bumetanide (BUMEX) 1 MG tablet TAKE 1 TABLET BY MOUTH EVERY DAY 30 tablet 3  . Cholecalciferol (VITAMIN D-3) 1000 UNITS CAPS Take 2,000 Units by mouth daily.    Marland Kitchen diltiazem (CARDIZEM CD) 360 MG 24 hr capsule Take 1 capsule (360 mg total) by mouth daily with breakfast. 90 capsule 3  . hydrALAZINE (APRESOLINE) 50 MG tablet Take 50 mg by mouth 2 (two) times daily.    Marland Kitchen labetalol (NORMODYNE) 200 MG tablet Take 1 tablet (200 mg total) by mouth 2 (two) times daily. 90 tablet 3  . Lancet Devices (AUTOLET IMPRESSION) MISC You to check blood sugar 2 times a day 2 each 1  . Multiple Vitamins-Minerals (CENTURY SENIOR) TABS Take 1 tablet by mouth daily.    . Omega-3 Fatty Acids (FISH OIL) 1200 MG CAPS Take 1,200 mg by mouth daily.    Marland Kitchen PARoxetine (PAXIL) 20 MG tablet Take 1 tablet (20 mg total) by mouth daily. 90 tablet 0  . spironolactone (ALDACTONE) 25 MG tablet Take 0.5 tablets (12.5 mg total) by mouth daily. (Patient  taking differently: Take 25 mg by mouth daily. ) 30 tablet 3  . valsartan (DIOVAN) 320 MG tablet Take 1 tablet (320 mg total) by mouth daily. 90 tablet 3   No current facility-administered medications on file prior to visit.    Allergies  Allergen Reactions  . Amlodipine Swelling    Family History  Problem Relation Age of Onset  . Cancer Mother     breast  . Heart attack Mother     54 from chemo  . Hypertension Father   . Cerebral aneurysm Father     BP 134/90 mmHg  Pulse 68  Temp(Src) 98.4 F (36.9 C) (Oral)  Ht 5\' 4"  (1.626 m)  Wt 233 lb (105.688 kg)  BMI 39.97 kg/m2  SpO2 98%  Review of Systems He denies hypoglycemia; he has gained weight.    Objective:   Physical Exam VITAL SIGNS:  See vs page GENERAL: no distress Pulses: dorsalis pedis intact bilat.   MSK: no deformity of the feet CV: 1+ bilat leg edema Skin:  no ulcer on the feet.  normal color  and temp on the feet. Neuro: sensation is intact to touch on the feet.     Lab Results  Component Value Date   HGBA1C 6.8* 06/11/2014       Assessment & Plan:  DM: overcontrolled, given this regimen, which does match insulin to his changing needs throughout the day. Obesity: worse  Patient is advised the following: Patient Instructions  check your blood sugar twice a day.  vary the time of day when you check, between before the 3 meals, and at bedtime.  also check if you have symptoms of your blood sugar being too high or too low.  please keep a record of the readings and bring it to your next appointment here.  please call us sooner if your blood sugar goes below 70, or if you have a lot of readings over 200.    blood tests are being requested for you today.  We'll let you know about the results.   On this type of insulin schedule, you should eat meals on a regular schedule.  If a meal is missed or significantly delayed, your blood sugar could go low. Please consider having weight loss surgery.  It is good for your health.  Here is some information about it.  If you decide to consider further, please call the phone number in the papers, and register for a free informational meeting.   Please come back for a follow-up appointment in 3 months.

## 2014-06-11 NOTE — Patient Instructions (Addendum)
check your blood sugar twice a day.  vary the time of day when you check, between before the 3 meals, and at bedtime.  also check if you have symptoms of your blood sugar being too high or too low.  please keep a record of the readings and bring it to your next appointment here.  please call us sooner if your blood sugar goes below 70, or if you have a lot of readings over 200.    blood tests are being requested for you today.  We'll let you know about the results.   On this type of insulin schedule, you should eat meals on a regular schedule.  If a meal is missed or significantly delayed, your blood sugar could go low. Please consider having weight loss surgery.  It is good for your health.  Here is some information about it.  If you decide to consider further, please call the phone number in the papers, and register for a free informational meeting.   Please come back for a follow-up appointment in 3 months.

## 2014-06-14 ENCOUNTER — Encounter: Payer: Self-pay | Admitting: Endocrinology

## 2014-06-18 ENCOUNTER — Other Ambulatory Visit: Payer: Self-pay

## 2014-06-18 ENCOUNTER — Encounter: Payer: Self-pay | Admitting: Family Medicine

## 2014-06-18 ENCOUNTER — Ambulatory Visit (INDEPENDENT_AMBULATORY_CARE_PROVIDER_SITE_OTHER): Payer: Medicare Other | Admitting: Family Medicine

## 2014-06-18 VITALS — BP 122/64 | HR 55 | Temp 98.0°F | Wt 233.0 lb

## 2014-06-18 DIAGNOSIS — I10 Essential (primary) hypertension: Secondary | ICD-10-CM

## 2014-06-18 DIAGNOSIS — N5201 Erectile dysfunction due to arterial insufficiency: Secondary | ICD-10-CM | POA: Diagnosis not present

## 2014-06-18 DIAGNOSIS — E785 Hyperlipidemia, unspecified: Secondary | ICD-10-CM

## 2014-06-18 DIAGNOSIS — G4733 Obstructive sleep apnea (adult) (pediatric): Secondary | ICD-10-CM | POA: Diagnosis not present

## 2014-06-18 DIAGNOSIS — Z87891 Personal history of nicotine dependence: Secondary | ICD-10-CM

## 2014-06-18 LAB — BASIC METABOLIC PANEL
BUN: 30 mg/dL — ABNORMAL HIGH (ref 6–23)
CALCIUM: 9.8 mg/dL (ref 8.4–10.5)
CHLORIDE: 103 meq/L (ref 96–112)
CO2: 26 mEq/L (ref 19–32)
CREATININE: 1.65 mg/dL — AB (ref 0.40–1.50)
GFR: 43.94 mL/min — AB (ref >60.00)
Glucose, Bld: 264 mg/dL — ABNORMAL HIGH (ref 70–99)
Potassium: 4.9 mEq/L (ref 3.5–5.1)
SODIUM: 136 meq/L (ref 135–145)

## 2014-06-18 MED ORDER — TADALAFIL 20 MG PO TABS: 10.0000 mg | ORAL_TABLET | ORAL | Status: AC | PRN

## 2014-06-18 MED ORDER — SPIRONOLACTONE 25 MG PO TABS: 25.0000 mg | ORAL_TABLET | Freq: Every day | ORAL | Status: DC

## 2014-06-18 NOTE — Assessment & Plan Note (Signed)
Continue cpap. Patient apparently needs face to face completed for insurance and will have forms sent to me. Symptoms much better controlled on cpap.

## 2014-06-18 NOTE — Progress Notes (Signed)
Paul Reddish, MD  Subjective:  Paul [name] is a 69 y.o. year old very pleasant male patient who presents with:  Hypertension-controlled  BP Readings from Last 3 Encounters:  06/18/14 122/64  06/11/14 134/90  01/22/14 136/84   Home BP monitoring-yes and similar #s Compliant with medications-yes without side effects ROS-Denies any CP, HA, SOB, blurry vision, LE edema  OSA- controlled on cpap -got CPAP in April through [facility]. Started with full mask but difficult to fit. Changed to nasal cpap and has been helpful. Going to sleep easier. Feels more rested in the daytime.  ROS- improved snoring  Past Medical History- DM II, melanoma, chronic diastolic CHF, HLD, mild aortic stenosis  Medications- reviewed and updated Current Outpatient Prescriptions  Medication Sig Dispense Refill  . ACCU-CHEK FASTCLIX LANCETS MISC Use to check blood sugar 2 times per day 200 each 2  . atorvastatin (LIPITOR) 20 MG tablet Take 1 tablet (20 mg total) by mouth daily at 6 PM. 90 tablet 0  . bumetanide (BUMEX) 1 MG tablet TAKE 1 TABLET BY MOUTH EVERY DAY 30 tablet 3  . Cholecalciferol (VITAMIN D-3) 1000 UNITS CAPS Take 2,000 Units by mouth daily.    Marland Kitchen diltiazem (CARDIZEM CD) 360 MG 24 hr capsule Take 1 capsule (360 mg total) by mouth daily with breakfast. 90 capsule 3  . hydrALAZINE (APRESOLINE) 50 MG tablet Take 50 mg by mouth 2 (two) times daily.    . Insulin Glargine (LANTUS SOLOSTAR) 100 UNIT/ML Solostar Pen Inject 140 Units into the skin every morning. And pen needles 2/day (Patient taking differently: Inject 130 Units into the skin every morning. And pen needles 2/day) 60 pen 3  . labetalol (NORMODYNE) 200 MG tablet Take 1 tablet (200 mg total) by mouth 2 (two) times daily. 90 tablet 3  . Lancet Devices (AUTOLET IMPRESSION) MISC You to check blood sugar 2 times a day 2 each 1  . Multiple Vitamins-Minerals (CENTURY SENIOR) TABS Take 1 tablet by mouth daily.    . Omega-3 Fatty Acids (FISH  OIL) 1200 MG CAPS Take 1,200 mg by mouth daily.    Marland Kitchen PARoxetine (PAXIL) 20 MG tablet Take 1 tablet (20 mg total) by mouth daily. 90 tablet 0  . spironolactone (ALDACTONE) 25 MG tablet Take 1 tablet (25 mg total) by mouth daily. 90 tablet 3  . valsartan (DIOVAN) 320 MG tablet Take 1 tablet (320 mg total) by mouth daily. 90 tablet 3   Objective: BP 122/64 mmHg  Pulse 55  Temp(Src) 98 F (36.7 C)  Wt 233 lb (105.688 kg) Gen: NAD, resting comfortably CV: RRR 2/6 SEM, no rubs or gallops Lungs: CTAB no crackles, wheeze, rhonchi Abdomen: soft/nontender/nondistended/normal bowel sounds.  Obese.  Ext: no edema Skin: warm, dry, no rash Neuro: grossly normal, moves all extremities   Assessment/Plan:  Essential hypertension Controlled on Diltiazem, hydralazine 50mg  BID, labetalol 200mg  BID, valsartan 320mg , bumex 2mg , spironolactone 25mg . Bmet without hyperkalemia- continue current rx.    Obstructive sleep apnea Continue cpap. Patient apparently needs face to face completed for insurance and will have forms sent to me. Symptoms much better controlled on cpap.    Erectile dysfunction S: continues to struggle with obtaining and maintaining erection. Held off on meds until bp controlled A/P: BP finally controlled, will restart cialis and trial 10-20mg  prn     CPE at least 3 months out with labs week prior as below  Orders Placed This Encounter  Procedures  . Basic metabolic panel  [place]  . CBC    [place]    Standing Status: Future     Number of Occurrences:      Standing Expiration Date: 06/18/2015  . Comprehensive metabolic panel    [place]    Standing Status: Future     Number of Occurrences:      Standing Expiration Date: 06/18/2015    Order Specific Question:  Has the patient fasted?    Answer:  No  . Lipid panel    [place]    Standing Status: Future     Number of Occurrences:      Standing Expiration Date: 06/18/2015    Order Specific Question:  Has the patient  fasted?    Answer:  No  . TSH        Standing Status: Future     Number of Occurrences:      Standing Expiration Date: 06/18/2015  . POCT urinalysis dipstick    In house    Standing Status: Future     Number of Occurrences:      Standing Expiration Date: 06/18/2015    Meds ordered this encounter  Medications  . spironolactone (ALDACTONE) 25 MG tablet    Sig: Take 1 tablet (25 mg total) by mouth daily.    Dispense:  90 tablet    Refill:  3  . tadalafil (CIALIS) 20 MG tablet    Sig: Take 0.5-1 tablets (10-20 mg total) by mouth every other day as needed for erectile dysfunction.    Dispense:  5 tablet    Refill:  11

## 2014-06-18 NOTE — Assessment & Plan Note (Signed)
S: continues to struggle with obtaining and maintaining erection. Held off on meds until bp controlled A/P: BP finally controlled, will restart cialis and trial 10-20mg  prn

## 2014-06-18 NOTE — Patient Instructions (Addendum)
Check with [employer] what forms they need for me to complete to continue the CPAP  Schedule the next available physical at the front desk- wait at least 3 months  Labs 1 week before. Come in with nothing but water after midnight.   BP looks good.   Check potassium today with labs.

## 2014-06-18 NOTE — Assessment & Plan Note (Signed)
Controlled on Diltiazem, hydralazine 50mg  BID, labetalol 200mg  BID, valsartan 320mg , bumex 2mg , spironolactone 25mg . Bmet without hyperkalemia- continue current rx.

## 2014-07-14 ENCOUNTER — Encounter: Payer: Self-pay | Admitting: Family Medicine

## 2014-07-15 ENCOUNTER — Other Ambulatory Visit: Payer: Self-pay

## 2014-07-15 MED ORDER — ATORVASTATIN CALCIUM 20 MG PO TABS: 20.0000 mg | ORAL_TABLET | Freq: Every day | ORAL | Status: DC

## 2014-07-15 MED ORDER — HYDRALAZINE HCL 50 MG PO TABS: 50.0000 mg | ORAL_TABLET | Freq: Two times a day (BID) | ORAL | Status: DC

## 2014-09-03 LAB — HM DIABETES EYE EXAM

## 2014-09-09 ENCOUNTER — Encounter: Payer: Self-pay | Admitting: Family Medicine

## 2014-09-10 NOTE — Telephone Encounter (Signed)
Keba let patient know I am pleased with blood pressures but HR is too low. Send in labetalol 100mg  to take BID (down from 200mg  BID) #60 with 5 refills. Have him continue to log blood pressure and HR with this change.

## 2014-09-11 ENCOUNTER — Encounter: Payer: Self-pay | Admitting: Endocrinology

## 2014-09-11 ENCOUNTER — Ambulatory Visit (INDEPENDENT_AMBULATORY_CARE_PROVIDER_SITE_OTHER): Payer: Medicare Other | Admitting: Endocrinology

## 2014-09-11 VITALS — BP 134/84 | HR 68 | Temp 97.7°F | Ht 64.0 in | Wt 232.0 lb

## 2014-09-11 DIAGNOSIS — E1121 Type 2 diabetes mellitus with diabetic nephropathy: Secondary | ICD-10-CM | POA: Diagnosis not present

## 2014-09-11 LAB — POCT GLYCOSYLATED HEMOGLOBIN (HGB A1C): Hemoglobin A1C: 7.9

## 2014-09-11 NOTE — Progress Notes (Signed)
Subjective:    Patient ID: Paul [name], male    DOB: August 13, 1945, 69 y.o.   MRN: [id number]  HPI Pt returns for f/u of diabetes mellitus: DM type: Insulin-requiring type 2 Dx'ed: 8099 Complications: nephropathy and background retinopathy.   Therapy: insulin since 2009 DKA: never Severe hypoglycemia: never.   Pancreatitis: never Other: he takes qd insulin, after poor results with taking it more frequently.   Interval history: pt states he feels well in general.  He says he does not miss any insulin doses.  Pt brings meter, which is downloaded and reviewed today.  It is scanned into the record.  cbg's vary from 95-250.  It is highest at lunch, and lower at other time of day.  Past Medical History  Diagnosis Date  . Diabetes mellitus     type 2  . Depression   . Hypertension   . Hyperlipidemia   . Microalbuminuria   . melanoma dx'd 12/2012    rt arm; surg   . Colon polyps 12/09/2009    Multiple in descending colon  . Diverticulosis 12/09/2009    Moderate    Past Surgical History  Procedure Laterality Date  . Tonsillectomy and adenoidectomy    . Mass excision  09/29/2011    [facility]-Excision sebaceous cyst on neck  . Melanoma excision  12/2012    Social History   Social History  . Marital Status: Married    Spouse Name: N/A  . Number of Children: 4  . Years of Education: N/A   Occupational History  . 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 Smoking status: Former Smoker -- 1.50 packs/day for 45 years    Types: Cigars, Cigarettes    Quit date: 09/13/1998  . Smokeless tobacco: Never Used  . Alcohol Use: 0.0 oz/week    0 Standard drinks or equivalent per week     Comment: 1-2x a month  . Drug Use: No  . Sexual Activity: No   Other Topics Concern  . Not on file   Social History Narrative   Married 4 kids. 6 grandkids.       Retired from [employer]: travel, reading    Current Outpatient Prescriptions on File Prior to Visit    Medication Sig Dispense Refill  . ACCU-CHEK FASTCLIX LANCETS MISC Use to check blood sugar 2 times per day 200 each 2  . atorvastatin (LIPITOR) 20 MG tablet Take 1 tablet (20 mg total) by mouth daily at 6 PM. 90 tablet 2  . bumetanide (BUMEX) 1 MG tablet TAKE 1 TABLET BY MOUTH EVERY DAY 30 tablet 3  . Cholecalciferol (VITAMIN D-3) 1000 UNITS CAPS Take 2,000 Units by mouth daily.    Marland Kitchen diltiazem (CARDIZEM CD) 360 MG 24 hr capsule Take 1 capsule (360 mg total) by mouth daily with breakfast. 90 capsule 3  . hydrALAZINE (APRESOLINE) 50 MG tablet Take 1 tablet (50 mg total) by mouth 2 (two) times daily. 90 tablet 2  . Insulin Glargine (LANTUS SOLOSTAR) 100 UNIT/ML Solostar Pen Inject 140 Units into the skin every morning. And pen needles 2/day (Patient taking differently: Inject 130 Units into the skin every morning. And pen needles 2/day) 60 pen 3  . labetalol (NORMODYNE) 200 MG tablet Take 1 tablet (200 mg total) by mouth 2 (two) times daily. 90 tablet 3  . Lancet Devices (AUTOLET IMPRESSION) MISC You to check blood sugar 2 times a day 2 each 1  . Multiple  Vitamins-Minerals (CENTURY SENIOR) TABS Take 1 tablet by mouth daily.    . Omega-3 Fatty Acids (FISH OIL) 1200 MG CAPS Take 1,200 mg by mouth daily.    Marland Kitchen PARoxetine (PAXIL) 20 MG tablet Take 1 tablet (20 mg total) by mouth daily. 90 tablet 0  . spironolactone (ALDACTONE) 25 MG tablet Take 1 tablet (25 mg total) by mouth daily. 90 tablet 3  . tadalafil (CIALIS) 20 MG tablet Take 0.5-1 tablets (10-20 mg total) by mouth every other day as needed for erectile dysfunction. 5 tablet 11  . valsartan (DIOVAN) 320 MG tablet Take 1 tablet (320 mg total) by mouth daily. 90 tablet 3   No current facility-administered medications on file prior to visit.    Allergies  Allergen Reactions  . Amlodipine Swelling    Family History  Problem Relation Age of Onset  . Cancer Mother     breast  . Heart attack Mother     17 from chemo  . Hypertension Father    . Cerebral aneurysm Father     BP 134/84 mmHg  Pulse 68  Temp(Src) 97.7 F (36.5 C) (Oral)  Ht 5\' 4"  (1.626 m)  Wt 232 lb (105.235 kg)  BMI 39.80 kg/m2  SpO2 94%  Review of Systems He denies hypoglycemia.      Objective:   Physical Exam VITAL SIGNS:  See vs page GENERAL: no distress Pulses: dorsalis pedis intact bilat.  MSK: no deformity of the feet CV: 1+ bilat leg edema Skin: no ulcer on the feet. normal color and temp on the feet. Neuro: sensation is intact to touch on the feet.    A1c=7.9%    Assessment & Plan:  He discussed adding low-dose humalog with breakfast, but pt says this would he hard for him to remember.    Patient is advised the following: Patient Instructions  Please continue the same insulin.   check your blood sugar twice a day.  vary the time of day when you check, between before the 3 meals, and at bedtime.  also check if you have symptoms of your blood sugar being too high or too low.  please keep a record of the readings and bring it to your next appointment here.  please call us sooner if your blood sugar goes below 70, or if you have a lot of readings over 200.    On this type of insulin schedule, you should eat meals on a regular schedule.  If a meal is missed or significantly delayed, your blood sugar could go low. Try to eat a smaller breakfast.  Please come back for a follow-up appointment in 3-4 months.

## 2014-09-11 NOTE — Patient Instructions (Addendum)
Please continue the same insulin.   check your blood sugar twice a day.  vary the time of day when you check, between before the 3 meals, and at bedtime.  also check if you have symptoms of your blood sugar being too high or too low.  please keep a record of the readings and bring it to your next appointment here.  please call us sooner if your blood sugar goes below 70, or if you have a lot of readings over 200.    On this type of insulin schedule, you should eat meals on a regular schedule.  If a meal is missed or significantly delayed, your blood sugar could go low. Try to eat a smaller breakfast.  Please come back for a follow-up appointment in 3-4 months.

## 2014-09-12 ENCOUNTER — Other Ambulatory Visit (INDEPENDENT_AMBULATORY_CARE_PROVIDER_SITE_OTHER): Payer: Medicare Other

## 2014-09-12 DIAGNOSIS — E785 Hyperlipidemia, unspecified: Secondary | ICD-10-CM | POA: Diagnosis not present

## 2014-09-12 DIAGNOSIS — I10 Essential (primary) hypertension: Secondary | ICD-10-CM

## 2014-09-12 DIAGNOSIS — Z87891 Personal history of nicotine dependence: Secondary | ICD-10-CM

## 2014-09-12 LAB — LIPID PANEL
CHOL/HDL RATIO: 4
Cholesterol: 153 mg/dL (ref 0–200)
HDL: 37 mg/dL — ABNORMAL LOW (ref >39.00)
LDL CALC: 82 mg/dL (ref 0–99)
NonHDL: 115.51
TRIGLYCERIDES: 167 mg/dL — AB (ref 0.0–149.0)
VLDL: 33.4 mg/dL (ref 0.0–40.0)

## 2014-09-12 LAB — POCT URINALYSIS DIPSTICK
Bilirubin, UA: NEGATIVE
Blood, UA: NEGATIVE
Glucose, UA: NEGATIVE
Ketones, UA: NEGATIVE
Leukocytes, UA: NEGATIVE
Nitrite, UA: NEGATIVE
Spec Grav, UA: 1.02
Urobilinogen, UA: 0.2
pH, UA: 5.5

## 2014-09-12 LAB — COMPREHENSIVE METABOLIC PANEL
ALBUMIN: 3.9 g/dL (ref 3.5–5.2)
ALK PHOS: 54 U/L (ref 39–117)
ALT: 59 U/L — AB (ref 0–53)
AST: 35 U/L (ref 0–37)
BILIRUBIN TOTAL: 0.6 mg/dL (ref 0.2–1.2)
BUN: 33 mg/dL — AB (ref 6–23)
CO2: 28 mEq/L (ref 19–32)
Calcium: 9.7 mg/dL (ref 8.4–10.5)
Chloride: 106 mEq/L (ref 96–112)
Creatinine, Ser: 1.65 mg/dL — ABNORMAL HIGH (ref 0.40–1.50)
GFR: 43.91 mL/min — ABNORMAL LOW (ref >60.00)
Glucose, Bld: 100 mg/dL — ABNORMAL HIGH (ref 70–99)
Potassium: 4.4 mEq/L (ref 3.5–5.1)
Sodium: 140 mEq/L (ref 135–145)
TOTAL PROTEIN: 7 g/dL (ref 6.0–8.3)

## 2014-09-12 LAB — CBC
HCT: 39.1 % (ref 39.0–52.0)
HEMOGLOBIN: 13.2 g/dL (ref 13.0–17.0)
MCHC: 33.7 g/dL (ref 30.0–36.0)
MCV: 93.6 fl (ref 78.0–100.0)
Platelets: 201 10*3/uL (ref 150.0–400.0)
RBC: 4.18 Mil/uL — AB (ref 4.22–5.81)
RDW: 14.1 % (ref 11.5–15.5)
WBC: 8.9 10*3/uL (ref 4.0–10.5)

## 2014-09-12 LAB — TSH: TSH: 1.93 u[IU]/mL (ref 0.35–4.50)

## 2014-09-17 IMAGING — US US RENAL
1 series · 14 of 25 positions shown · non-contrast
Comparison: None.

CLINICAL DATA: Renal cysts

RENAL/URINARY TRACT ULTRASOUND COMPLETE

[Series 1: us renal · 0.43mm/px · 14 of 41 slices shown]
[im 1/41]
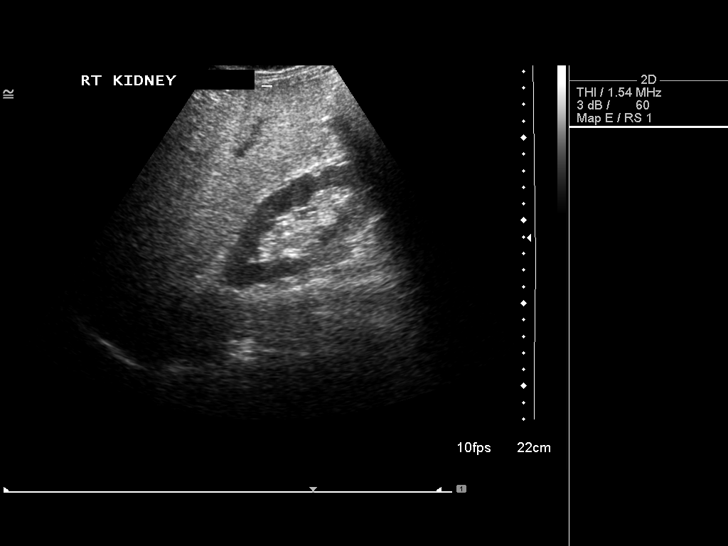
[im 4/41]
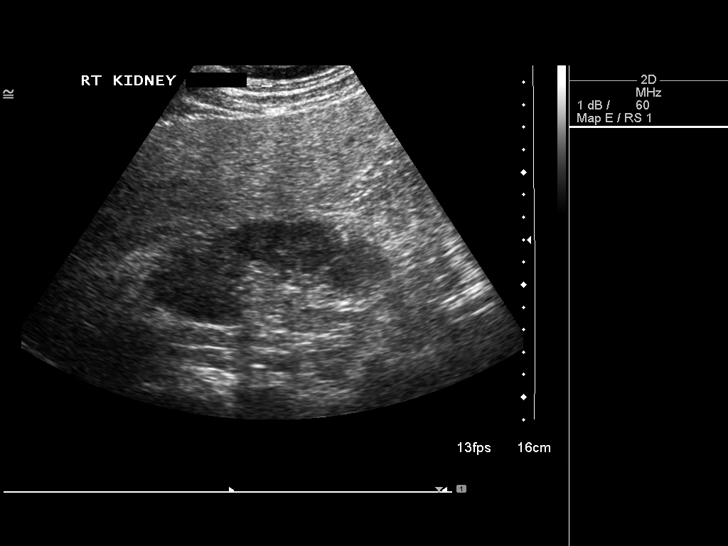
[im 7/41]
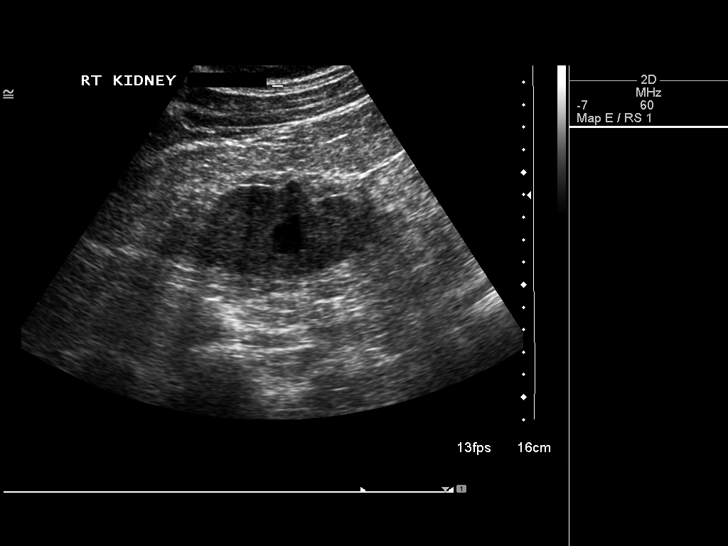
[im 11/41]
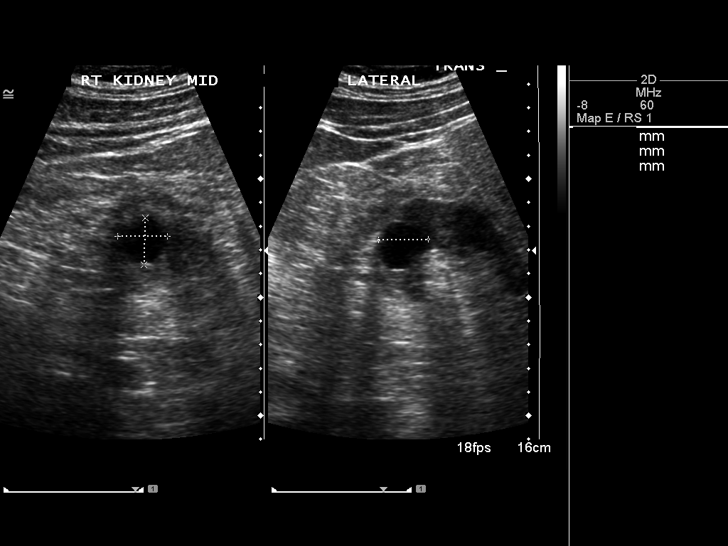
[im 14/41]
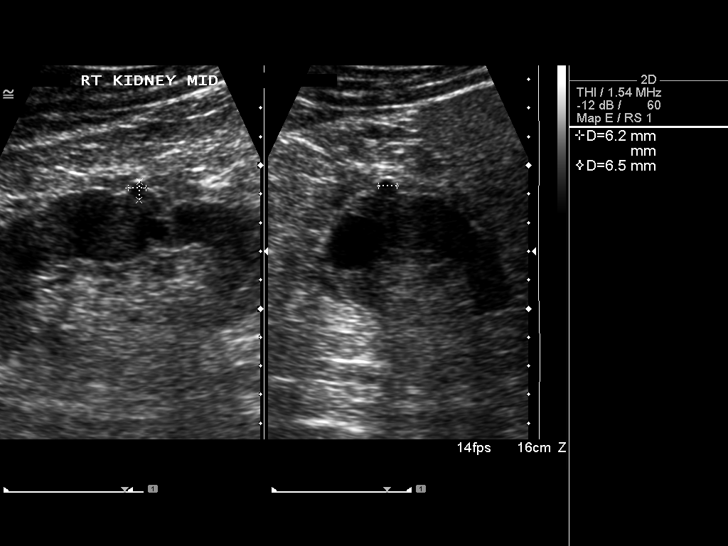
[im 16/41]
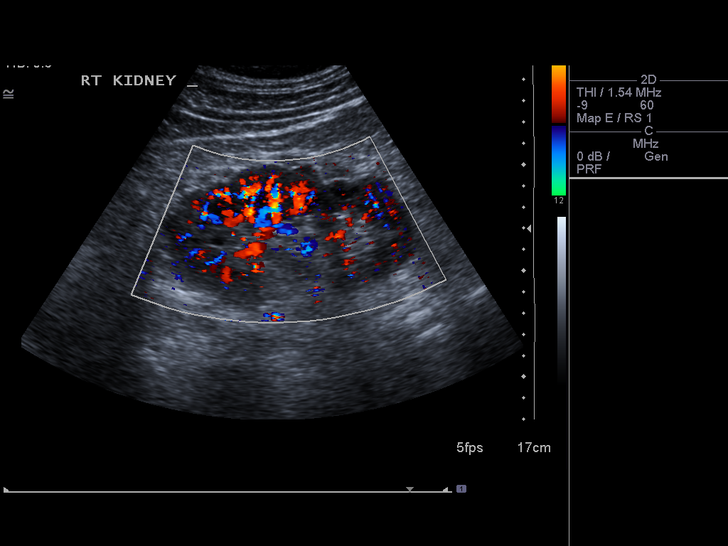
[im 19/41]
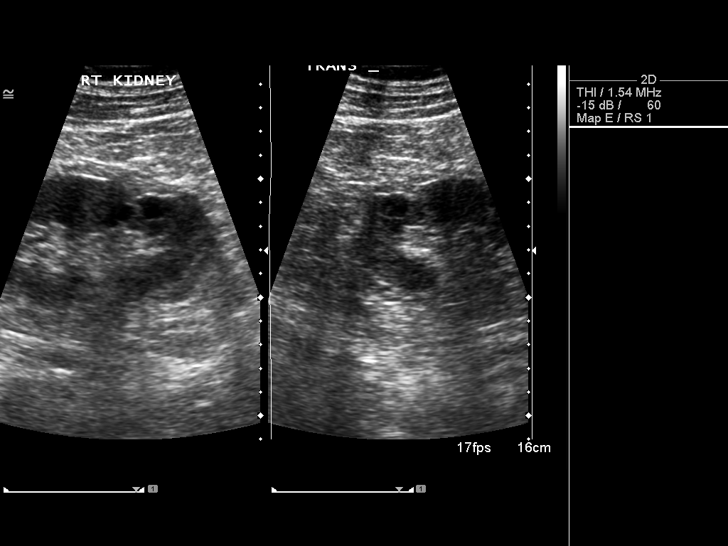
[im 22/41]
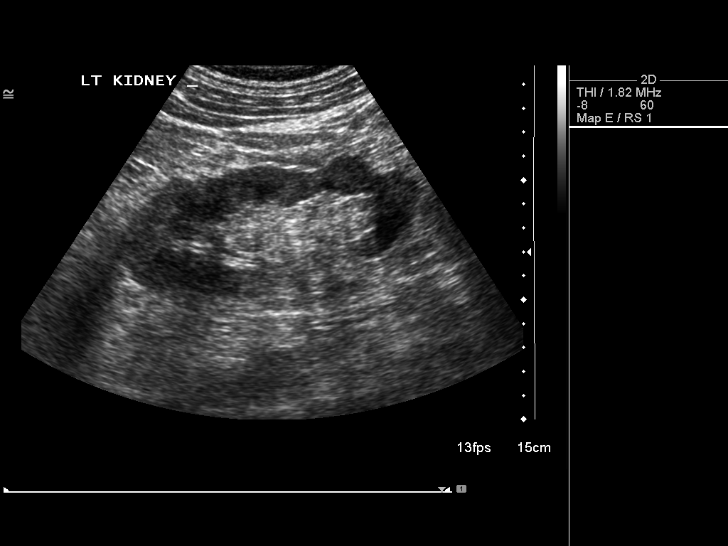
[im 26/41]
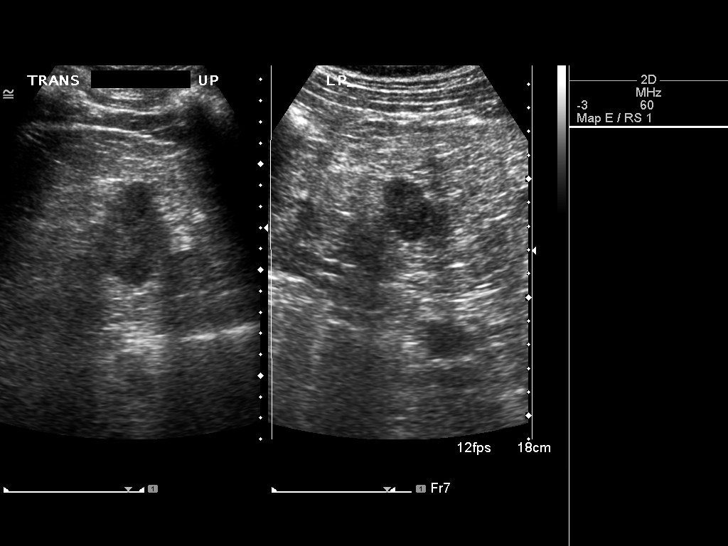
[im 27/41]
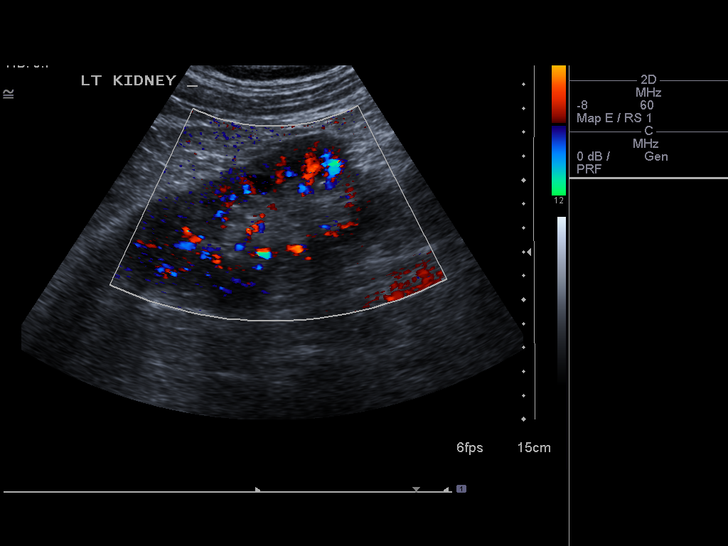
[im 31/41]
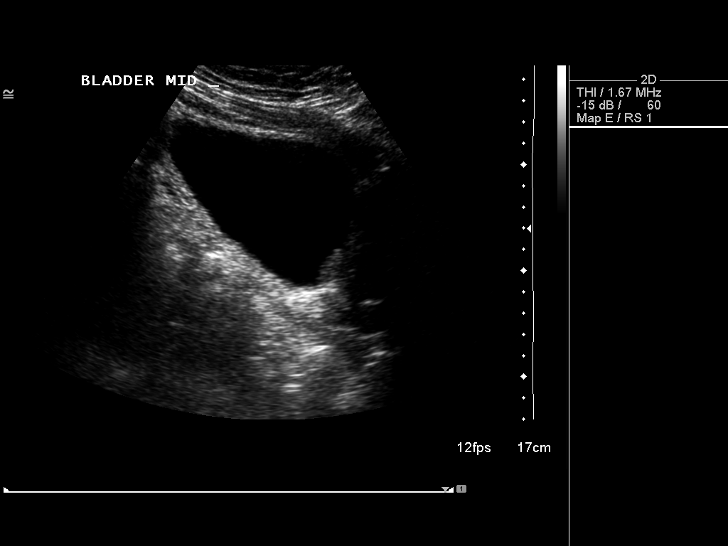
[im 34/41]
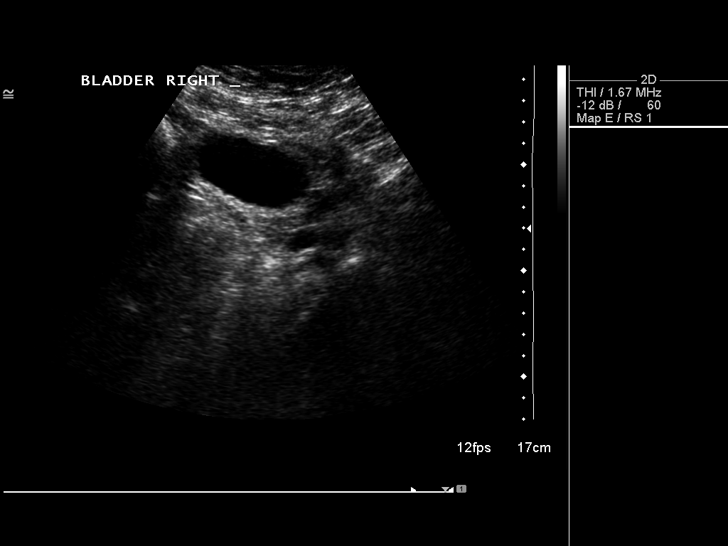
[im 37/41]
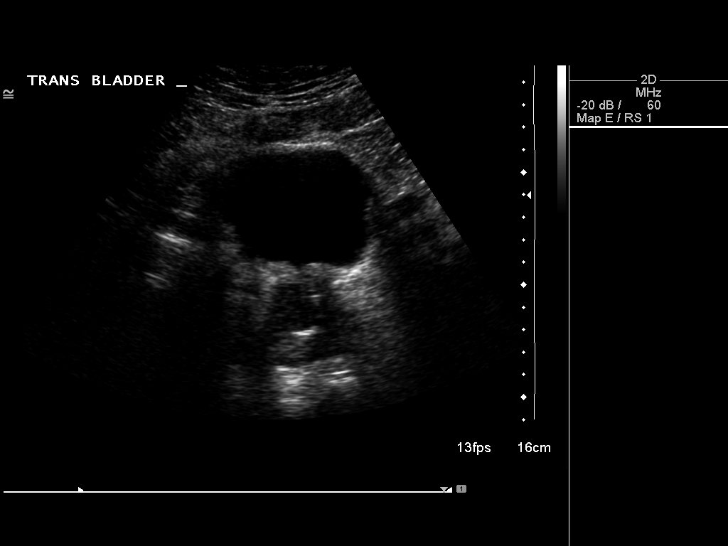
[im 41/41]
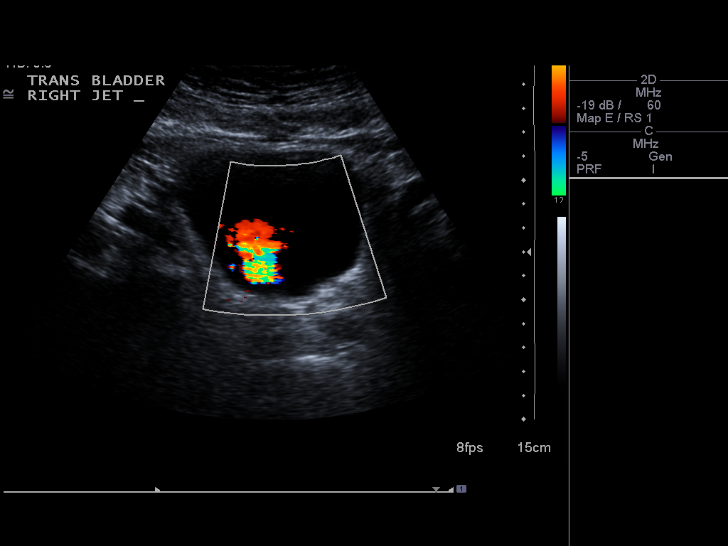

[14 of 25 positions shown; findings below may reference images not displayed]

FINDINGS: Right Kidney:  Measures 11.6 cm.  Normal in size and parenchymal
echogenicity.  No evidence of mass or hydronephrosis. Several
simple appearing cysts are present within the inferior pole of the
right kidney.  The largest measures 2.1 x 2.0 x 2.1 cm.

Left Kidney:  Measures 12.9 cm.  Normal in size and parenchymal
echogenicity.  No evidence of mass or hydronephrosis. Cyst within
the mid pole measures 1.2 x 1.0 x 1.0 cm.

Bladder:  Appears normal for degree of bladder distention.
IMPRESSION: 1.  Bilateral renal cysts.

## 2014-09-19 ENCOUNTER — Encounter: Payer: Self-pay | Admitting: Family Medicine

## 2014-09-19 ENCOUNTER — Ambulatory Visit (INDEPENDENT_AMBULATORY_CARE_PROVIDER_SITE_OTHER): Payer: Medicare Other | Admitting: Family Medicine

## 2014-09-19 VITALS — BP 120/72 | HR 69 | Temp 98.7°F | Ht 64.0 in | Wt 229.0 lb

## 2014-09-19 DIAGNOSIS — E785 Hyperlipidemia, unspecified: Secondary | ICD-10-CM

## 2014-09-19 DIAGNOSIS — N183 Chronic kidney disease, stage 3 unspecified: Secondary | ICD-10-CM

## 2014-09-19 DIAGNOSIS — I5032 Chronic diastolic (congestive) heart failure: Secondary | ICD-10-CM

## 2014-09-19 DIAGNOSIS — E1129 Type 2 diabetes mellitus with other diabetic kidney complication: Secondary | ICD-10-CM

## 2014-09-19 DIAGNOSIS — Z23 Encounter for immunization: Secondary | ICD-10-CM

## 2014-09-19 DIAGNOSIS — I10 Essential (primary) hypertension: Secondary | ICD-10-CM

## 2014-09-19 DIAGNOSIS — Z0000 Encounter for general adult medical examination without abnormal findings: Secondary | ICD-10-CM | POA: Diagnosis not present

## 2014-09-19 NOTE — Assessment & Plan Note (Signed)
Managed by Dr Ellison 

## 2014-09-19 NOTE — Assessment & Plan Note (Signed)
S: well controlled. Bumex 2mg . Weight down 3 lbs A/P:Continue current meds. We discussed exercise and need for obesity and heart health.

## 2014-09-19 NOTE — Assessment & Plan Note (Signed)
S: controlled on diltiazem, hydralazine 50mg  BID, labetalol 200mg  BID, valsartan 320mg , bumex 2mg , spironolactone 25mg  A/P:Continue current meds.

## 2014-09-19 NOTE — Patient Instructions (Addendum)
3-4 month follow up  No changes to medicine  Flu shot today  No peeing at night so did not qualify for checking PSA/prostate cancer but can in future if develops

## 2014-09-19 NOTE — Assessment & Plan Note (Signed)
S: stable. Likely diabetes related with proteinuria. Already on valsartan. Lipids controlled.  A/P:Continue current meds and BP, lipid control. Continue ARB

## 2014-09-19 NOTE — Assessment & Plan Note (Signed)
S: controlled.  Lab Results  Component Value Date   LDLCALC 82 09/12/2014  A/P:Continue current meds:  Atorvastatin 20mg 

## 2014-09-19 NOTE — Progress Notes (Signed)
Paul Reddish, MD Phone: [phone]  Subjective:  Patient presents today for their annual physical. Chief complaint-noted.   See problem oriented charting Doing very well. No edema. No chest pain, no shortness of breath with activity. Blood pressure has been controlled at home.   ROS- full  review of systems was completed and negative except for as noted (negative)  The following were reviewed and entered/updated in epic: Past Medical History  Diagnosis Date  . Diabetes mellitus     type 2  . Depression   . Hypertension   . Hyperlipidemia   . Microalbuminuria   . melanoma dx'd 12/2012    rt arm; surg   . Colon polyps 12/09/2009    Multiple in descending colon  . Diverticulosis 12/09/2009    Moderate   Patient Active Problem List   Diagnosis Date Noted  . Chronic diastolic heart failure 34/19/6222    Priority: High  . Melanoma of skin 12/18/2012    Priority: High  . Type 2 diabetes, controlled, with renal manifestation 07/28/2006    Priority: High  . Essential hypertension 07/28/2006    Priority: High  . CKD (chronic kidney disease), stage III 09/19/2014    Priority: Medium  . Obstructive sleep apnea 03/27/2014    Priority: Medium  . Former smoker 01/02/2014    Priority: Medium  . Mild aortic stenosis 11/26/2013    Priority: Medium  . Hyperlipidemia 07/28/2006    Priority: Medium  . Depression 07/28/2006    Priority: Medium  . Diverticulosis of colon with hemorrhage 08/10/2013    Priority: Low  . Hx of adenomatous colonic polyps 08/10/2013    Priority: Low  . Erectile dysfunction 09/04/2009    Priority: Low  . Obesity 12/28/2007    Priority: Low  . Gout 06/28/2007    Priority: Low  . Anemia 12/27/2006    Priority: Low  . MICROALBUMINURIA 07/28/2006    Priority: Low   Past Surgical History  Procedure Laterality Date  . Tonsillectomy and adenoidectomy    . Mass excision  09/29/2011    [facility]-Excision sebaceous cyst on neck  . Melanoma excision   12/2012    Family History  Problem Relation Age of Onset  . Cancer Mother     breast  . Heart attack Mother     40 from chemo  . Hypertension Father   . Cerebral aneurysm Father     Medications- reviewed and updated Current Outpatient Prescriptions  Medication Sig Dispense Refill  . ACCU-CHEK FASTCLIX LANCETS MISC Use to check blood sugar 2 times per day 200 each 2  . atorvastatin (LIPITOR) 20 MG tablet Take 1 tablet (20 mg total) by mouth daily at 6 PM. 90 tablet 2  . bumetanide (BUMEX) 1 MG tablet TAKE 1 TABLET BY MOUTH EVERY DAY 30 tablet 3  . Cholecalciferol (VITAMIN D-3) 1000 UNITS CAPS Take 2,000 Units by mouth daily.    Marland Kitchen diltiazem (CARDIZEM CD) 360 MG 24 hr capsule Take 1 capsule (360 mg total) by mouth daily with breakfast. 90 capsule 3  . hydrALAZINE (APRESOLINE) 50 MG tablet Take 1 tablet (50 mg total) by mouth 2 (two) times daily. 90 tablet 2  . Insulin Glargine (LANTUS SOLOSTAR) 100 UNIT/ML Solostar Pen Inject 140 Units into the skin every morning. And pen needles 2/day (Patient taking differently: Inject 130 Units into the skin every morning. And pen needles 2/day) 60 pen 3  . labetalol (NORMODYNE) 200 MG tablet Take 1 tablet (200 mg total) by mouth 2 (two)  times daily. 90 tablet 3  . Lancet Devices (AUTOLET IMPRESSION) MISC You to check blood sugar 2 times a day 2 each 1  . Multiple Vitamins-Minerals (CENTURY SENIOR) TABS Take 1 tablet by mouth daily.    . Omega-3 Fatty Acids (FISH OIL) 1200 MG CAPS Take 1,200 mg by mouth daily.    Marland Kitchen PARoxetine (PAXIL) 20 MG tablet Take 1 tablet (20 mg total) by mouth daily. 90 tablet 0  . spironolactone (ALDACTONE) 25 MG tablet Take 1 tablet (25 mg total) by mouth daily. 90 tablet 3  . valsartan (DIOVAN) 320 MG tablet Take 1 tablet (320 mg total) by mouth daily. 90 tablet 3  . tadalafil (CIALIS) 20 MG tablet Take 0.5-1 tablets (10-20 mg total) by mouth every other day as needed for erectile dysfunction. (Patient not taking: Reported on  09/19/2014) 5 tablet 11   No current facility-administered medications for this visit.    Allergies-reviewed and updated Allergies  Allergen Reactions  . Amlodipine Swelling    Social History   Social History  . Marital Status: Married    Spouse Name: N/A  . Number of Children: 4  . Years of Education: N/A   Occupational History  . 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 Smoking status: Former Smoker -- 1.50 packs/day for 45 years    Types: Cigars, Cigarettes    Quit date: 09/13/1998  . Smokeless tobacco: Never Used  . Alcohol Use: 0.0 oz/week    0 Standard drinks or equivalent per week     Comment: 1-2x a month  . Drug Use: No  . Sexual Activity: No   Other Topics Concern  . Not on file   Social History Narrative   Married 4 kids. 6 grandkids.       Retired from [employer]: travel, reading    ROS--See HPI   Objective: BP 120/72 mmHg  Pulse 69  Temp(Src) 98.7 F (37.1 C)  Ht 5\' 4"  (1.626 m)  Wt 229 lb (103.874 kg)  BMI 39.29 kg/m2 Gen: NAD, resting comfortably HEENT: Mucous membranes are moist. Oropharynx normal Neck: no thyromegaly CV: 3/6 SEM best heard RUSB. no murmurs rubs or gallops Lungs: CTAB no crackles, wheeze, rhonchi Abdomen: soft/nontender/nondistended/normal bowel sounds. No rebound or guarding. Obese.  Declines rectal without any BPH symptoms Ext: no edema, 2+ PT pulses Skin: warm, dry Neuro: grossly normal, moves all extremities, PERRLA  Assessment/Plan:  69 y.o. male presenting for annual physical.  Health Maintenance counseling: 1. Anticipatory guidance: Patient counseled regarding regular dental exams, wearing seatbelts, wearing sunscreen, yearly eye exam 2. Risk factor reduction:  Advised patient of need for regular exercise and diet rich and fruits and vegetables to reduce risk of heart attack and stroke. Has gym membership- advised 150 minutes a week/weight loss.  3.  Immunizations/screenings/ancillary studies Health Maintenance Due  Topic Date Due  . Hepatitis C Screening - Screened through [employer] [facility] 1945/11/12  . INFLUENZA VACCINE - shot today 08/19/2014  4. Prostate cancer screening- no nocturia, does not qualify. Other health risks/comorbidities 5. Colon cancer screening - 09/26/2013 with 5 year follow 6. Skin cancer screening- yearly exams with derm  Essential hypertension S: controlled on diltiazem, hydralazine 50mg  BID, labetalol 200mg  BID, valsartan 320mg , bumex 2mg , spironolactone 25mg  A/P:Continue current meds.    Chronic diastolic heart failure S: well controlled. Bumex 2mg . Weight down 3 lbs A/P:Continue current meds. We discussed exercise and need for obesity and heart health.  Hyperlipidemia S: controlled.  Lab Results  Component Value Date   LDLCALC 82 09/12/2014  A/P:Continue current meds:  Atorvastatin 20mg    Type 2 diabetes, controlled, with renal manifestation Managed by Dr. Loanne Drilling  CKD (chronic kidney disease), stage III S: stable. Likely diabetes related with proteinuria. Already on valsartan. Lipids controlled.  A/P:Continue current meds and BP, lipid control. Continue ARB    Return precautions advised.   Orders Placed This Encounter  Procedures  . Flu Vaccine QUAD 36+ mos IM

## 2014-10-02 ENCOUNTER — Other Ambulatory Visit: Payer: Self-pay | Admitting: Family Medicine

## 2014-10-02 MED ORDER — PAROXETINE HCL 20 MG PO TABS: 20.0000 mg | ORAL_TABLET | Freq: Every day | ORAL | Status: DC

## 2014-11-09 ENCOUNTER — Other Ambulatory Visit: Payer: Self-pay | Admitting: Family Medicine

## 2014-11-11 ENCOUNTER — Other Ambulatory Visit: Payer: Self-pay | Admitting: *Deleted

## 2014-11-11 MED ORDER — BUMETANIDE 1 MG PO TABS: 1.0000 mg | ORAL_TABLET | Freq: Every day | ORAL | Status: DC

## 2014-11-13 ENCOUNTER — Encounter: Payer: Self-pay | Admitting: Family Medicine

## 2014-11-18 ENCOUNTER — Ambulatory Visit (INDEPENDENT_AMBULATORY_CARE_PROVIDER_SITE_OTHER): Payer: Medicare Other | Admitting: Family Medicine

## 2014-11-18 ENCOUNTER — Encounter: Payer: Self-pay | Admitting: Family Medicine

## 2014-11-18 VITALS — BP 118/68 | HR 78 | Temp 98.7°F | Wt 230.0 lb

## 2014-11-18 DIAGNOSIS — Z8481 Family history of carrier of genetic disease: Secondary | ICD-10-CM | POA: Diagnosis not present

## 2014-11-18 NOTE — Progress Notes (Signed)
Patient presents for counseling and genetic testing. He has 2 cousins (mother's sister's daughters) that have had breast cancer. Most recently cousin had genetic testing and was positive for BRCA2. Patients Mother had breast cancer but was never tested genetically. Mother now passed from breast cancer 57. Maternal Uncle died of cancer -unclear cause. [place] ancestry. <1% jewish on some genetic testing in past.   We discussed considering patient is age 69- that the likelihood of this affecting him is low though he does carry increased risk of prostate and male breast cancer and we may be more aggressive screening for these if positive. Bigger issue for patient is he has children that may benefit from knowing. If he is negative, this will provide reassurance for him personally as well as for family members. We opted to proceed forward with BRCA2 testing today. Discussed send out lab and unclear how long will take to get back but he will ask for status update in 1 month by mychart if we have not heard back.   >50% of 15 minute office visit was spent on counseling in regards to genetic testing and coordination of care

## 2014-11-18 NOTE — Patient Instructions (Signed)
Genetic testing today for BRCA2. We will call you with results. If you dont hear within a month, at least call for status update 

## 2014-11-19 ENCOUNTER — Telehealth: Payer: Self-pay | Admitting: Family Medicine

## 2014-11-19 NOTE — Telephone Encounter (Signed)
See below

## 2014-11-19 NOTE — Telephone Encounter (Signed)
[facility] needs a call back for clarification on pt's BRCA order. They say this test is usually for pt's who have a history of someone in immediate family, [name] at [facility] would like a call back toclarify this order thanks

## 2014-11-19 NOTE — Telephone Encounter (Signed)
Patient's mother died of breast cancer and was never tested genetically. The genetic advisor patient was seeing advised that patient be tested as could have been passed to him from mother. Cousin that has breast cancer x2 was mother's sisters child.   Please let them know above information

## 2014-11-20 NOTE — Telephone Encounter (Signed)
Paul [name], i tried calling [name] from [facility] back but the number you have listed below is a non working number. Do you know which [facility] she was calling from?

## 2014-11-26 NOTE — Telephone Encounter (Signed)
ordered clarified

## 2014-11-27 ENCOUNTER — Encounter: Payer: Self-pay | Admitting: Family Medicine

## 2014-11-27 ENCOUNTER — Other Ambulatory Visit: Payer: Self-pay

## 2014-11-29 ENCOUNTER — Telehealth: Payer: Self-pay | Admitting: Family Medicine

## 2014-11-29 NOTE — Telephone Encounter (Signed)
[facility] called back to advise they did find abnormality in the genetic testing of a cousin. In order to proceed with brca , they need a copy of the cousin's report.

## 2014-12-02 NOTE — Telephone Encounter (Signed)
Papers refaxed to [place].

## 2014-12-19 ENCOUNTER — Other Ambulatory Visit: Payer: Self-pay | Admitting: Family Medicine

## 2014-12-19 ENCOUNTER — Encounter: Payer: Self-pay | Admitting: Family Medicine

## 2014-12-20 ENCOUNTER — Other Ambulatory Visit: Payer: Self-pay

## 2014-12-20 MED ORDER — BUMETANIDE 2 MG PO TABS: 2.0000 mg | ORAL_TABLET | Freq: Every day | ORAL | Status: DC

## 2014-12-23 ENCOUNTER — Encounter: Payer: Self-pay | Admitting: Family Medicine

## 2014-12-26 ENCOUNTER — Encounter: Payer: Self-pay | Admitting: Family Medicine

## 2014-12-26 ENCOUNTER — Encounter: Payer: Medicare Other | Admitting: Family Medicine

## 2014-12-26 NOTE — Progress Notes (Signed)
Did not have lab results available we were going to discuss. Reach out to Paul [name].   This encounter was created in error - please disregard.

## 2014-12-26 NOTE — Patient Instructions (Signed)
I will check with lab tomorrow and we will get update on lab send out.   I will give you a call about results. LIkely we can cover everything by phone so if told to schedule visit-lets talk by phone first.

## 2014-12-30 ENCOUNTER — Other Ambulatory Visit: Payer: Self-pay | Admitting: Family Medicine

## 2015-01-01 ENCOUNTER — Ambulatory Visit (INDEPENDENT_AMBULATORY_CARE_PROVIDER_SITE_OTHER): Payer: Medicare Other | Admitting: Endocrinology

## 2015-01-01 ENCOUNTER — Encounter: Payer: Self-pay | Admitting: Endocrinology

## 2015-01-01 VITALS — BP 140/87 | HR 66 | Temp 98.1°F | Ht 64.0 in | Wt 236.0 lb

## 2015-01-01 DIAGNOSIS — E1121 Type 2 diabetes mellitus with diabetic nephropathy: Secondary | ICD-10-CM | POA: Diagnosis not present

## 2015-01-01 LAB — POCT GLYCOSYLATED HEMOGLOBIN (HGB A1C): HEMOGLOBIN A1C: 7.9

## 2015-01-01 MED ORDER — INSULIN GLARGINE 100 UNIT/ML SOLOSTAR PEN: 130.0000 [IU] | PEN_INJECTOR | SUBCUTANEOUS | Status: DC

## 2015-01-01 NOTE — Progress Notes (Signed)
Subjective:    Patient ID: Paul [name], male    DOB: Apr 24, 1945, 69 y.o.   MRN: [id number]  HPI Pt returns for f/u of diabetes mellitus: DM type: Insulin-requiring type 2 Dx'ed: [id number] Complications: renal insufficiency and background retinopathy.   Therapy: insulin since 2009 DKA: never Severe hypoglycemia: never.   Pancreatitis: never Other: he takes qd insulin, after poor results with taking it more frequently; he declines weight loss surgery.   Interval history: pt states he feels well in general.  He says he does not miss any insulin doses.  no cbg record, but states cbg's are well-controlled.  Past Medical History  Diagnosis Date  . Diabetes mellitus     type 2  . Depression   . Hypertension   . Hyperlipidemia   . Microalbuminuria   . melanoma dx'd 12/2012    rt arm; surg   . Colon polyps 12/09/2009    Multiple in descending colon  . Diverticulosis 12/09/2009    Moderate    Past Surgical History  Procedure Laterality Date  . Tonsillectomy and adenoidectomy    . Mass excision  09/29/2011    [facility]-Excision sebaceous cyst on neck  . Melanoma excision  12/2012    Social History   Social History  . Marital Status: Married    Spouse Name: N/A  . Number of Children: 4  . Years of Education: N/A   Occupational History  . 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 Smoking status: Former Smoker -- 1.50 packs/day for 45 years    Types: Cigars, Cigarettes    Quit date: 09/13/1998  . Smokeless tobacco: Never Used  . Alcohol Use: 0.0 oz/week    0 Standard drinks or equivalent per week     Comment: 1-2x a month  . Drug Use: No  . Sexual Activity: No   Other Topics Concern  . Not on file   Social History Narrative   Married 4 kids. 6 grandkids.       Retired from [employer]: travel, reading    Current Outpatient Prescriptions on File Prior to Visit  Medication Sig Dispense Refill  . ACCU-CHEK FASTCLIX LANCETS MISC Use to  check blood sugar 2 times per day 200 each 2  . atorvastatin (LIPITOR) 20 MG tablet Take 1 tablet (20 mg total) by mouth daily at 6 PM. 90 tablet 2  . bumetanide (BUMEX) 2 MG tablet Take 1 tablet (2 mg total) by mouth daily. 90 tablet 3  . Cholecalciferol (VITAMIN D-3) 1000 UNITS CAPS Take 2,000 Units by mouth daily.    Marland Kitchen diltiazem (CARDIZEM CD) 360 MG 24 hr capsule Take 1 capsule by mouth  daily with breakfast 90 capsule 2  . hydrALAZINE (APRESOLINE) 50 MG tablet Take 1 tablet by mouth two  times daily 180 tablet 3  . labetalol (NORMODYNE) 200 MG tablet Take 1 tablet by mouth two  times daily 180 tablet 3  . Lancet Devices (AUTOLET IMPRESSION) MISC You to check blood sugar 2 times a day 2 each 1  . Multiple Vitamins-Minerals (CENTURY SENIOR) TABS Take 1 tablet by mouth daily.    . Omega-3 Fatty Acids (FISH OIL) 1200 MG CAPS Take 1,200 mg by mouth daily.    Marland Kitchen PARoxetine (PAXIL) 20 MG tablet Take 1 tablet (20 mg total) by mouth daily. 90 tablet 3  . spironolactone (ALDACTONE) 25 MG tablet Take 1 tablet (25 mg total) by mouth daily. 90 tablet  3  . tadalafil (CIALIS) 20 MG tablet Take 0.5-1 tablets (10-20 mg total) by mouth every other day as needed for erectile dysfunction. 5 tablet 11  . valsartan (DIOVAN) 320 MG tablet Take 1 tablet by mouth  daily 90 tablet 2   No current facility-administered medications on file prior to visit.    Allergies  Allergen Reactions  . Amlodipine Swelling    Family History  Problem Relation Age of Onset  . Cancer Mother     breast  . Heart attack Mother     2 from chemo  . Hypertension Father   . Cerebral aneurysm Father     BP 140/87 mmHg  Pulse 66  Temp(Src) 98.1 F (36.7 C) (Oral)  Ht 5\' 4"  (1.626 m)  Wt 236 lb (107.049 kg)  BMI 40.49 kg/m2  SpO2 92%   Review of Systems He denies hypoglycemia    Objective:   Physical Exam VITAL SIGNS:  See vs page GENERAL: no distress Pulses: dorsalis pedis intact bilat.   MSK: no deformity of the  feet  CV: 1+ bilat leg edema  Skin: no ulcer on the feet. normal color and temp on the feet.  Neuro: sensation is intact to touch on the feet.   A1c=7.9%    Assessment & Plan:  DM: this is the best control this pt should aim for, given this regimen, which does match insulin to his changing needs throughout the day  Patient is advised the following: Patient Instructions  Please continue the same insulin.   check your blood sugar twice a day.  vary the time of day when you check, between before the 3 meals, and at bedtime.  also check if you have symptoms of your blood sugar being too high or too low.  please keep a record of the readings and bring it to your next appointment here.  please call us sooner if your blood sugar goes below 70, or if you have a lot of readings over 200.    On this type of insulin schedule, you should eat meals on a regular schedule.  If a meal is missed or significantly delayed, your blood sugar could go low. Please come back for a follow-up appointment in 3-4 months.

## 2015-01-01 NOTE — Patient Instructions (Addendum)
Please continue the same insulin.   check your blood sugar twice a day.  vary the time of day when you check, between before the 3 meals, and at bedtime.  also check if you have symptoms of your blood sugar being too high or too low.  please keep a record of the readings and bring it to your next appointment here.  please call us sooner if your blood sugar goes below 70, or if you have a lot of readings over 200.    On this type of insulin schedule, you should eat meals on a regular schedule.  If a meal is missed or significantly delayed, your blood sugar could go low. Please come back for a follow-up appointment in 3-4 months.

## 2015-01-02 ENCOUNTER — Telehealth: Payer: Self-pay | Admitting: Family Medicine

## 2015-01-02 ENCOUNTER — Encounter: Payer: Self-pay | Admitting: Endocrinology

## 2015-01-02 ENCOUNTER — Encounter: Payer: Self-pay | Admitting: Family Medicine

## 2015-01-02 DIAGNOSIS — Z1501 Genetic susceptibility to malignant neoplasm of breast: Secondary | ICD-10-CM | POA: Insufficient documentation

## 2015-01-02 DIAGNOSIS — Z1509 Genetic susceptibility to other malignant neoplasm: Principal | ICD-10-CM

## 2015-01-02 NOTE — Telephone Encounter (Signed)
brca 2 positive heterozygote Refer to genetic counseling- Bevelyn Ngo I sent this to [facility] Laming but please make sure this is right order for genetic counseling- only option was genetics.  Discussed increased risk prostate cancer, melanoma (which he has had), breast cancer

## 2015-01-03 ENCOUNTER — Ambulatory Visit: Payer: Medicare Other | Admitting: Endocrinology

## 2015-01-07 ENCOUNTER — Telehealth: Payer: Self-pay | Admitting: Genetic Counselor

## 2015-01-07 NOTE — Telephone Encounter (Signed)
Lt mess regarding genetic counseling referral °

## 2015-01-08 ENCOUNTER — Telehealth: Payer: Self-pay | Admitting: Genetic Counselor

## 2015-01-08 NOTE — Telephone Encounter (Signed)
PT CONFIRMED GENETIC COUNSELING APPT

## 2015-01-27 ENCOUNTER — Ambulatory Visit: Payer: Medicare Other | Admitting: Family Medicine

## 2015-02-06 ENCOUNTER — Other Ambulatory Visit: Payer: Medicare Other

## 2015-02-06 ENCOUNTER — Ambulatory Visit (HOSPITAL_BASED_OUTPATIENT_CLINIC_OR_DEPARTMENT_OTHER): Payer: Medicare Other | Admitting: Genetic Counselor

## 2015-02-06 ENCOUNTER — Other Ambulatory Visit: Payer: Self-pay | Admitting: Family Medicine

## 2015-02-06 ENCOUNTER — Encounter: Payer: Self-pay | Admitting: Genetic Counselor

## 2015-02-06 DIAGNOSIS — Z1379 Encounter for other screening for genetic and chromosomal anomalies: Secondary | ICD-10-CM

## 2015-02-06 DIAGNOSIS — Z85820 Personal history of malignant melanoma of skin: Secondary | ICD-10-CM | POA: Diagnosis not present

## 2015-02-06 DIAGNOSIS — Z1509 Genetic susceptibility to other malignant neoplasm: Principal | ICD-10-CM

## 2015-02-06 DIAGNOSIS — Z315 Encounter for genetic counseling: Secondary | ICD-10-CM

## 2015-02-06 DIAGNOSIS — Z800 Family history of malignant neoplasm of digestive organs: Secondary | ICD-10-CM | POA: Diagnosis not present

## 2015-02-06 DIAGNOSIS — Z1501 Genetic susceptibility to malignant neoplasm of breast: Secondary | ICD-10-CM

## 2015-02-06 DIAGNOSIS — C439 Malignant melanoma of skin, unspecified: Secondary | ICD-10-CM

## 2015-02-06 DIAGNOSIS — Z803 Family history of malignant neoplasm of breast: Secondary | ICD-10-CM | POA: Diagnosis not present

## 2015-02-06 NOTE — Progress Notes (Signed)
GENETIC TEST RESULTS   REFERRING PROVIDER: Marin Olp, MD [address] [place], [place] [address]  PRIMARY PROVIDER:  Garret Reddish, MD  PRIMARY REASON FOR VISIT:  1. BRCA2 positive   2. Genetic testing   3. 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 Melanoma of skin ([place])   5. Family history of breast cancer in mother   35. Family history of stomach cancer      HISTORY OF PRESENT ILLNESS:   Paul [name] was referred to the [place] [facility] by Dr. Yong Channel for follow-up regarding his positive genetic test result for a pathogenic BRCA2 mutation, which was first identified in another family member.  He is here today to discuss the implications of this result, including recommendations for future medical management,   In 2014, at the age of 70, Paul [name] was diagnosed with melanoma which was treated at [facility]. This was surgically removed and Paul [name] has had no additional issues.  He has no further history of cancer.    CANCER HISTORY:  Hx of melanoma, dx in 2014 at age 25; treated at [facility]:  Colonoscopy: yes; most recent was in 09/2013 - one tubular adenoma found in descending colon; 11/2009: 4 tubular adenomas in descending colon; colonoscopy in 01/2005: (no results available for viewing at this time); Paul [name] believes that maybe he has only had about 5 polyps total; he is will have another colonoscopy in 2020. Up to date with prostate exams:  yes. Any excessive radiation exposure in the past:  no, but reports some additional history of secondhand smoke (previously worked at [employer])  Past Medical History  Diagnosis Date  . Diabetes mellitus     type 2  . Depression   . Hypertension   . Hyperlipidemia   . Microalbuminuria   . melanoma dx'd 12/2012    rt arm; surg   . Colon polyps 12/09/2009    Multiple in descending colon  . Diverticulosis 12/09/2009    Moderate    Past Surgical History  Procedure Laterality Date  .  Tonsillectomy and adenoidectomy    . Mass excision  09/29/2011    [facility]-Excision sebaceous cyst on neck  . Melanoma excision  12/2012    Social History   Social History  . Marital Status: Married    Spouse Name: N/A  . Number of Children: 4  . Years of Education: N/A   Occupational History  . 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 Smoking status: Former Smoker -- 1.50 packs/day for 45 years    Types: Cigars, Cigarettes    Quit date: 09/13/1998  . Smokeless tobacco: Never Used  . Alcohol Use: 0.0 oz/week    0 Standard drinks or equivalent per week     Comment: very little/occ  . Drug Use: No  . Sexual Activity: No   Other Topics Concern  . None   Social History Narrative   Married 4 kids. 6 grandkids.       Retired from [employer]: travel, reading     FAMILY HISTORY:  Because we did not see Paul [name] prior to his having genetic testing, we obtained a detailed, 4-generation family history at this follow-up session.  Significant diagnoses are listed below: Family History  Problem Relation Age of Onset  . Heart attack Mother     60 from chemo  . Breast cancer Mother     dx. <68; +chemo  .  Hypertension Father   . Cerebral aneurysm Father   . BRCA 1/2 Maternal Aunt     never tested, but daughter has known mutation in BRCA2  . Kidney failure Maternal Aunt   . Stomach cancer Maternal Uncle     d. 45; smoker and issues with heartburn  . Heart Problems Paternal Grandfather   . BRCA 1/2 Cousin     BRCA2+; maternal 1st cousin; first person tested in family  . Breast cancer Cousin 59    [name] has three daughters and one son between the ages of 26-39.  He has six grandchildren currently.  [name] mother had a history of breast cancer which was diagnosed sometime before age 61.  She received chemotherapy in treatment for that breast cancer and passed away at 4 following a heart attack.  [name] father died at age 30 of a  cerebral aneurysm due to acute hypertension.   The known BRCA2 mutation, "c.2830A>T ([id number])" was first identified in [name] maternal first cousin, the daughter of [name]'s sister.  [name] mother had two full sisters and one full brother.  Her brother died of stomach cancer at 71.  One sister died during childbirth in her 43s-30s.  The other sister died of kidney failure in her 32s.  This latter sister is the mother of [name] cousin who is BRCA2 mutation positive, so we would assume that she was also a carrier of this BRCA2 mutation.  This aunt had one other daughter (a fraternal twin to the aforementioned daughter) and a son, and [name] is not aware whether or not they have had genetic testing yet.  The cousin who tested positive has one son and daughter of her own.  [name] maternal grandmother died of nephritis when his mother was approximately 7 years old.  His maternal grandfather died from an unspecified cause when his mother was approximately 16-37 years old.  He has no further information for any maternal great aunts/uncles or great grandparents.  [name] is unaware of any cancer diagnoses on the paternal side of the family.  [name] father had two full sisters, both of whom passed away around the age of 77.  [name] paternal grandmother died at a "younger age" and his grandfather died of a heart attack or of congestive heart failure at the age of 32.  He has no further information for any paternal great aunts/uncles or great grandparents.  Patient's maternal ancestors are of [place] descent, and paternal ancestors are of [place] descent. There is no reported Ashkenazi Jewish ancestry, save for a "<1% Jewish ancestry" reported via the [place] DNA test. There is no known consanguinity.  GENETIC TESTING: Based on the known familial history of a heterozygous pathogenic BRCA2 mutation called "c.2830A>T (M.[id number])",  BRCA2  targeted analysis was ordered through Mr. Sellitto provider at [facility] in October of 2016.  Testing, which was performed at [facility] in [place], [place], revealed the familial mutation "c.2830A>T [phone])" in one copy of the BRCA2 gene.  Date of test report is December 17, 2014.  MEDICAL MANAGEMENT: Men who have a BRCA2 mutation have an increased risk for prostate and male breast cancer.  They also have a somewhat increased risk for melanoma and pancreatic cancers.  The [employer]  (NCCN) publishes guidelines for BRCA Mutation-Positive Management.  For men with BRCA mutations, the NCCN recommends the following per specific cancer risk:   Breast Cancer Breast self-exam training and  education starting at age 12y (self exams should be performed routinely, perhaps monthly) Clinical breast exam, every 12 months, starting at age 35y  (No recommendations are currently made in regards to mammogram screening, but mammogram screening may be appropriate in some circumstances, especially following breast changes)  Clinical breast exams may be performed routinely by Mr. Baik primary care provider, but a referral to the [place] [facility] for an initial breast exam is appropriate and encouraged.  Prostate Cancer Recommend prostate cancer screening starting at age 87y  Melanoma Currently, no screening guidelines exist for melanoma, but screening may be individualized based on cancers observed in the family  Since, Mr. Carreras has a personal history of melanoma, it is appropriate for him to continue dermatology screening every 6-12 months or more frequently based on the recommendations of his dermatologist  Pancreatic Cancer Currently, no screening guidelines exist for pancreatic cancer, but screening may be individualized based on cancers observed in the family  Since there is no personal or family history of pancreatic cancer,  it is unlikely that Mr. Okray should have pancreatic cancer screening at this time; however, if a family history of pancreatic cancer becomes apparent, then Mr. Maceachern may meet criteria for referral to [employer]'s pancreatic cancer screening study.  Other Cancers Some research studies suggest that BRCA2 mutations may somewhat increase risks for certain other types of cancer such as gall bladder, stomach, etc.  At this time, studies are conflicting and the significance of these potential risks have yet to be determined.  As screening guidelines change in the future we will try to reach out to Mr. Chisolm with any updates.  We also encourage him to reach out to [place] in a year or two to update the family history and to find out about any cancer screening updates.  FAMILY MEMBERS: It is important that all of Mr. Dansby relatives (both men and women) know of the presence of this gene mutation.  Women with BRCA2 mutations have a high risk for breast and ovarian cancer as well as a somewhat increased risk for pancreatic and melanoma cancers. Site-specific genetic testing can sort out who in the family is at risk and who is not.   Mr. Mistry is an only child, but his children have a 50% chance to have inherited this mutation. We recommend they have genetic testing for this same mutation, as identifying the presence of this mutation would allow them to also take advantage of risk-reducing measures.  One daughter lives in [place], [place], so we discussed that [employer] may have the closest cancer genetic counselors to her location.  Some of his other children live in different states, so we discussed that they could use the [employer] ([email or web address]) to search for cancer genetic counselors by zip code.     SUPPORT AND RESOURCES: Mr. Walraven was provided with a copy of the NCCN BRCA mutation-positive screening guidelines.  He was also provided with a copy of his  pedigree/family history and some information for registering with the [place] Inherited Cancer Registry through [facility] in [place], which helps to advance research into and understanding of hereditary cancer syndromes.  He will be sent a copy of his positive test result and positive results letter through secure email.    We encouraged Mr. Hackenberg to remain in contact with [place] on an annual basis so we can update his personal and family histories, and let him know of  advances in cancer genetics that may benefit the family. Our contact number was provided. Mr. Bolle questions were answered to his satisfaction today, and he knows he is welcome to call anytime with additional questions.    Jeanine Luz, MS Genetic Counselor Phone: [phone] [email or web address]   _______________________________________________________________________ For Office Staff:  Number of people involved in session: 1 Was an Intern/ student involved with case: no

## 2015-02-07 ENCOUNTER — Encounter: Payer: Self-pay | Admitting: Genetic Counselor

## 2015-02-07 ENCOUNTER — Other Ambulatory Visit: Payer: Self-pay | Admitting: Family Medicine

## 2015-02-07 DIAGNOSIS — Z1501 Genetic susceptibility to malignant neoplasm of breast: Secondary | ICD-10-CM

## 2015-02-07 DIAGNOSIS — Z1509 Genetic susceptibility to other malignant neoplasm: Principal | ICD-10-CM

## 2015-02-13 ENCOUNTER — Other Ambulatory Visit: Payer: Self-pay

## 2015-02-13 ENCOUNTER — Telehealth: Payer: Self-pay | Admitting: Family Medicine

## 2015-02-13 ENCOUNTER — Encounter: Payer: Self-pay | Admitting: Endocrinology

## 2015-02-13 LAB — HM DIABETES EYE EXAM

## 2015-02-13 NOTE — Telephone Encounter (Signed)
Noted  

## 2015-02-13 NOTE — Telephone Encounter (Signed)
lmovm for pt to call and schedule an appt with Dr Yong Channel for cpap

## 2015-02-20 ENCOUNTER — Encounter: Payer: Self-pay | Admitting: Family Medicine

## 2015-02-20 ENCOUNTER — Ambulatory Visit (INDEPENDENT_AMBULATORY_CARE_PROVIDER_SITE_OTHER): Payer: Medicare Other | Admitting: Family Medicine

## 2015-02-20 VITALS — BP 118/72 | HR 57 | Temp 98.6°F | Wt 231.0 lb

## 2015-02-20 DIAGNOSIS — I10 Essential (primary) hypertension: Secondary | ICD-10-CM | POA: Diagnosis not present

## 2015-02-20 DIAGNOSIS — G4733 Obstructive sleep apnea (adult) (pediatric): Secondary | ICD-10-CM

## 2015-02-20 NOTE — Progress Notes (Signed)
Garret Reddish, MD  Subjective:  Paul [name] is a 70 y.o. year old very pleasant male patient who presents for/with See problem oriented charting ROS- denies chest pain- other than breast tenderness- or shortness of breath. Does have some breast tenderness though. Denies swelling or nodules in breasts  Past Medical History-  Patient Active Problem List   Diagnosis Date Noted  . Chronic diastolic heart failure ([place]) 11/26/2013    Priority: High  . Melanoma of skin ([place]) 12/18/2012    Priority: High  . Type 2 diabetes, controlled, with renal manifestation ([place]) 07/28/2006    Priority: High  . Essential hypertension 07/28/2006    Priority: High  . CKD (chronic kidney disease), stage III 09/19/2014    Priority: Medium  . Obstructive sleep apnea 03/27/2014    Priority: Medium  . Former smoker 01/02/2014    Priority: Medium  . Mild aortic stenosis 11/26/2013    Priority: Medium  . Hyperlipidemia 07/28/2006    Priority: Medium  . Depression 07/28/2006    Priority: Medium  . Diverticulosis of colon with hemorrhage 08/10/2013    Priority: Low  . Hx of adenomatous colonic polyps 08/10/2013    Priority: Low  . Erectile dysfunction 09/04/2009    Priority: Low  . Obesity 12/28/2007    Priority: Low  . Gout 06/28/2007    Priority: Low  . Anemia 12/27/2006    Priority: Low  . MICROALBUMINURIA 07/28/2006    Priority: Low  . BRCA2 positive 02/06/2015  . Genetic testing 02/06/2015  . 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 ACCU-CHEK FASTCLIX LANCETS MISC Use to check blood sugar 2 times per day 200 each 2  . atorvastatin (LIPITOR) 20 MG tablet TAKE 1 TABLET BY MOUTH  DAILY AT 6 PM. 90 tablet 2  . bumetanide (BUMEX) 2 MG tablet Take 1 tablet (2 mg total) by mouth daily. 90 tablet 3  . Cholecalciferol (VITAMIN D-3) 1000 UNITS CAPS Take 2,000 Units by mouth daily.    Marland Kitchen diltiazem  (CARDIZEM CD) 360 MG 24 hr capsule Take 1 capsule by mouth  daily with breakfast 90 capsule 2  . hydrALAZINE (APRESOLINE) 50 MG tablet Take 1 tablet by mouth two  times daily 180 tablet 3  . Insulin Glargine (LANTUS SOLOSTAR) 100 UNIT/ML Solostar Pen Inject 130 Units into the skin every morning. And pen needles 2/day 60 pen 3  . labetalol (NORMODYNE) 200 MG tablet Take 1 tablet by mouth two  times daily 180 tablet 3  . Lancet Devices (AUTOLET IMPRESSION) MISC You to check blood sugar 2 times a day 2 each 1  . Multiple Vitamins-Minerals (CENTURY SENIOR) TABS Take 1 tablet by mouth daily.    . Omega-3 Fatty Acids (FISH OIL) 1200 MG CAPS Take 1,200 mg by mouth daily.    Marland Kitchen PARoxetine (PAXIL) 20 MG tablet Take 1 tablet (20 mg total) by mouth daily. 90 tablet 3  . spironolactone (ALDACTONE) 25 MG tablet Take 1 tablet (25 mg total) by mouth daily. 90 tablet 3  . valsartan (DIOVAN) 320 MG tablet Take 1 tablet by mouth  daily 90 tablet 2  . tadalafil (CIALIS) 20 MG tablet Take 0.5-1 tablets (10-20 mg total) by mouth every other day as needed for erectile dysfunction. (Patient not taking: Reported on 02/20/2015) 5 tablet 11   No current facility-administered medications for this visit.    Objective: BP 118/72 mmHg  Pulse 57  Temp(Src) 98.6 F (37 C)  Wt 231 lb (104.781 kg) Gen: NAD, resting comfortably CV: RRR 3/5 SEM murmur, no rubs or gallops Breast exam: tender mildly diffusely but without any nodules Lungs: CTAB no crackles, wheeze, rhonchi Abdomen: soft/nontender/nondistended/normal bowel sounds. No rebound or guarding.  Ext: no edema Skin: warm, dry Neuro: grossly normal, moves all extremities  Assessment/Plan:  Essential hypertension S: controlled on Diltiazem, hydralazine '50mg'$  BID, labetalol '200mg'$  BID, valsartan '320mg'$ , bumex '2mg'$ , spironolactone '25mg'$ . Unfortunately has breast tenderness on spironolactone. When he trials off it improves.  A/P: trial 12.'5mg'$  spironolactone and continue  other meds. He will update me in 2 weeks. He has lost 5 lbs since last visit- eating more oatmeal- congratulated on progress.    Obstructive sleep apnea S:Patient is able to keep cpap on all night for 7-8 hours of sleep. Patient continues to note clinical improvement with decreased daytime sleepiness. Spouse no longer notes snoring.  A/P: this encounter will serve as face to face for CPAP needs. Patient is doing really well and should continue. Suspect blood pressure would worsen off medicine as well.   Has scheduled follow up in about 2 months

## 2015-02-20 NOTE — Assessment & Plan Note (Signed)
S: controlled on Diltiazem, hydralazine 50mg  BID, labetalol 200mg  BID, valsartan 320mg , bumex 2mg , spironolactone 25mg . Unfortunately has breast tenderness on spironolactone. When he trials off it improves.  A/P: trial 12.5mg  spironolactone and continue other meds. He will update me in 2 weeks. He has lost 5 lbs since last visit- eating more oatmeal- congratulated on progress.

## 2015-02-20 NOTE — Patient Instructions (Signed)
Let's trial half tab of the spironolactone instead of full tab. Update me in about 2 weeks if breast tenderness is doing better overall compared to full tab and also if blood pressure is controlled- send me some of your #s for example  We will fill out form for CPAP  Great job losing 5 lbs!

## 2015-02-20 NOTE — Assessment & Plan Note (Signed)
S:Patient is able to keep cpap on all night for 7-8 hours of sleep. Patient continues to note clinical improvement with decreased daytime sleepiness. Spouse no longer notes snoring.  A/P: this encounter will serve as face to face for CPAP needs. Patient is doing really well and should continue. Suspect blood pressure would worsen off medicine as well.

## 2015-03-01 ENCOUNTER — Other Ambulatory Visit: Payer: Self-pay | Admitting: Family Medicine

## 2015-03-06 ENCOUNTER — Encounter: Payer: Self-pay | Admitting: Family Medicine

## 2015-04-06 NOTE — Progress Notes (Signed)
Subjective:    Patient ID: Paul [name], male    DOB: May 31, 1945, 70 y.o.   MRN: [id number]  HPI Pt returns for f/u of diabetes mellitus: DM type: Insulin-requiring type 2 Dx'ed: 7026 Complications: renal insufficiency and background retinopathy.   Therapy: insulin since 2009 DKA: never Severe hypoglycemia: never.   Pancreatitis: never.   Other: he takes qd insulin, after poor results with taking it more frequently; he declines weight loss surgery.   Interval history: pt states he feels well in general.  He says he does not miss any insulin doses.  Meter is downloaded today, and the printout is scanned into the record.  It varies from 80-200.  There is no trend throughout the day.   Past Medical History  Diagnosis Date  . Diabetes mellitus     type 2  . Depression   . Hypertension   . Hyperlipidemia   . Microalbuminuria   . melanoma dx'd 12/2012    rt arm; surg   . Colon polyps 12/09/2009    Multiple in descending colon  . Diverticulosis 12/09/2009    Moderate    Past Surgical History  Procedure Laterality Date  . Tonsillectomy and adenoidectomy    . Mass excision  09/29/2011    [facility]-Excision sebaceous cyst on neck  . Melanoma excision  12/2012    Social History   Social History  . Marital Status: Married    Spouse Name: N/A  . Number of Children: 4  . Years of Education: N/A   Occupational History  . 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 Smoking status: Former Smoker -- 1.50 packs/day for 45 years    Types: Cigars, Cigarettes    Quit date: 09/13/1998  . Smokeless tobacco: Never Used  . Alcohol Use: 0.0 oz/week    0 Standard drinks or equivalent per week     Comment: very little/occ  . Drug Use: No  . Sexual Activity: No   Other Topics Concern  . Not on file   Social History Narrative   Married 4 kids. 6 grandkids.       Retired from [employer]: travel, reading    Current Outpatient Prescriptions on File Prior  to Visit  Medication Sig Dispense Refill  . ACCU-CHEK FASTCLIX LANCETS MISC Use to check blood sugar 2 times per day 200 each 2  . atorvastatin (LIPITOR) 20 MG tablet TAKE 1 TABLET BY MOUTH  DAILY AT 6 PM. 90 tablet 2  . bumetanide (BUMEX) 2 MG tablet Take 1 tablet (2 mg total) by mouth daily. 90 tablet 3  . Cholecalciferol (VITAMIN D-3) 1000 UNITS CAPS Take 2,000 Units by mouth daily.    Marland Kitchen diltiazem (CARDIZEM CD) 360 MG 24 hr capsule Take 1 capsule by mouth  daily with breakfast 90 capsule 2  . hydrALAZINE (APRESOLINE) 50 MG tablet Take 1 tablet by mouth two  times daily 180 tablet 3  . Insulin Glargine (LANTUS SOLOSTAR) 100 UNIT/ML Solostar Pen Inject 130 Units into the skin every morning. And pen needles 2/day 60 pen 3  . labetalol (NORMODYNE) 200 MG tablet Take 1 tablet by mouth two  times daily 180 tablet 3  . Lancet Devices (AUTOLET IMPRESSION) MISC You to check blood sugar 2 times a day 2 each 1  . Multiple Vitamins-Minerals (CENTURY SENIOR) TABS Take 1 tablet by mouth daily.    . Omega-3 Fatty Acids (FISH OIL) 1200 MG CAPS Take 1,200 mg  by mouth daily.    Marland Kitchen PARoxetine (PAXIL) 20 MG tablet Take 1 tablet (20 mg total) by mouth daily. 90 tablet 3  . spironolactone (ALDACTONE) 25 MG tablet Take 1 tablet by mouth  daily 90 tablet 0  . tadalafil (CIALIS) 20 MG tablet Take 0.5-1 tablets (10-20 mg total) by mouth every other day as needed for erectile dysfunction. 5 tablet 11  . valsartan (DIOVAN) 320 MG tablet Take 1 tablet by mouth  daily 90 tablet 2   No current facility-administered medications on file prior to visit.    Allergies  Allergen Reactions  . Amlodipine Swelling    Family History  Problem Relation Age of Onset  . Heart attack Mother     10 from chemo  . Breast cancer Mother     dx. <68; +chemo  . Hypertension Father   . Cerebral aneurysm Father   . BRCA 1/2 Maternal Aunt     never tested, but daughter has known mutation in BRCA2  . Kidney failure Maternal Aunt   .  Stomach cancer Maternal Uncle     d. 66; smoker and issues with heartburn  . Heart Problems Paternal Grandfather   . BRCA 1/2 Cousin     BRCA2+; maternal 1st cousin; first person tested in family  . Breast cancer Cousin 59    BP 118/66 mmHg  Pulse 60  Temp(Src) 98.2 F (36.8 C) (Oral)  Ht '5\' 4"'$  (1.626 m)  Wt 231 lb 6 oz (104.951 kg)  BMI 39.70 kg/m2  Review of Systems He denies hypoglycemia.      Objective:   Physical Exam VITAL SIGNS: See vs page GENERAL: no distress Pulses: dorsalis pedis intact bilat.   MSK: no deformity of the feet.   CV: 1+ bilat leg edema.   Skin: no ulcer on the feet. normal color and temp on the feet.  Neuro: sensation is intact to touch on the feet. Ext: There is bilateral onychomycosis of the toenails.     A1c=7.6%    Assessment & Plan:  DM: this is the best control this pt should aim for, given this regimen, which does match insulin to her changing needs throughout the day.  Patient is advised the following: Patient Instructions  Please continue the same insulin.   Let me know if "basaglar" is cheaper than lantus, and we'll change. check your blood sugar twice a day.  vary the time of day when you check, between before the 3 meals, and at bedtime.  also check if you have symptoms of your blood sugar being too high or too low.  please keep a record of the readings and bring it to your next appointment here.  please call us sooner if your blood sugar goes below 70, or if you have a lot of readings over 200.    On this type of insulin schedule, you should eat meals on a regular schedule.  If a meal is missed or significantly delayed, your blood sugar could go low.   Please come back for a follow-up appointment in 4 months.

## 2015-04-07 ENCOUNTER — Ambulatory Visit (INDEPENDENT_AMBULATORY_CARE_PROVIDER_SITE_OTHER): Payer: Medicare Other | Admitting: Endocrinology

## 2015-04-07 ENCOUNTER — Encounter: Payer: Self-pay | Admitting: Endocrinology

## 2015-04-07 VITALS — BP 118/66 | HR 60 | Temp 98.2°F | Ht 64.0 in | Wt 231.4 lb

## 2015-04-07 DIAGNOSIS — E1122 Type 2 diabetes mellitus with diabetic chronic kidney disease: Secondary | ICD-10-CM | POA: Diagnosis not present

## 2015-04-07 DIAGNOSIS — N183 Chronic kidney disease, stage 3 (moderate): Secondary | ICD-10-CM

## 2015-04-07 DIAGNOSIS — Z794 Long term (current) use of insulin: Secondary | ICD-10-CM

## 2015-04-07 LAB — POCT GLYCOSYLATED HEMOGLOBIN (HGB A1C): Hemoglobin A1C: 7.6

## 2015-04-07 NOTE — Patient Instructions (Addendum)
Please continue the same insulin.   Let me know if "basaglar" is cheaper than lantus, and we'll change. check your blood sugar twice a day.  vary the time of day when you check, between before the 3 meals, and at bedtime.  also check if you have symptoms of your blood sugar being too high or too low.  please keep a record of the readings and bring it to your next appointment here.  please call us sooner if your blood sugar goes below 70, or if you have a lot of readings over 200.    On this type of insulin schedule, you should eat meals on a regular schedule.  If a meal is missed or significantly delayed, your blood sugar could go low.   Please come back for a follow-up appointment in 4 months.

## 2015-04-09 ENCOUNTER — Encounter: Payer: Self-pay | Admitting: Endocrinology

## 2015-04-20 ENCOUNTER — Encounter: Payer: Self-pay | Admitting: Family Medicine

## 2015-04-21 ENCOUNTER — Other Ambulatory Visit (INDEPENDENT_AMBULATORY_CARE_PROVIDER_SITE_OTHER): Payer: Medicare Other

## 2015-04-21 ENCOUNTER — Other Ambulatory Visit: Payer: Self-pay | Admitting: Family Medicine

## 2015-04-21 DIAGNOSIS — I10 Essential (primary) hypertension: Secondary | ICD-10-CM

## 2015-04-21 LAB — COMPREHENSIVE METABOLIC PANEL
ALBUMIN: 4.1 g/dL (ref 3.5–5.2)
ALK PHOS: 70 U/L (ref 39–117)
ALT: 56 U/L — AB (ref 0–53)
AST: 38 U/L — AB (ref 0–37)
BILIRUBIN TOTAL: 0.4 mg/dL (ref 0.2–1.2)
BUN: 50 mg/dL — ABNORMAL HIGH (ref 6–23)
CALCIUM: 9.9 mg/dL (ref 8.4–10.5)
CO2: 27 mEq/L (ref 19–32)
CREATININE: 2.03 mg/dL — AB (ref 0.40–1.50)
Chloride: 103 mEq/L (ref 96–112)
GFR: 34.51 mL/min — ABNORMAL LOW (ref >60.00)
Glucose, Bld: 171 mg/dL — ABNORMAL HIGH (ref 70–99)
Potassium: 4.6 mEq/L (ref 3.5–5.1)
Sodium: 138 mEq/L (ref 135–145)
Total Protein: 7 g/dL (ref 6.0–8.3)

## 2015-04-21 LAB — CBC
HCT: 37.7 % — ABNORMAL LOW (ref 39.0–52.0)
Hemoglobin: 12.9 g/dL — ABNORMAL LOW (ref 13.0–17.0)
MCHC: 34.3 g/dL (ref 30.0–36.0)
MCV: 90.7 fl (ref 78.0–100.0)
PLATELETS: 221 10*3/uL (ref 150.0–400.0)
RBC: 4.16 Mil/uL — AB (ref 4.22–5.81)
RDW: 13.9 % (ref 11.5–15.5)
WBC: 9.2 10*3/uL (ref 4.0–10.5)

## 2015-04-21 NOTE — Telephone Encounter (Signed)
CBC and CMET under hypertension is fine

## 2015-04-28 ENCOUNTER — Encounter: Payer: Self-pay | Admitting: Family Medicine

## 2015-04-28 ENCOUNTER — Ambulatory Visit (INDEPENDENT_AMBULATORY_CARE_PROVIDER_SITE_OTHER): Payer: Medicare Other | Admitting: Family Medicine

## 2015-04-28 VITALS — BP 130/78 | HR 64 | Temp 98.5°F | Wt 235.0 lb

## 2015-04-28 DIAGNOSIS — I10 Essential (primary) hypertension: Secondary | ICD-10-CM | POA: Diagnosis not present

## 2015-04-28 DIAGNOSIS — N5201 Erectile dysfunction due to arterial insufficiency: Secondary | ICD-10-CM | POA: Diagnosis not present

## 2015-04-28 DIAGNOSIS — N183 Chronic kidney disease, stage 3 unspecified: Secondary | ICD-10-CM

## 2015-04-28 DIAGNOSIS — I5032 Chronic diastolic (congestive) heart failure: Secondary | ICD-10-CM

## 2015-04-28 MED ORDER — SILDENAFIL CITRATE 100 MG PO TABS: 100.0000 mg | ORAL_TABLET | Freq: Every day | ORAL | Status: DC | PRN

## 2015-04-28 MED ORDER — CLONIDINE HCL 0.1 MG PO TABS: 0.1000 mg | ORAL_TABLET | Freq: Two times a day (BID) | ORAL | Status: DC

## 2015-04-28 NOTE — Patient Instructions (Addendum)
Stop spironolactone for now  Start clonidine 0.1mg  twice a day  Uptdate me in 10 day by mchart since you will be out of town. We will check on blood pressure and breast tenderness.   We will call you within a week about your referral to nephrology/kidney doctor (will likely take months to get in). If you do not hear within 2 weeks, give [place] a call.   I need to see you whenever you are in town so contact me because you may need potassium now coming off of spironolactone  Get labs before you leave on your 3 week trip- schedule at check out

## 2015-04-28 NOTE — Assessment & Plan Note (Signed)
S: weight up 4 lbs. No obvious changes. Continues spironolactone and bumex A/P: we will watch Bp and edema and weight closely as stops spironolactone.  Get repeat BMET next week as may have to stop potassium.

## 2015-04-28 NOTE — Assessment & Plan Note (Addendum)
S: controlled on Diltiazem, hydralazine 50mg  BID, labetalol 200mg  BID, valsartan 320mg , bumex 2mg , spironolactone 25mg  last visit in February. We went down to 12.5mg  due to breast tenderness on spironolactone (when he trialed off medicine it improved). Previously lost 5 lbs now gained 6. BP was elevated on 1/2 tab and did not resolve symptoms. Back on full tab continues to note Breast tenderness A/P:  Due to breast tenderness, we will attempt to stop the spironolactone. Will add clonidine 0.1mg  BID and recheck labs in 1 week- has potassium at home which may have to start.   Has had prior workup for resistant hypertension: renal duplex 2015 with no renal artery stenosis, dexamethasone suppression test normal 01/16/14. Spironolactone has been most effective addition for patient.

## 2015-04-28 NOTE — Progress Notes (Signed)
Garret Reddish, MD  Subjective:  Paul [name] is a 70 y.o. year old very pleasant male patient who presents for/with See problem oriented charting ROS- ROS No chest painNo headache or blurry vision. No edema.   Past Medical History-  Patient Active Problem List   Diagnosis Date Noted  . Chronic diastolic heart failure ([place]) 11/26/2013    Priority: High  . Melanoma of skin ([place]) 12/18/2012    Priority: High  . Type 2 diabetes, controlled, with renal manifestation ([place]) 07/28/2006    Priority: High  . Essential hypertension 07/28/2006    Priority: High  . CKD (chronic kidney disease), stage III 09/19/2014    Priority: Medium  . Obstructive sleep apnea 03/27/2014    Priority: Medium  . Former smoker 01/02/2014    Priority: Medium  . Mild aortic stenosis 11/26/2013    Priority: Medium  . Hyperlipidemia 07/28/2006    Priority: Medium  . Depression 07/28/2006    Priority: Medium  . Diverticulosis of colon with hemorrhage 08/10/2013    Priority: Low  . Hx of adenomatous colonic polyps 08/10/2013    Priority: Low  . Erectile dysfunction 09/04/2009    Priority: Low  . Obesity 12/28/2007    Priority: Low  . Gout 06/28/2007    Priority: Low  . Anemia 12/27/2006    Priority: Low  . MICROALBUMINURIA 07/28/2006    Priority: Low  . BRCA2 positive 02/06/2015  . Genetic testing 02/06/2015  . 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 ACCU-CHEK FASTCLIX LANCETS MISC Use to check blood sugar 2 times per day 200 each 2  . atorvastatin (LIPITOR) 20 MG tablet TAKE 1 TABLET BY MOUTH  DAILY AT 6 PM. 90 tablet 2  . bumetanide (BUMEX) 2 MG tablet Take 1 tablet (2 mg total) by mouth daily. 90 tablet 3  . Cholecalciferol (VITAMIN D-3) 1000 UNITS CAPS Take 2,000 Units by mouth daily.    Marland Kitchen diltiazem (CARDIZEM CD) 360 MG 24 hr capsule Take 1 capsule by mouth  daily with breakfast 90 capsule 2   . hydrALAZINE (APRESOLINE) 50 MG tablet Take 1 tablet by mouth two  times daily 180 tablet 3  . Insulin Glargine (LANTUS SOLOSTAR) 100 UNIT/ML Solostar Pen Inject 130 Units into the skin every morning. And pen needles 2/day 60 pen 3  . labetalol (NORMODYNE) 200 MG tablet Take 1 tablet by mouth two  times daily 180 tablet 3  . Lancet Devices (AUTOLET IMPRESSION) MISC You to check blood sugar 2 times a day 2 each 1  . Multiple Vitamins-Minerals (CENTURY SENIOR) TABS Take 1 tablet by mouth daily.    . Omega-3 Fatty Acids (FISH OIL) 1200 MG CAPS Take 1,200 mg by mouth daily.    Marland Kitchen PARoxetine (PAXIL) 20 MG tablet Take 1 tablet (20 mg total) by mouth daily. 90 tablet 3  . tadalafil (CIALIS) 20 MG tablet Take 0.5-1 tablets (10-20 mg total) by mouth every other day as needed for erectile dysfunction. 5 tablet 11  . valsartan (DIOVAN) 320 MG tablet Take 1 tablet by mouth  daily 90 tablet 2  . cloNIDine (CATAPRES) 0.1 MG tablet Take 1 tablet (0.1 mg total) by mouth 2 (two) times daily. 60 tablet 11  . sildenafil (VIAGRA) 100 MG tablet Take 1 tablet (100 mg total) by mouth daily as needed for erectile dysfunction (Single Pack requested. May only buy a few pills.). 10 tablet 5  No current facility-administered medications for this visit.    Objective: BP 130/78 mmHg  Pulse 64  Temp(Src) 98.5 F (36.9 C)  Wt 235 lb (106.595 kg) Gen: NAD, resting comfortably CV: RRR no murmurs rubs or gallops Lungs: CTAB no crackles, wheeze, rhonchi Abdomen: soft/nontender/nondistended/normal bowel sounds. No rebound or guarding.  Ext: no edema Skin: warm, dry Neuro: grossly normal, moves all extremities  Assessment/Plan:  Essential hypertension S: controlled on Diltiazem, hydralazine 23m BID, labetalol 20107mBID, valsartan 32032mbumex 2mg61mpironolactone 25mg47mt visit in February. We went down to 12.5mg d70mto breast tenderness on spironolactone (when he trialed off medicine it improved). Previously lost 5  lbs now gained 6. BP was elevated on 1/2 tab and did not resolve symptoms. Back on full tab continues to note Breast tenderness A/P:  Due to breast tenderness, we will attempt to stop the spironolactone. Will add clonidine 0.1mg BI38mnd recheck labs in 1 week- has potassium at home which may have to start.   Has had prior workup for resistant hypertension: renal duplex 2015 with no renal artery stenosis, dexamethasone suppression test normal 01/16/14. Spironolactone has been most effective addition for patient.   CKD (chronic kidney disease), stage III S: GFR in 40s down to 30s with last creatinine worsening from 1.65 to just over 2. Has resistant hypertension on 6 BP meds. BUN increased more than hoped as well A/P: perhaps mild dehydration contributing and given breast tenderness stop spironolactone. Given on 6 meds for hypertensio- will refer to nephrology for their opinion though aware it may take several months.    Chronic diastolic heart failure S: weight up 4 lbs. No obvious changes. Continues spironolactone and bumex A/P: we will watch Bp and edema and weight closely as stops spironolactone.  Get repeat BMET next week as may have to stop potassium.     Has 3 week trip upcoming- states he will call me when back to schedule follo wup.  Return precautions advised.   Orders Placed This Encounter  Procedures  . Basic metabolic panel    Standing Status: Future     Number of Occurrences:      Standing Expiration Date: 04/27/2016  . Ambulatory referral to Nephrology    Referral Priority:  Routine    Referral Type:  Consultation    Referral Reason:  Specialty Services Required    Requested Specialty:  Nephrology    Number of Visits Requested:  1    Meds ordered this encounter  Medications  . sildenafil (VIAGRA) 100 MG tablet    Sig: Take 1 tablet (100 mg total) by mouth daily as needed for erectile dysfunction (Single Pack requested. May only buy a few pills.).    Dispense:  10  tablet    Refill:  5  . cloNIDine (CATAPRES) 0.1 MG tablet    Sig: Take 1 tablet (0.1 mg total) by mouth 2 (two) times daily.    Dispense:  60 tablet    Refill:  11

## 2015-04-28 NOTE — Assessment & Plan Note (Signed)
S: GFR in 40s down to 30s with last creatinine worsening from 1.65 to just over 2. Has resistant hypertension on 6 BP meds. BUN increased more than hoped as well A/P: perhaps mild dehydration contributing and given breast tenderness stop spironolactone. Given on 6 meds for hypertensio- will refer to nephrology for their opinion though aware it may take several months.

## 2015-04-30 ENCOUNTER — Other Ambulatory Visit (INDEPENDENT_AMBULATORY_CARE_PROVIDER_SITE_OTHER): Payer: Medicare Other

## 2015-04-30 DIAGNOSIS — I10 Essential (primary) hypertension: Secondary | ICD-10-CM

## 2015-04-30 LAB — BASIC METABOLIC PANEL
BUN: 41 mg/dL — ABNORMAL HIGH (ref 6–23)
CALCIUM: 9.6 mg/dL (ref 8.4–10.5)
CO2: 26 meq/L (ref 19–32)
Chloride: 107 mEq/L (ref 96–112)
Creatinine, Ser: 1.76 mg/dL — ABNORMAL HIGH (ref 0.40–1.50)
GFR: 40.69 mL/min — AB (ref >60.00)
Glucose, Bld: 133 mg/dL — ABNORMAL HIGH (ref 70–99)
Potassium: 4.1 mEq/L (ref 3.5–5.1)
SODIUM: 140 meq/L (ref 135–145)

## 2015-05-27 ENCOUNTER — Other Ambulatory Visit: Payer: Self-pay | Admitting: Internal Medicine

## 2015-06-02 ENCOUNTER — Encounter: Payer: Self-pay | Admitting: Family Medicine

## 2015-07-03 ENCOUNTER — Encounter: Payer: Self-pay | Admitting: Family Medicine

## 2015-07-03 ENCOUNTER — Ambulatory Visit (INDEPENDENT_AMBULATORY_CARE_PROVIDER_SITE_OTHER): Payer: Medicare Other | Admitting: Family Medicine

## 2015-07-03 DIAGNOSIS — M109 Gout, unspecified: Secondary | ICD-10-CM

## 2015-07-03 MED ORDER — PREDNISONE 10 MG PO TABS: ORAL_TABLET | ORAL | Status: DC

## 2015-07-03 NOTE — Progress Notes (Signed)
Subjective:    Patient ID: Paul [name], male    DOB: 1945/08/17, 70 y.o.   MRN: [id number]  HPI  Paul [name] is a 70 year old male who presents today reporting a flare of his gout in his right foot. Symptom of pain started 5 days which he rates as a 2 and throbbing at times.  Associated symptom of erythema is present. Aggravated by walking and improved with rest with elevation.  He reports seeking care today due to an upcoming trip to [place].  Treatment at home includes elevation and hydrating with water which have provided moderate benefit.  Pertinent history of HTN, CKD stage III, and chronic diastolic heart failure. He reports a flare with gout "many years ago" and states that he recently was not drinking enough water and believes that his symptoms started after failing to hydrate properly. He reports watching his diet and specifically high fructose corn syrup and alcohol intake.  Review of Systems  Constitutional: Negative for fever and chills.  Respiratory: Negative for shortness of breath.   Cardiovascular: Negative for chest pain and palpitations.  Gastrointestinal: Negative for nausea, vomiting, abdominal pain, diarrhea and constipation.  Genitourinary: Negative for dysuria, frequency and hematuria.  Musculoskeletal: Positive for joint swelling.  Neurological: Negative for dizziness and light-headedness.   Past Medical History  Diagnosis Date  . Diabetes mellitus     type 2  . Depression   . Hypertension   . Hyperlipidemia   . Microalbuminuria   . melanoma dx'd 12/2012    rt arm; surg   . Colon polyps 12/09/2009    Multiple in descending colon  . Diverticulosis 12/09/2009    Moderate     Social History   Social History  . Marital Status: Married    Spouse Name: N/A  . Number of Children: 4  . Years of Education: N/A   Occupational History  . 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 Smoking status: Former Smoker -- 1.50 packs/day for 45  years    Types: Cigars, Cigarettes    Quit date: 09/13/1998  . Smokeless tobacco: Never Used  . Alcohol Use: 0.0 oz/week    0 Standard drinks or equivalent per week     Comment: very little/occ  . Drug Use: No  . Sexual Activity: No   Other Topics Concern  . Not on file   Social History Narrative   Married 4 kids. 6 grandkids.       Retired from [employer]: travel, reading    Past Surgical History  Procedure Laterality Date  . Tonsillectomy and adenoidectomy    . Mass excision  09/29/2011    [facility]-Excision sebaceous cyst on neck  . Melanoma excision  12/2012    Family History  Problem Relation Age of Onset  . Heart attack Mother     22 from chemo  . Breast cancer Mother     dx. <68; +chemo  . Hypertension Father   . Cerebral aneurysm Father   . BRCA 1/2 Maternal Aunt     never tested, but daughter has known mutation in BRCA2  . Kidney failure Maternal Aunt   . Stomach cancer Maternal Uncle     d. 58; smoker and issues with heartburn  . Heart Problems Paternal Grandfather   . BRCA 1/2 Cousin     BRCA2+; maternal 1st cousin; first person tested in family  . Breast cancer Cousin 6    Allergies  Allergen Reactions  . Amlodipine Swelling    Current Outpatient Prescriptions on File Prior to Visit  Medication Sig Dispense Refill  . ACCU-CHEK FASTCLIX LANCETS MISC Use to check blood sugar 2 times per day 200 each 2  . atorvastatin (LIPITOR) 20 MG tablet TAKE 1 TABLET BY MOUTH  DAILY AT 6 PM. 90 tablet 2  . bumetanide (BUMEX) 2 MG tablet Take 1 tablet (2 mg total) by mouth daily. 90 tablet 3  . Cholecalciferol (VITAMIN D-3) 1000 UNITS CAPS Take 2,000 Units by mouth daily.    . cloNIDine (CATAPRES) 0.1 MG tablet Take 1 tablet (0.1 mg total) by mouth 2 (two) times daily. 60 tablet 11  . diltiazem (CARDIZEM CD) 360 MG 24 hr capsule Take 1 capsule by mouth  daily with breakfast 90 capsule 2  . hydrALAZINE (APRESOLINE) 50 MG tablet Take 1 tablet by mouth two   times daily 180 tablet 3  . Insulin Glargine (LANTUS SOLOSTAR) 100 UNIT/ML Solostar Pen Inject 130 Units into the skin every morning. And pen needles 2/day 60 pen 3  . labetalol (NORMODYNE) 200 MG tablet Take 1 tablet by mouth two  times daily 180 tablet 3  . Lancet Devices (AUTOLET IMPRESSION) MISC You to check blood sugar 2 times a day 2 each 1  . Multiple Vitamins-Minerals (CENTURY SENIOR) TABS Take 1 tablet by mouth daily.    . Omega-3 Fatty Acids (FISH OIL) 1200 MG CAPS Take 1,200 mg by mouth daily.    Marland Kitchen PARoxetine (PAXIL) 20 MG tablet Take 1 tablet (20 mg total) by mouth daily. 90 tablet 3  . sildenafil (VIAGRA) 100 MG tablet Take 1 tablet (100 mg total) by mouth daily as needed for erectile dysfunction (Single Pack requested. May only buy a few pills.). 10 tablet 5  . tadalafil (CIALIS) 20 MG tablet Take 0.5-1 tablets (10-20 mg total) by mouth every other day as needed for erectile dysfunction. 5 tablet 11  . valsartan (DIOVAN) 320 MG tablet Take 1 tablet by mouth  daily 90 tablet 2   No current facility-administered medications on file prior to visit.    BP 100/70 mmHg  Pulse 50  Temp(Src) 98.5 F (36.9 C) (Oral)  Ht 5\' 4"  (1.626 m)  Wt 230 lb (104.327 kg)  BMI 39.46 kg/m2  SpO2 96%       Objective:   Physical Exam  Constitutional: He is oriented to person, place, and time. He appears well-developed and well-nourished.  Cardiovascular: Normal rate, regular rhythm and intact distal pulses.   Pulmonary/Chest: Effort normal and breath sounds normal.  Abdominal: Soft. Bowel sounds are normal.  Musculoskeletal:  Erythema and minimal edema noted in right great toe  Neurological: He is alert and oriented to person, place, and time.  Skin: Skin is warm and dry. There is erythema.         1. Acute gout involving toe of right foot, unspecified cause Erythema with minimal edema in toe of patient's right foot supports treatment. Due to history of renal impairment he is not a  candidate for NSAID therapy. Taper of prednisone provided with instructions regarding hydration, avoidance of alcohol intake, and avoidance of products containing high fructose corn syrup. Advised patient to follow up with his PCP upon return from his trip to address symptoms as another flare is possible and he has a remote history of gout noted. Patient voiced understanding and agreed with plan.   - predniSONE (DELTASONE) 10 MG tablet; Take 4 tablets once daily for 2  days, 3 tablets daily for 2 days, 2 tablets daily for 2 days, and one tablet daily for 2 days.  Dispense: 20 tablet; Refill: 0  Delano Metz, FNP-C

## 2015-07-03 NOTE — Patient Instructions (Signed)
Please take medication as directed and follow up with Dr. Yong Channel if symptoms do not improve or worsen with treatment.  Gout Gout is an inflammatory arthritis caused by a buildup of uric acid crystals in the joints. Uric acid is a chemical that is normally present in the blood. When the level of uric acid in the blood is too high it can form crystals that deposit in your joints and tissues. This causes joint redness, soreness, and swelling (inflammation). Repeat attacks are common. Over time, uric acid crystals can form into masses (tophi) near a joint, destroying bone and causing disfigurement. Gout is treatable and often preventable. CAUSES  The disease begins with elevated levels of uric acid in the blood. Uric acid is produced by your body when it breaks down a naturally found substance called purines. Certain foods you eat, such as meats and fish, contain high amounts of purines. Causes of an elevated uric acid level include:  Being passed down from parent to child (heredity).  Diseases that cause increased uric acid production (such as obesity, psoriasis, and certain cancers).  Excessive alcohol use.  Diet, especially diets rich in meat and seafood.  Medicines, including certain cancer-fighting medicines (chemotherapy), water pills (diuretics), and aspirin.  Chronic kidney disease. The kidneys are no longer able to remove uric acid well.  Problems with metabolism. Conditions strongly associated with gout include:  Obesity.  High blood pressure.  High cholesterol.  Diabetes. Not everyone with elevated uric acid levels gets gout. It is not understood why some people get gout and others do not. Surgery, joint injury, and eating too much of certain foods are some of the factors that can lead to gout attacks. SYMPTOMS   An attack of gout comes on quickly. It causes intense pain with redness, swelling, and warmth in a joint.  Fever can occur.  Often, only one joint is involved.  Certain joints are more commonly involved:  Base of the big toe.  Knee.  Ankle.  Wrist.  Finger. Without treatment, an attack usually goes away in a few days to weeks. Between attacks, you usually will not have symptoms, which is different from many other forms of arthritis. DIAGNOSIS  Your caregiver will suspect gout based on your symptoms and exam. In some cases, tests may be recommended. The tests may include:  Blood tests.  Urine tests.  X-rays.  Joint fluid exam. This exam requires a needle to remove fluid from the joint (arthrocentesis). Using a microscope, gout is confirmed when uric acid crystals are seen in the joint fluid. TREATMENT  There are two phases to gout treatment: treating the sudden onset (acute) attack and preventing attacks (prophylaxis).  Treatment of an Acute Attack.  Medicines are used. These include anti-inflammatory medicines or steroid medicines.  An injection of steroid medicine into the affected joint is sometimes necessary.  The painful joint is rested. Movement can worsen the arthritis.  You may use warm or cold treatments on painful joints, depending which works best for you.  Treatment to Prevent Attacks.  If you suffer from frequent gout attacks, your caregiver may advise preventive medicine. These medicines are started after the acute attack subsides. These medicines either help your kidneys eliminate uric acid from your body or decrease your uric acid production. You may need to stay on these medicines for a very long time.  The early phase of treatment with preventive medicine can be associated with an increase in acute gout attacks. For this reason, during the first few  months of treatment, your caregiver may also advise you to take medicines usually used for acute gout treatment. Be sure you understand your caregiver's directions. Your caregiver may make several adjustments to your medicine dose before these medicines are  effective.  Discuss dietary treatment with your caregiver or dietitian. Alcohol and drinks high in sugar and fructose and foods such as meat, poultry, and seafood can increase uric acid levels. Your caregiver or dietitian can advise you on drinks and foods that should be limited. HOME CARE INSTRUCTIONS   Do not take aspirin to relieve pain. This raises uric acid levels.  Only take over-the-counter or prescription medicines for pain, discomfort, or fever as directed by your caregiver.  Rest the joint as much as possible. When in bed, keep sheets and blankets off painful areas.  Keep the affected joint raised (elevated).  Apply warm or cold treatments to painful joints. Use of warm or cold treatments depends on which works best for you.  Use crutches if the painful joint is in your leg.  Drink enough fluids to keep your urine clear or pale yellow. This helps your body get rid of uric acid. Limit alcohol, sugary drinks, and fructose drinks.  Follow your dietary instructions. Pay careful attention to the amount of protein you eat. Your daily diet should emphasize fruits, vegetables, whole grains, and fat-free or low-fat milk products. Discuss the use of coffee, vitamin C, and cherries with your caregiver or dietitian. These may be helpful in lowering uric acid levels.  Maintain a healthy body weight. SEEK MEDICAL CARE IF:   You develop diarrhea, vomiting, or any side effects from medicines.  You do not feel better in 24 hours, or you are getting worse. SEEK IMMEDIATE MEDICAL CARE IF:   Your joint becomes suddenly more tender, and you have chills or a fever. MAKE SURE YOU:   Understand these instructions.  Will watch your condition.  Will get help right away if you are not doing well or get worse.   This information is not intended to replace advice given to you by your health care provider. Make sure you discuss any questions you have with your health care provider.   Document  Released: 01/02/2000 Document Revised: 01/25/2014 Document Reviewed: 08/18/2011 Elsevier Interactive Patient Education [employer].

## 2015-07-19 ENCOUNTER — Telehealth: Payer: Self-pay

## 2015-07-19 NOTE — Telephone Encounter (Signed)
Patient is on the list for [employer] 2017 and may be a good candidate for an AWV in 2017. Please let me know if/when appt is scheduled.   

## 2015-07-21 ENCOUNTER — Other Ambulatory Visit: Payer: Self-pay | Admitting: Family Medicine

## 2015-07-21 NOTE — Telephone Encounter (Signed)
Yes thanks 

## 2015-08-04 ENCOUNTER — Other Ambulatory Visit: Payer: Self-pay | Admitting: Family Medicine

## 2015-08-05 ENCOUNTER — Encounter: Payer: Self-pay | Admitting: Family Medicine

## 2015-08-07 ENCOUNTER — Ambulatory Visit: Payer: Medicare Other | Admitting: Endocrinology

## 2015-08-07 ENCOUNTER — Encounter: Payer: Self-pay | Admitting: Endocrinology

## 2015-08-07 ENCOUNTER — Ambulatory Visit (INDEPENDENT_AMBULATORY_CARE_PROVIDER_SITE_OTHER): Payer: Medicare Other | Admitting: Endocrinology

## 2015-08-07 VITALS — BP 118/70 | HR 56 | Temp 99.0°F | Wt 233.5 lb

## 2015-08-07 DIAGNOSIS — E1121 Type 2 diabetes mellitus with diabetic nephropathy: Secondary | ICD-10-CM

## 2015-08-07 MED ORDER — INSULIN GLARGINE 100 UNIT/ML SOLOSTAR PEN: 120.0000 [IU] | PEN_INJECTOR | SUBCUTANEOUS | Status: DC

## 2015-08-07 NOTE — Patient Instructions (Addendum)
Please continue the same insulin (120 units each morning).   check your blood sugar twice a day.  vary the time of day when you check, between before the 3 meals, and at bedtime.  also check if you have symptoms of your blood sugar being too high or too low.  please keep a record of the readings and bring it to your next appointment here.  please call us sooner if your blood sugar goes below 70, or if you have a lot of readings over 200.    On this type of insulin schedule, you should eat meals on a regular schedule.  If a meal is missed or significantly delayed, your blood sugar could go low.   Please come back for a follow-up appointment in 4 months.

## 2015-08-07 NOTE — Progress Notes (Signed)
Subjective:    Patient ID: Paul [name], male    DOB: 1945-10-28, 70 y.o.   MRN: [id number]  HPI Pt returns for f/u of diabetes mellitus: DM type: Insulin-requiring type 2 Dx'ed: 5573 Complications: renal insufficiency and background retinopathy.   Therapy: insulin since 2009 DKA: never Severe hypoglycemia: never.   Pancreatitis: never.   Other: he takes qd insulin, after poor results with taking it more frequently; he declines weight loss surgery.   Interval history: 2-3 months ago, he reduced the insulin to 120 units qd, due to hypoglycemia.  He says cbg's vary from 90-112.  There is no trend throughout the day.   Past Medical History  Diagnosis Date  . Diabetes mellitus     type 2  . Depression   . Hypertension   . Hyperlipidemia   . Microalbuminuria   . melanoma dx'd 12/2012    rt arm; surg   . Colon polyps 12/09/2009    Multiple in descending colon  . Diverticulosis 12/09/2009    Moderate    Past Surgical History  Procedure Laterality Date  . Tonsillectomy and adenoidectomy    . Mass excision  09/29/2011    [facility]-Excision sebaceous cyst on neck  . Melanoma excision  12/2012    Social History   Social History  . Marital Status: Married    Spouse Name: N/A  . Number of Children: 4  . Years of Education: N/A   Occupational History  . 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 Smoking status: Former Smoker -- 1.50 packs/day for 45 years    Types: Cigars, Cigarettes    Quit date: 09/13/1998  . Smokeless tobacco: Never Used  . Alcohol Use: 0.0 oz/week    0 Standard drinks or equivalent per week     Comment: very little/occ  . Drug Use: No  . Sexual Activity: No   Other Topics Concern  . Not on file   Social History Narrative   Married 4 kids. 6 grandkids.       Retired from [employer]: travel, reading    Current Outpatient Prescriptions on File Prior to Visit  Medication Sig Dispense Refill  . ACCU-CHEK FASTCLIX  LANCETS MISC Use to check blood sugar 2 times per day 200 each 2  . bumetanide (BUMEX) 2 MG tablet Take 1 tablet by mouth  daily 90 tablet 3  . CARDIZEM CD 360 MG 24 hr capsule Take 1 capsule by mouth  daily with breakfast 90 capsule 3  . Cholecalciferol (VITAMIN D-3) 1000 UNITS CAPS Take 2,000 Units by mouth daily.    Marland Kitchen labetalol (NORMODYNE) 200 MG tablet Take 1 tablet by mouth two  times daily 180 tablet 3  . Lancet Devices (AUTOLET IMPRESSION) MISC You to check blood sugar 2 times a day 2 each 1  . LIPITOR 20 MG tablet TAKE 1 TABLET BY MOUTH  DAILY AT 6 PM. 90 tablet 3  . Multiple Vitamins-Minerals (CENTURY SENIOR) TABS Take 1 tablet by mouth daily.    . Omega-3 Fatty Acids (FISH OIL) 1200 MG CAPS Take 1,200 mg by mouth daily.    Marland Kitchen PARoxetine (PAXIL) 20 MG tablet Take 1 tablet by mouth  daily 90 tablet 2  . sildenafil (VIAGRA) 100 MG tablet Take 1 tablet (100 mg total) by mouth daily as needed for erectile dysfunction (Single Pack requested. May only buy a few pills.). 10 tablet 5  . tadalafil (CIALIS) 20 MG tablet  Take 0.5-1 tablets (10-20 mg total) by mouth every other day as needed for erectile dysfunction. 5 tablet 11  . valsartan (DIOVAN) 320 MG tablet Take 1 tablet by mouth  daily 90 tablet 3   No current facility-administered medications on file prior to visit.    Allergies  Allergen Reactions  . Amlodipine Swelling    Family History  Problem Relation Age of Onset  . Heart attack Mother     49 from chemo  . Breast cancer Mother     dx. <68; +chemo  . Hypertension Father   . Cerebral aneurysm Father   . BRCA 1/2 Maternal Aunt     never tested, but daughter has known mutation in BRCA2  . Kidney failure Maternal Aunt   . Stomach cancer Maternal Uncle     d. 80; smoker and issues with heartburn  . Heart Problems Paternal Grandfather   . BRCA 1/2 Cousin     BRCA2+; maternal 1st cousin; first person tested in family  . Breast cancer Cousin 59    BP 118/70 mmHg  Pulse 56   Temp(Src) 99 F (37.2 C) (Oral)  Wt 233 lb 8 oz (105.915 kg)  Review of Systems No weight change    Objective:   Physical Exam VITAL SIGNS:  See vs page GENERAL: no distress Pulses: dorsalis pedis intact bilat.   MSK: no deformity of the feet CV: 1+ bilat leg edema Skin:  no ulcer on the feet.  normal color and temp on the feet. Neuro: sensation is intact to touch on the feet    Lab Results  Component Value Date   CREATININE 1.76* 04/30/2015   BUN 41* 04/30/2015   NA 140 04/30/2015   K 4.1 04/30/2015   CL 107 04/30/2015   CO2 26 04/30/2015      Assessment & Plan:  Insulin-requiring type 2 DM: this is the best control this pt should aim for, given this regimen, which does match insulin to his changing needs throughout the day.   Patient is advised the following: Patient Instructions  Please continue the same insulin (120 units each morning).   check your blood sugar twice a day.  vary the time of day when you check, between before the 3 meals, and at bedtime.  also check if you have symptoms of your blood sugar being too high or too low.  please keep a record of the readings and bring it to your next appointment here.  please call us sooner if your blood sugar goes below 70, or if you have a lot of readings over 200.    On this type of insulin schedule, you should eat meals on a regular schedule.  If a meal is missed or significantly delayed, your blood sugar could go low.   Please come back for a follow-up appointment in 4 months.     Renato Shin, MD

## 2015-08-13 ENCOUNTER — Ambulatory Visit: Payer: Medicare Other | Admitting: Endocrinology

## 2015-09-03 ENCOUNTER — Encounter: Payer: Self-pay | Admitting: Adult Health

## 2015-09-03 ENCOUNTER — Ambulatory Visit (INDEPENDENT_AMBULATORY_CARE_PROVIDER_SITE_OTHER): Payer: Medicare Other | Admitting: Adult Health

## 2015-09-03 VITALS — BP 142/70 | Temp 98.9°F | Ht 64.0 in | Wt 233.0 lb

## 2015-09-03 DIAGNOSIS — J209 Acute bronchitis, unspecified: Secondary | ICD-10-CM | POA: Diagnosis not present

## 2015-09-03 MED ORDER — IPRATROPIUM-ALBUTEROL 0.5-2.5 (3) MG/3ML IN SOLN: 3.0000 mL | Freq: Once | RESPIRATORY_TRACT | Status: DC

## 2015-09-03 MED ORDER — PREDNISONE 10 MG PO TABS: ORAL_TABLET | ORAL | 0 refills | Status: DC

## 2015-09-03 NOTE — Patient Instructions (Signed)
It was great meeting you today   Your exam is consistent with bronchitis.   I have sent in a prescription for prednisone. Take as directed.   Make sure you increase your water consumption to help keep the blood sugars low.   Follow up if no improvement

## 2015-09-03 NOTE — Progress Notes (Signed)
Subjective:    Patient ID: Kara Mead, male    DOB: 1945/11/30, 70 y.o.   MRN: [id number]  Cough  This is a new problem. The current episode started in the past 7 days. The problem has been unchanged. The problem occurs constantly. The cough is productive of sputum. Associated symptoms include shortness of breath and sweats. Pertinent negatives include no chills, fever, nasal congestion, rhinorrhea, sore throat or wheezing. The symptoms are aggravated by exercise. He has tried OTC cough suppressant for the symptoms. The treatment provided mild relief.      Review of Systems  Constitutional: Negative for chills and fever.  HENT: Positive for congestion. Negative for rhinorrhea, sore throat and trouble swallowing.   Respiratory: Positive for cough, chest tightness and shortness of breath. Negative for wheezing.   Neurological: Negative.    Past Medical History:  Diagnosis Date  . Colon polyps 12/09/2009   Multiple in descending colon  . Depression   . Diabetes mellitus    type 2  . Diverticulosis 12/09/2009   Moderate  . Hyperlipidemia   . Hypertension   . melanoma dx'd 12/2012   rt arm; surg   . Microalbuminuria     Social History   Social History  . Marital status: Married    Spouse name: N/A  . Number of children: 4  . Years of education: N/A   Occupational History  . 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 Smoking status: Former Smoker    Packs/day: 1.50    Years: 45.00    Types: Cigars, Cigarettes    Quit date: 09/13/1998  . Smokeless tobacco: Never Used  . Alcohol use 0.0 oz/week     Comment: very little/occ  . Drug use: No  . Sexual activity: No   Other Topics Concern  . Not on file   Social History Narrative   Married 4 kids. 6 grandkids.       Retired from [employer]: travel, reading    Past Surgical History:  Procedure Laterality Date  . MASS EXCISION  09/29/2011   [facility]-Excision sebaceous cyst on neck  .  MELANOMA EXCISION  12/2012  . TONSILLECTOMY AND ADENOIDECTOMY      Family History  Problem Relation Age of Onset  . Heart attack Mother     60 from chemo  . Breast cancer Mother     dx. <68; +chemo  . Hypertension Father   . Cerebral aneurysm Father   . BRCA 1/2 Maternal Aunt     never tested, but daughter has known mutation in BRCA2  . Kidney failure Maternal Aunt   . Stomach cancer Maternal Uncle     d. 93; smoker and issues with heartburn  . Heart Problems Paternal Grandfather   . BRCA 1/2 Cousin     BRCA2+; maternal 1st cousin; first person tested in family  . Breast cancer Cousin 73    Allergies  Allergen Reactions  . Amlodipine Swelling    Current Outpatient Prescriptions on File Prior to Visit  Medication Sig Dispense Refill  . ACCU-CHEK FASTCLIX LANCETS MISC Use to check blood sugar 2 times per day 200 each 2  . bumetanide (BUMEX) 2 MG tablet Take 1 tablet by mouth  daily 90 tablet 3  . CARDIZEM CD 360 MG 24 hr capsule Take 1 capsule by mouth  daily with breakfast 90 capsule 3  . Cholecalciferol (VITAMIN D-3) 1000 UNITS CAPS Take 2,000 Units by  mouth daily.    . cloNIDine (CATAPRES) 0.1 MG tablet Take 0.1 mg by mouth 2 (two) times daily.    . hydrALAZINE (APRESOLINE) 100 MG tablet Take 100 mg by mouth 2 (two) times daily.    . Insulin Glargine (LANTUS SOLOSTAR) 100 UNIT/ML Solostar Pen Inject 120 Units into the skin every morning. And pen needles 2/day 60 pen 3  . labetalol (NORMODYNE) 200 MG tablet Take 1 tablet by mouth two  times daily 180 tablet 3  . Lancet Devices (AUTOLET IMPRESSION) MISC You to check blood sugar 2 times a day 2 each 1  . LIPITOR 20 MG tablet TAKE 1 TABLET BY MOUTH  DAILY AT 6 PM. 90 tablet 3  . Multiple Vitamins-Minerals (CENTURY SENIOR) TABS Take 1 tablet by mouth daily.    . Omega-3 Fatty Acids (FISH OIL) 1200 MG CAPS Take 1,200 mg by mouth daily.    Marland Kitchen PARoxetine (PAXIL) 20 MG tablet Take 1 tablet by mouth  daily 90 tablet 2  . sildenafil  (VIAGRA) 100 MG tablet Take 1 tablet (100 mg total) by mouth daily as needed for erectile dysfunction (Single Pack requested. May only buy a few pills.). 10 tablet 5  . tadalafil (CIALIS) 20 MG tablet Take 0.5-1 tablets (10-20 mg total) by mouth every other day as needed for erectile dysfunction. 5 tablet 11  . valsartan (DIOVAN) 320 MG tablet Take 1 tablet by mouth  daily 90 tablet 3   No current facility-administered medications on file prior to visit.     BP (!) 142/70 (BP Location: Left Arm, Patient Position: Sitting, Cuff Size: Normal)   Temp 98.9 F (37.2 C) (Oral)   Ht _0  (1.626 m)   Wt 233 lb (105.7 kg)   BMI 39.99 kg/m       Objective:   Physical Exam  Constitutional: He is oriented to person, place, and time. He appears well-developed and well-nourished. No distress.  Cardiovascular: Normal rate, regular rhythm, normal heart sounds and intact distal pulses.  Exam reveals no gallop and no friction rub.   No murmur heard. Pulmonary/Chest: Effort normal. No respiratory distress. He has wheezes in the right upper field, the right lower field, the left upper field, the left middle field and the left lower field. He has rhonchi in the right upper field and the right middle field. He exhibits no tenderness.  Musculoskeletal: Normal range of motion. He exhibits no edema, tenderness or deformity.  Neurological: He is alert and oriented to person, place, and time.  Skin: Skin is warm and dry. No rash noted. He is not diaphoretic. No erythema. No pallor.  Psychiatric: He has a normal mood and affect. His behavior is normal. Judgment and thought content normal.  Nursing note and vitals reviewed.      Assessment & Plan:  1. Acute bronchitis, unspecified organism - ipratropium-albuterol (DUONEB) 0.5-2.5 (3) MG/3ML nebulizer solution 3 mL; Take 3 mLs by nebulization once. - predniSONE (DELTASONE) 10 MG tablet; 40 mg x 3 days, 20 mg x 3 days, 10 mg x 3 days  Dispense: 21 tablet;  Refill: 0 - Patient endorsed being able to breath easier after duo neb treatment. Lung sounds were clear on inspection.   Dorothyann Peng, NP

## 2015-09-03 NOTE — Progress Notes (Signed)
Pre visit review using our clinic review tool, if applicable. No additional management support is needed unless otherwise documented below in the visit note. 

## 2015-10-03 ENCOUNTER — Encounter: Payer: Self-pay | Admitting: Family Medicine

## 2015-11-07 ENCOUNTER — Telehealth: Payer: Self-pay | Admitting: Family Medicine

## 2015-11-07 NOTE — Telephone Encounter (Signed)
Called and left her a detailed voicemail message letting her know that pt has a diagnosis of Chronic diastolic heart failure ([place]). I did leave a return phone number for questions.

## 2015-11-07 NOTE — Telephone Encounter (Signed)
[employer] needs to know if pt has a dx of heart failure, Most recent EF Most recent  Pt has started in a heart failure program with [facility], but if he doesn't have this dx, then no reason for him to be in the program.  Pt does not have a cardiologist

## 2015-11-26 ENCOUNTER — Telehealth: Payer: Self-pay

## 2015-11-26 NOTE — Telephone Encounter (Signed)
Call to Mr. Paul [name] to set up an apt for AWV. STated he was going to the kidney doctor next week and then may need to fup with dr. Yong Channel. Told him he can call to schedule with Dr. Yong Channel and schedule the AWV for the same day;   Paul [name] will fup with Kidney specialist next week and then will call and schedule with [facility] and myself.

## 2015-12-07 NOTE — Progress Notes (Signed)
Subjective:    Patient ID: Paul [name], male    DOB: October 05, 1945, 70 y.o.   MRN: [id number]  HPI Pt returns for f/u of diabetes mellitus: DM type: Insulin-requiring type 2 Dx'ed: 3300 Complications: renal insufficiency and background retinopathy.   Therapy: insulin since 2009 DKA: never Severe hypoglycemia: never.   Pancreatitis: never.   Other: he takes qd insulin, after poor results with taking it more frequently; he declines weight loss surgery.   Interval history: he says his diet was poor in 1 month ago.  He increased insulin temporarily, but he has since reduced back to 120 QAM, and diet is better now.  He seldom has hypoglycemia, and these episodes are mild.  He does not check cbg's Past Medical History:  Diagnosis Date  . Colon polyps 12/09/2009   Multiple in descending colon  . Depression   . Diabetes mellitus    type 2  . Diverticulosis 12/09/2009   Moderate  . Hyperlipidemia   . Hypertension   . melanoma dx'd 12/2012   rt arm; surg   . Microalbuminuria     Past Surgical History:  Procedure Laterality Date  . MASS EXCISION  09/29/2011   [facility]-Excision sebaceous cyst on neck  . MELANOMA EXCISION  12/2012  . TONSILLECTOMY AND ADENOIDECTOMY      Social History   Social History  . Marital status: Married    Spouse name: N/A  . Number of children: 4  . Years of education: N/A   Occupational History  . 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 Smoking status: Former Smoker    Packs/day: 1.50    Years: 45.00    Types: Cigars, Cigarettes    Quit date: 09/13/1998  . Smokeless tobacco: Never Used  . Alcohol use 0.0 oz/week     Comment: very little/occ  . Drug use: No  . Sexual activity: No   Other Topics Concern  . Not on file   Social History Narrative   Married 4 kids. 6 grandkids.       Retired from [employer]: travel, reading    Current Outpatient Prescriptions on File Prior to Visit  Medication Sig Dispense  Refill  . ACCU-CHEK FASTCLIX LANCETS MISC Use to check blood sugar 2 times per day 200 each 2  . bumetanide (BUMEX) 2 MG tablet Take 1 tablet by mouth  daily (Patient taking differently: Take 2 times by mouth  daily) 90 tablet 3  . CARDIZEM CD 360 MG 24 hr capsule Take 1 capsule by mouth  daily with breakfast 90 capsule 3  . Cholecalciferol (VITAMIN D-3) 1000 UNITS CAPS Take 2,000 Units by mouth daily.    . cloNIDine (CATAPRES) 0.1 MG tablet Take 0.1 mg by mouth 2 (two) times daily.    . hydrALAZINE (APRESOLINE) 100 MG tablet Take 100 mg by mouth 3 (three) times daily.     . Insulin Glargine (LANTUS SOLOSTAR) 100 UNIT/ML Solostar Pen Inject 120 Units into the skin every morning. And pen needles 2/day 60 pen 3  . labetalol (NORMODYNE) 200 MG tablet Take 1 tablet by mouth two  times daily 180 tablet 3  . Lancet Devices (AUTOLET IMPRESSION) MISC You to check blood sugar 2 times a day 2 each 1  . LIPITOR 20 MG tablet TAKE 1 TABLET BY MOUTH  DAILY AT 6 PM. 90 tablet 3  . Multiple Vitamins-Minerals (CENTURY SENIOR) TABS Take 1 tablet by mouth daily.    Marland Kitchen  Omega-3 Fatty Acids (FISH OIL) 1200 MG CAPS Take 1,200 mg by mouth daily.    . PARoxetine (PAXIL) 20 MG tablet Take 1 tablet by mouth  daily 90 tablet 2  . predniSONE (DELTASONE) 10 MG tablet 40 mg x 3 days, 20 mg x 3 days, 10 mg x 3 days 21 tablet 0  . sildenafil (VIAGRA) 100 MG tablet Take 1 tablet (100 mg total) by mouth daily as needed for erectile dysfunction (Single Pack requested. May only buy a few pills.). 10 tablet 5  . tadalafil (CIALIS) 20 MG tablet Take 0.5-1 tablets (10-20 mg total) by mouth every other day as needed for erectile dysfunction. 5 tablet 11  . valsartan (DIOVAN) 320 MG tablet Take 1 tablet by mouth  daily 90 tablet 3   Current Facility-Administered Medications on File Prior to Visit  Medication Dose Route Frequency Provider Last Rate Last Dose  . ipratropium-albuterol (DUONEB) 0.5-2.5 (3) MG/3ML nebulizer solution 3 mL  3  mL Nebulization Once Cory Nafziger, NP        Allergies  Allergen Reactions  . Amlodipine Swelling    Family History  Problem Relation Age of Onset  . Heart attack Mother     72 from chemo  . Breast cancer Mother     dx. <68; +chemo  . Hypertension Father   . Cerebral aneurysm Father   . BRCA 1/2 Maternal Aunt     never tested, but daughter has known mutation in BRCA2  . Kidney failure Maternal Aunt   . Stomach cancer Maternal Uncle     d. 67; smoker and issues with heartburn  . Heart Problems Paternal Grandfather   . BRCA 1/2 Cousin     BRCA2+; maternal 1st cousin; first person tested in family  . Breast cancer Cousin 59    BP 130/82   Pulse 60   Ht 5' 4" (1.626 m)   Wt 234 lb (106.1 kg)   SpO2 94%   BMI 40.17 kg/m   Review of Systems Denies LOC.      Objectiv  e:   Physical Exam VITAL SIGNS:  See vs page GENERAL: no distress Pulses: dorsalis pedis intact bilat.   MSK: no deformity of the feet CV: 1+ bilat leg edema Skin:  no ulcer on the feet, but the skin is dry.  normal color and temp on the feet. Neuro: sensation is intact to touch on the feet.    A1c=8.0%    Assessment & Plan:  Insulin-requiring type 2 DM: worse per a1c.  We'll check fructosamine, as he says diet is better the past month or so.  Noncompliance with cbg recording: this complicates the rx of DM.  Patient is advised the following: Patient Instructions  Please continue the same insulin (120 units each morning).   check your blood sugar twice a day.  vary the time of day when you check, between before the 3 meals, and at bedtime.  also check if you have symptoms of your blood sugar being too high or too low.  please keep a record of the readings and bring it to your next appointment here.  please call us sooner if your blood sugar goes below 70, or if you have a lot of readings over 200.   On this type of insulin schedule, you should eat meals on a regular schedule.  If a meal is missed or  significantly delayed, your blood sugar could go low.   blood tests are requested for you today.    We'll let you know about the results.   Please come back for a follow-up appointment in 4 months.     

## 2015-12-08 ENCOUNTER — Encounter: Payer: Self-pay | Admitting: Endocrinology

## 2015-12-08 ENCOUNTER — Ambulatory Visit (INDEPENDENT_AMBULATORY_CARE_PROVIDER_SITE_OTHER): Payer: Medicare Other | Admitting: Endocrinology

## 2015-12-08 VITALS — BP 130/82 | HR 60 | Ht 64.0 in | Wt 234.0 lb

## 2015-12-08 DIAGNOSIS — E1122 Type 2 diabetes mellitus with diabetic chronic kidney disease: Secondary | ICD-10-CM

## 2015-12-08 DIAGNOSIS — N183 Chronic kidney disease, stage 3 (moderate): Secondary | ICD-10-CM

## 2015-12-08 DIAGNOSIS — Z794 Long term (current) use of insulin: Secondary | ICD-10-CM

## 2015-12-08 LAB — POCT GLYCOSYLATED HEMOGLOBIN (HGB A1C): Hemoglobin A1C: 8

## 2015-12-08 NOTE — Patient Instructions (Addendum)
Please continue the same insulin (120 units each morning).   check your blood sugar twice a day.  vary the time of day when you check, between before the 3 meals, and at bedtime.  also check if you have symptoms of your blood sugar being too high or too low.  please keep a record of the readings and bring it to your next appointment here.  please call us sooner if your blood sugar goes below 70, or if you have a lot of readings over 200.   On this type of insulin schedule, you should eat meals on a regular schedule.  If a meal is missed or significantly delayed, your blood sugar could go low.   blood tests are requested for you today.  We'll let you know about the results.   Please come back for a follow-up appointment in 4 months.

## 2015-12-10 LAB — FRUCTOSAMINE: FRUCTOSAMINE: 291 umol/L — AB (ref 190–270)

## 2016-03-23 ENCOUNTER — Other Ambulatory Visit: Payer: Self-pay | Admitting: Family Medicine

## 2016-03-31 ENCOUNTER — Encounter (HOSPITAL_COMMUNITY): Payer: Self-pay

## 2016-04-06 ENCOUNTER — Ambulatory Visit: Payer: Medicare Other | Admitting: Endocrinology

## 2016-04-28 ENCOUNTER — Ambulatory Visit: Payer: Medicare Other | Admitting: Endocrinology

## 2016-05-06 ENCOUNTER — Other Ambulatory Visit: Payer: Self-pay

## 2016-05-06 ENCOUNTER — Ambulatory Visit (INDEPENDENT_AMBULATORY_CARE_PROVIDER_SITE_OTHER): Payer: Medicare Other | Admitting: Endocrinology

## 2016-05-06 VITALS — BP 130/68 | HR 61 | Ht 64.0 in | Wt 229.8 lb

## 2016-05-06 DIAGNOSIS — N183 Chronic kidney disease, stage 3 unspecified: Secondary | ICD-10-CM

## 2016-05-06 DIAGNOSIS — Z794 Long term (current) use of insulin: Secondary | ICD-10-CM | POA: Diagnosis not present

## 2016-05-06 DIAGNOSIS — E1122 Type 2 diabetes mellitus with diabetic chronic kidney disease: Secondary | ICD-10-CM

## 2016-05-06 LAB — POCT GLYCOSYLATED HEMOGLOBIN (HGB A1C): Hemoglobin A1C: 8.9

## 2016-05-06 MED ORDER — INSULIN GLARGINE 100 UNIT/ML SOLOSTAR PEN: 120.0000 [IU] | PEN_INJECTOR | SUBCUTANEOUS | 3 refills | Status: DC

## 2016-05-06 MED ORDER — INSULIN PEN NEEDLE 31G X 8 MM MISC: 2 refills | Status: DC

## 2016-05-06 NOTE — Patient Instructions (Addendum)
Please increase the insulin to120 units each morning.   check your blood sugar twice a day.  vary the time of day when you check, between before the 3 meals, and at bedtime.  also check if you have symptoms of your blood sugar being too high or too low.  please keep a record of the readings and bring it to your next appointment here.  please call us sooner if your blood sugar goes below 70, or if you have a lot of readings over 200.   On this type of insulin schedule, you should eat meals on a regular schedule.  If a meal is missed or significantly delayed, your blood sugar could go low.   Please come back for a follow-up appointment in 3 months.

## 2016-05-06 NOTE — Progress Notes (Signed)
Subjective:    Patient ID: Paul [name], male    DOB: [birth year], 71 y.o.   MRN: [id number]  HPI Pt returns for f/u of diabetes mellitus: DM type: Insulin-requiring type 2 Dx'ed: 9417 Complications: renal insufficiency and background retinopathy.   Therapy: insulin since 2009 DKA: never Severe hypoglycemia: never.   Pancreatitis: never.   Other: he takes qd insulin, after poor results with taking it more frequently; he declines weight loss surgery.   Interval history: He seldom has hypoglycemia, and these episodes are mild.  He does not check cbg's.  He takes 110 units qam.  Past Medical History:  Diagnosis Date  . Colon polyps 12/09/2009   Multiple in descending colon  . Depression   . Diabetes mellitus    type 2  . Diverticulosis 12/09/2009   Moderate  . Hyperlipidemia   . Hypertension   . melanoma dx'd 12/2012   rt arm; surg   . Microalbuminuria     Past Surgical History:  Procedure Laterality Date  . MASS EXCISION  09/29/2011   [facility]-Excision sebaceous cyst on neck  . MELANOMA EXCISION  12/2012  . TONSILLECTOMY AND ADENOIDECTOMY      Social History   Social History  . Marital status: Married    Spouse name: N/A  . Number of children: 4  . Years of education: N/A   Occupational History  . 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 Smoking status: Former Smoker    Packs/day: 1.50    Years: 45.00    Types: Cigars, Cigarettes    Quit date: 09/13/1998  . Smokeless tobacco: Never Used  . Alcohol use 0.0 oz/week     Comment: very little/occ  . Drug use: No  . Sexual activity: No   Other Topics Concern  . Not on file   Social History Narrative   Married 4 kids. 6 grandkids.       Retired from [employer]: travel, reading    Current Outpatient Prescriptions on File Prior to Visit  Medication Sig Dispense Refill  . ACCU-CHEK FASTCLIX LANCETS MISC Use to check blood sugar 2 times per day 200 each 2  . bumetanide (BUMEX)  2 MG tablet Take 1 tablet by mouth  daily (Patient taking differently: Take 2 times by mouth  daily) 90 tablet 3  . CARDIZEM CD 360 MG 24 hr capsule Take 1 capsule by mouth  daily with breakfast 90 capsule 3  . Cholecalciferol (VITAMIN D-3) 1000 UNITS CAPS Take 2,000 Units by mouth daily.    . cloNIDine (CATAPRES) 0.1 MG tablet Take 0.2 mg by mouth 3 (three) times daily.     . hydrALAZINE (APRESOLINE) 100 MG tablet Take 100 mg by mouth 3 (three) times daily.     Marland Kitchen labetalol (NORMODYNE) 200 MG tablet Take 1 tablet by mouth two  times daily 180 tablet 3  . Lancet Devices (AUTOLET IMPRESSION) MISC You to check blood sugar 2 times a day 2 each 1  . LIPITOR 20 MG tablet TAKE 1 TABLET BY MOUTH  DAILY AT 6 PM. 90 tablet 3  . Multiple Vitamins-Minerals (CENTURY SENIOR) TABS Take 1 tablet by mouth daily.    . Omega-3 Fatty Acids (FISH OIL) 1200 MG CAPS Take 1,200 mg by mouth daily.    Marland Kitchen PARoxetine (PAXIL) 20 MG tablet TAKE 1 TABLET BY MOUTH  DAILY 90 tablet 1  . sildenafil (VIAGRA) 100 MG tablet Take 1 tablet (100 mg  total) by mouth daily as needed for erectile dysfunction (Single Pack requested. May only buy a few pills.). 10 tablet 5  . tadalafil (CIALIS) 20 MG tablet Take 0.5-1 tablets (10-20 mg total) by mouth every other day as needed for erectile dysfunction. 5 tablet 11  . valsartan (DIOVAN) 320 MG tablet Take 1 tablet by mouth  daily 90 tablet 3  . predniSONE (DELTASONE) 10 MG tablet 40 mg x 3 days, 20 mg x 3 days, 10 mg x 3 days (Patient not taking: Reported on 05/06/2016) 21 tablet 0   Current Facility-Administered Medications on File Prior to Visit  Medication Dose Route Frequency Provider Last Rate Last Dose  . ipratropium-albuterol (DUONEB) 0.5-2.5 (3) MG/3ML nebulizer solution 3 mL  3 mL Nebulization Once Dorothyann Peng, NP        Allergies  Allergen Reactions  . Amlodipine Swelling    Family History  Problem Relation Age of Onset  . Heart attack Mother     4 from chemo  . Breast  cancer Mother     dx. <68; +chemo  . Hypertension Father   . Cerebral aneurysm Father   . BRCA 1/2 Maternal Aunt     never tested, but daughter has known mutation in BRCA2  . Kidney failure Maternal Aunt   . Stomach cancer Maternal Uncle     d. 60; smoker and issues with heartburn  . Heart Problems Paternal Grandfather   . BRCA 1/2 Cousin     BRCA2+; maternal 1st cousin; first person tested in family  . Breast cancer Cousin 59    BP 130/68   Pulse 61   Ht '5\' 4"'$  (1.626 m)   Wt 229 lb 12.8 oz (104.2 kg)   SpO2 92%   BMI 39.45 kg/m    Review of Systems Denies LOC    Objective:   Physical Exam VITAL SIGNS:  See vs page GENERAL: no distress Pulses: dorsalis pedis intact bilat.   MSK: no deformity of the feet CV: 1+ bilat leg edema Skin:  no ulcer on the feet, but the skin is dry.  normal color and temp on the feet. Neuro: sensation is intact to touch on the feet.   Lab Results  Component Value Date   HGBA1C 8.9 05/06/2016      Assessment & Plan:  Insulin-requiring type 2 DM, with retinopathy: worse.  Noncompliance with cbg recording: we'll have to titrate based on a1c.  Renal insuff: he is at risk for hypoglycemia if a1c is <7%.   Patient Instructions  Please increase the insulin to120 units each morning.   check your blood sugar twice a day.  vary the time of day when you check, between before the 3 meals, and at bedtime.  also check if you have symptoms of your blood sugar being too high or too low.  please keep a record of the readings and bring it to your next appointment here.  please call us sooner if your blood sugar goes below 70, or if you have a lot of readings over 200.   On this type of insulin schedule, you should eat meals on a regular schedule.  If a meal is missed or significantly delayed, your blood sugar could go low.   Please come back for a follow-up appointment in 3 months.

## 2016-05-11 ENCOUNTER — Ambulatory Visit (INDEPENDENT_AMBULATORY_CARE_PROVIDER_SITE_OTHER): Payer: Medicare Other | Admitting: Family Medicine

## 2016-05-11 ENCOUNTER — Encounter: Payer: Self-pay | Admitting: Family Medicine

## 2016-05-11 VITALS — BP 138/72 | HR 63 | Temp 98.4°F | Ht 64.0 in | Wt 231.0 lb

## 2016-05-11 DIAGNOSIS — I350 Nonrheumatic aortic (valve) stenosis: Secondary | ICD-10-CM

## 2016-05-11 DIAGNOSIS — I10 Essential (primary) hypertension: Secondary | ICD-10-CM | POA: Diagnosis not present

## 2016-05-11 DIAGNOSIS — I5032 Chronic diastolic (congestive) heart failure: Secondary | ICD-10-CM

## 2016-05-11 DIAGNOSIS — E7800 Pure hypercholesterolemia, unspecified: Secondary | ICD-10-CM

## 2016-05-11 LAB — COMPREHENSIVE METABOLIC PANEL
ALBUMIN: 3.6 g/dL (ref 3.5–5.2)
ALK PHOS: 81 U/L (ref 39–117)
ALT: 44 U/L (ref 0–53)
AST: 34 U/L (ref 0–37)
BILIRUBIN TOTAL: 0.5 mg/dL (ref 0.2–1.2)
BUN: 24 mg/dL — AB (ref 6–23)
CALCIUM: 9.2 mg/dL (ref 8.4–10.5)
CO2: 31 mEq/L (ref 19–32)
CREATININE: 1.6 mg/dL — AB (ref 0.40–1.50)
Chloride: 103 mEq/L (ref 96–112)
GFR: 45.29 mL/min — ABNORMAL LOW (ref >60.00)
Glucose, Bld: 277 mg/dL — ABNORMAL HIGH (ref 70–99)
Potassium: 4.3 mEq/L (ref 3.5–5.1)
SODIUM: 139 meq/L (ref 135–145)
TOTAL PROTEIN: 6 g/dL (ref 6.0–8.3)

## 2016-05-11 LAB — CBC
HCT: 39.7 % (ref 39.0–52.0)
Hemoglobin: 13.2 g/dL (ref 13.0–17.0)
MCHC: 33.3 g/dL (ref 30.0–36.0)
MCV: 91.4 fl (ref 78.0–100.0)
PLATELETS: 159 10*3/uL (ref 150.0–400.0)
RBC: 4.34 Mil/uL (ref 4.22–5.81)
RDW: 14.2 % (ref 11.5–15.5)
WBC: 8.3 10*3/uL (ref 4.0–10.5)

## 2016-05-11 LAB — LIPID PANEL
CHOLESTEROL: 123 mg/dL (ref 0–200)
HDL: 33.5 mg/dL — ABNORMAL LOW (ref >39.00)
LDL Cholesterol: 51 mg/dL (ref 0–99)
NonHDL: 89.2
TRIGLYCERIDES: 190 mg/dL — AB (ref 0.0–149.0)
Total CHOL/HDL Ratio: 4
VLDL: 38 mg/dL (ref 0.0–40.0)

## 2016-05-11 NOTE — Assessment & Plan Note (Signed)
S: diastolic CHF noted- he has been compliant with bumex 2mg  BID with no signs of fluid overload A/P: continue current medicines

## 2016-05-11 NOTE — Progress Notes (Signed)
Subjective:  Paul [name] is a 71 y.o. year old very pleasant male patient who presents for/with See problem oriented charting ROS- No chest pain or shortness of breath. No headache or blurry vision. Stable edema   Past Medical History-  Patient Active Problem List   Diagnosis Date Noted  . 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 Chronic diastolic heart failure ([place]) 11/26/2013    Priority: High  . History of melanoma 12/18/2012    Priority: High  . Type 2 diabetes, controlled, with renal manifestation ([place]) 07/28/2006    Priority: High  . Essential hypertension 07/28/2006    Priority: High  . CKD (chronic kidney disease), stage III 09/19/2014    Priority: Medium  . Obstructive sleep apnea 03/27/2014    Priority: Medium  . Former smoker 01/02/2014    Priority: Medium  . Mild aortic stenosis 11/26/2013    Priority: Medium  . Hyperlipidemia 07/28/2006    Priority: Medium  . Depression 07/28/2006    Priority: Medium  . BRCA2 positive 02/06/2015    Priority: Low  . Genetic testing 02/06/2015    Priority: Low  . Diverticulosis of colon with hemorrhage 08/10/2013    Priority: Low  . Hx of adenomatous colonic polyps 08/10/2013    Priority: Low  . Erectile dysfunction 09/04/2009    Priority: Low  . Obesity 12/28/2007    Priority: Low  . Gout 06/28/2007    Priority: Low  . Anemia 12/27/2006    Priority: Low  . MICROALBUMINURIA 07/28/2006    Priority: Low    Medications- reviewed and updated Current Outpatient Prescriptions  Medication Sig Dispense Refill  . ACCU-CHEK FASTCLIX LANCETS MISC Use to check blood sugar 2 times per day 200 each 2  . bumetanide (BUMEX) 2 MG tablet Take 1 tablet by mouth  daily (Patient taking differently: Take 2 times by mouth  daily) 90 tablet 3  . CARDIZEM CD 360 MG 24 hr capsule Take 1 capsule by mouth  daily with breakfast 90 capsule 3  . Cholecalciferol (VITAMIN D-3) 1000 UNITS CAPS Take 2,000 Units by mouth daily.    .  hydrALAZINE (APRESOLINE) 100 MG tablet Take 100 mg by mouth 3 (three) times daily.     . Insulin Glargine (LANTUS SOLOSTAR) 100 UNIT/ML Solostar Pen Inject 120 Units into the skin every morning. And pen needles 2/day 60 pen 3  . Insulin Pen Needle 31G X 8 MM MISC Inject insulin two times a day. 200 each 2  . labetalol (NORMODYNE) 200 MG tablet Take 1 tablet by mouth two  times daily 180 tablet 3  . Lancet Devices (AUTOLET IMPRESSION) MISC You to check blood sugar 2 times a day 2 each 1  . LIPITOR 20 MG tablet TAKE 1 TABLET BY MOUTH  DAILY AT 6 PM. 90 tablet 3  . Multiple Vitamins-Minerals (CENTURY SENIOR) TABS Take 1 tablet by mouth daily.    . Omega-3 Fatty Acids (FISH OIL) 1200 MG CAPS Take 1,200 mg by mouth daily.    Marland Kitchen PARoxetine (PAXIL) 20 MG tablet TAKE 1 TABLET BY MOUTH  DAILY 90 tablet 1  . sildenafil (VIAGRA) 100 MG tablet Take 1 tablet (100 mg total) by mouth daily as needed for erectile dysfunction (Single Pack requested. May only buy a few pills.). 10 tablet 5  . tadalafil (CIALIS) 20 MG tablet Take 0.5-1 tablets (10-20 mg total) by mouth every other day as needed for erectile dysfunction. 5 tablet 11  . valsartan (DIOVAN)  320 MG tablet Take 1 tablet by mouth  daily 90 tablet 3  . cloNIDine (CATAPRES) 0.2 MG tablet Take 1 tablet by mouth 3 (three) times daily.     No current facility-administered medications for this visit.     Objective: BP 138/72 (BP Location: Left Arm, Patient Position: Sitting, Cuff Size: Large)   Pulse 63   Temp 98.4 F (36.9 C) (Oral)   Ht '5\' 4"'$  (1.626 m)   Wt 231 lb (104.8 kg)   SpO2 94%   BMI 39.65 kg/m  Gen: NAD, resting comfortably CV: RRR, 3/6 SEM Lungs: CTAB no crackles, wheeze, rhonchi Abdomen: soft/nontender/nondistended/normal bowel sounds. No rebound or guarding.  Ext: 1+ stable edema Skin: warm, dry  Assessment/Plan:  Chronic diastolic heart failure S: diastolic CHF noted- he has been compliant with bumex '2mg'$  BID with no signs of fluid  overload A/P: continue current medicines  Essential hypertension S: controlled on  bumex '2mg'$  BID , diltiazem '360mg'$  XR(move to evening per last renal note), clonidine 0.'2mg'$  TD,  hydralazine TID, labetalol '200mg'$  BID, valsartan '320mg'$  (keep in AM per last renal notes). Managed by renal BP Readings from Last 3 Encounters:  05/11/16 138/72  05/06/16 130/68  12/08/15 130/82  A/P:Continue current meds:   Working on eating more veggies, watching salt, eating more fish . Discussed importance of weight loss. Home readings 130-150s- will bring cuff to next visit.   Mild aortic stenosis S: mild aortic stenosis noted 11/2013 A/P: approaching 3 years out- discussed updating in the fall  Return in about 3 months (around 08/10/2016) for physical.  Orders Placed This Encounter  Procedures  . CBC    [place]  . Comprehensive metabolic panel    [place]    Order Specific Question:   Has the patient fasted?    Answer:   No  . Lipid panel    [place]    Order Specific Question:   Has the patient fasted?    Answer:   No    Meds ordered this encounter  Medications  . cloNIDine (CATAPRES) 0.2 MG tablet    Sig: Take 1 tablet by mouth 3 (three) times daily.   Return precautions advised.  Garret Reddish, MD

## 2016-05-11 NOTE — Assessment & Plan Note (Addendum)
S: controlled on  bumex 2mg  BID , diltiazem 360mg  XR(move to evening per last renal note), clonidine 0.2mg  TD,  hydralazine TID, labetalol 200mg  BID, valsartan 320mg  (keep in AM per last renal notes). Managed by renal BP Readings from Last 3 Encounters:  05/11/16 138/72  05/06/16 130/68  12/08/15 130/82  A/P:Continue current meds:   Working on eating more veggies, watching salt, eating more fish . Discussed importance of weight loss. Home readings 130-150s- will bring cuff to next visit.

## 2016-05-11 NOTE — Assessment & Plan Note (Signed)
S: mild aortic stenosis noted 11/2013 A/P: approaching 3 years out- discussed updating in the fall

## 2016-05-11 NOTE — Progress Notes (Signed)
Pre visit review using our clinic review tool, if applicable. No additional management support is needed unless otherwise documented below in the visit note. 

## 2016-05-11 NOTE — Patient Instructions (Addendum)
If you have had your eye exam within the last year, please sign release of information at check out desk. If you have not had an eye exam within a year, please get one at this time as this is important for your diabetes care  Weight loss is key to helping control blood pressure- continue your efforts- we could always refer to nutrition or diabetes educatoin if you would like.   It has been about 3 years since your last echocardiogram- I would consider repeating in about 6 months  Bring your cuff with you to next visit  I would also like for you to sign up for an annual wellness visit with our nurse, [name], who specializes in the annual wellness exam. This is a free benefit under medicare that may help [place] find additional ways to help you. Some highlights are reviewing medications, lifestyle, and doing a dementia screen.

## 2016-05-25 ENCOUNTER — Ambulatory Visit (INDEPENDENT_AMBULATORY_CARE_PROVIDER_SITE_OTHER): Payer: Medicare Other

## 2016-05-25 VITALS — BP 136/80 | HR 72 | Ht 65.0 in | Wt 228.2 lb

## 2016-05-25 DIAGNOSIS — Z0000 Encounter for general adult medical examination without abnormal findings: Secondary | ICD-10-CM

## 2016-05-25 NOTE — Progress Notes (Signed)
I have reviewed and agree with note, evaluation, plan. Thrilled he is working on weight loss.   Garret Reddish, MD

## 2016-05-25 NOTE — Progress Notes (Signed)
Subjective:   Paul [name] is a 71 y.o. male who presents for Medicare Annual/Subsequent preventive examination.  The Patient was informed that the wellness visit is to identify future health risk and educate and initiate measures that can reduce risk for increased disease through the lifespan.    NO ROS; Medicare Wellness Visit Describes health as good, fair or great? Not great due to DM and high BP Father died 77;  dtr had stillbirth 5 months in March  His wife is taking care of her mother and is not home until the end of May; He is spending time "tweaking his diet".      Preventive Screening -Counseling & Management  Hx  rectal bleed - colonoscopy 09/2013 will repeat 2020  Smoking history- Former smoker Quit 2000; > than 71 yo  67.5 pack years  Does not qualify for LDCT due to quit date  AAA; had CT of pelvis:  "abd in 2015-abdominal aorta is normal caliber" (07/2013)  Smokeless tobacco no Second Hand Smoke status; No Smokers in the home ETOH - YES ;   Medication adherence or issues? Taking Bumex as directed  No issues   RISK FACTORS BMI 37 but on his home scale this am weighed 224 Was 230 last month;   Diet Eating less; Breakfast; oatmeal; tablespoon of peanut butter and honey and cinnamon  Son is a vegan/ he is considering vegan diet and trying to incorporate plant based recipes  Eating fish; salads  Educated on the benefits for kidneys, DM and cholesterol  Goes to whole foods; salmon wrapped in parchment and bakes at home  Oil and vinegar for salad dressing   Regular exercise  Was going to the gym until his dtr's miscarriage Now walking on the treadmill 1 to 2 days  Tried pickle ball HDL 33; encouraging exercise   BS was 8.9  Discussed 30 minutes of brisk exercise and 10lbs of weight loss would assist with reducing a1c   Cardiac Risk Factors:  Advanced aged > 39 in men;  Hyperlipidemia - Trig 190; HDL 33;  Diabetes 8.9  Family History - MI; Breast  cancer; HTN; Cerebral aneurysm  Other family had Heart problems, stomach cancer Obesity working weight   Fall risk - no  Given education on "Fall Prevention in the Home" for more safety tips the patient can apply as appropriate.  Long term goal is to "age in place" as he they manage will and is big enough for the family on holidays  Mobility of Functional changes this year? No Lives in 2 level home; bedroom downstairs  Bathroom has shower;   Safety; community yes wears sunscreen, yes he wears sunscreen  Hx of melanoma;  Keep in a place for firearms- does not exist  Motor vehicle accidents; no    Mental Health:  Any emotional problems? Anxious, depressed, irritable, sad or blue? no Denies feeling depressed or hopeless; voices pleasure in daily life How many social activities have you been engaged in within the last 2 weeks? No Gets depressed when alone This week he visited his dtr and 3 grands in [place] without diffiuculty    Activities of Daily Living - See functional screen   Cognitive testing; Ad8 score; 0 or less than 2  MMSE deferred or completed if AD8 + 2 issues  Advanced Directives  Patient Care Team: Marin Olp, MD as PCP - General (Family Medicine)   Immunization History  Administered Date(s) Administered  . Influenza Split  10/18/2011  . Influenza Whole 10/19/2006, 10/18/2009  . Influenza, High Dose Seasonal PF 10/31/2012  . Influenza,inj,Quad PF,36+ Mos 10/22/2013, 09/19/2014  . Pneumococcal Conjugate-13 10/22/2013  . Pneumococcal Polysaccharide-23 09/11/2008  . Tdap 10/07/2010  . Zoster 10/15/2010   Required Immunizations needed today  Screening test up to date or reviewed for plan of completion There are no preventive care reminders to display for this patient.  Cardiac Risk Factors include: advanced age (>18mn, >>56women);dyslipidemia;diabetes mellitus;family history of premature cardiovascular disease;hypertension;male  gender;obesity (BMI >30kg/m2)     Objective:    Vitals: BP 136/80   Pulse 72   Ht '5\' 5"'$  (1.651 m)   Wt 228 lb 3.2 oz (103.5 kg)   SpO2 93%   BMI 37.97 kg/m   Body mass index is 37.97 kg/m.  Tobacco History  Smoking Status  . Former Smoker  . Packs/day: 1.50  . Years: 45.00  . Types: Cigars, Cigarettes  . Quit date: 09/13/1998  Smokeless Tobacco  . Never Used     Counseling given: Yes   Past Medical History:  Diagnosis Date  . Colon polyps 12/09/2009   Multiple in descending colon  . Depression   . Diabetes mellitus    type 2  . Diverticulosis 12/09/2009   Moderate  . Hyperlipidemia   . Hypertension   . melanoma dx'd 12/2012   rt arm; surg   . Microalbuminuria    Past Surgical History:  Procedure Laterality Date  . MASS EXCISION  09/29/2011   [facility]-Excision sebaceous cyst on neck  . MELANOMA EXCISION  12/2012  . TONSILLECTOMY AND ADENOIDECTOMY     Family History  Problem Relation Age of Onset  . Heart attack Mother     719from chemo  . Breast cancer Mother     dx. <68; +chemo  . Hypertension Father   . Cerebral aneurysm Father   . BRCA 1/2 Maternal Aunt     never tested, but daughter has known mutation in BRCA2  . Kidney failure Maternal Aunt   . Stomach cancer Maternal Uncle     d. 649 smoker and issues with heartburn  . Heart Problems Paternal Grandfather   . BRCA 1/2 Cousin     BRCA2+; maternal 1st cousin; first person tested in family  . Breast cancer Cousin 571  History  Sexual Activity  . Sexual activity: No    Outpatient Encounter Prescriptions as of 05/25/2016  Medication Sig  . ACCU-CHEK FASTCLIX LANCETS MISC Use to check blood sugar 2 times per day  . bumetanide (BUMEX) 2 MG tablet Take 1 tablet by mouth  daily (Patient taking differently: Take 2 times by mouth  daily)  . CARDIZEM CD 360 MG 24 hr capsule Take 1 capsule by mouth  daily with breakfast  . Cholecalciferol (VITAMIN D-3) 1000 UNITS CAPS Take 2,000 Units by mouth daily.  .  cloNIDine (CATAPRES) 0.2 MG tablet Take 1 tablet by mouth 3 (three) times daily.  . hydrALAZINE (APRESOLINE) 100 MG tablet Take 100 mg by mouth 3 (three) times daily.   . Insulin Glargine (LANTUS SOLOSTAR) 100 UNIT/ML Solostar Pen Inject 120 Units into the skin every morning. And pen needles 2/day  . Insulin Pen Needle 31G X 8 MM MISC Inject insulin two times a day.  . labetalol (NORMODYNE) 200 MG tablet Take 1 tablet by mouth two  times daily  . Lancet Devices (AUTOLET IMPRESSION) MISC You to check blood sugar 2 times a day  . LIPITOR 20 MG tablet TAKE  1 TABLET BY MOUTH  DAILY AT 6 PM.  . Multiple Vitamins-Minerals (CENTURY SENIOR) TABS Take 1 tablet by mouth daily.  . Omega-3 Fatty Acids (FISH OIL) 1200 MG CAPS Take 1,200 mg by mouth daily.  Marland Kitchen PARoxetine (PAXIL) 20 MG tablet TAKE 1 TABLET BY MOUTH  DAILY  . sildenafil (VIAGRA) 100 MG tablet Take 1 tablet (100 mg total) by mouth daily as needed for erectile dysfunction (Single Pack requested. May only buy a few pills.).  Marland Kitchen tadalafil (CIALIS) 20 MG tablet Take 0.5-1 tablets (10-20 mg total) by mouth every other day as needed for erectile dysfunction.  . valsartan (DIOVAN) 320 MG tablet Take 1 tablet by mouth  daily   No facility-administered encounter medications on file as of 05/25/2016.     Activities of Daily Living In your present state of health, do you have any difficulty performing the following activities: 05/25/2016  Hearing? Y  Vision? N  Difficulty concentrating or making decisions? N  Walking or climbing stairs? N  Dressing or bathing? N  Doing errands, shopping? N  Preparing Food and eating ? N  Using the Toilet? N  In the past six months, have you accidently leaked urine? N  Do you have problems with loss of bowel control? N  Managing your Medications? N  Managing your Finances? N  Housekeeping or managing your Housekeeping? N  Some recent data might be hidden    Patient Care Team: Shelva Majestic, MD as PCP - General  (Family Medicine)   Assessment:     Exercise Activities and Dietary recommendations Current Exercise Habits: Home exercise routine, Type of exercise: treadmill, Time (Minutes): 30, Frequency (Times/Week): 5, Weekly Exercise (Minutes/Week): 150, Intensity: Moderate  Goals    . Weight (lb) < 200 lb (90.7 kg)          Keep it up Try to incorporate  the plant based food  Drink water!!   Check out  online nutrition programs as WikiBlast.com.cy and LimitLaws.com.cy; fit22me; Look for foods with "whole" wheat; bran; oatmeal etc Shot at the farmer's markets in season for fresher choices  Watch for "hydrogenated" on the label of oils which are trans-fats.  Watch for "high fructose corn syrup" in snacks, yogurt or ketchup  Meats have less marbling; bright colored fruits and vegetables;  Canned; dump out liquid and wash vegetables. Be mindful of what we are eating  Portion control is essential to a health weight! Sit down; take a break and enjoy your meal; take smaller bites; put the fork down between bites;  It takes 20 minutes to get full; so check in with your fullness cues and stop eating when you start to fill full             Fall Risk Fall Risk  05/25/2016 05/11/2016 09/19/2014 04/30/2013  Falls in the past year? No No No No   Depression Screen PHQ 2/9 Scores 05/25/2016 05/11/2016 09/19/2014 04/30/2013  PHQ - 2 Score 0 1 0 0    Cognitive Function; no issues with daily living         Immunization History  Administered Date(s) Administered  . Influenza Split 10/18/2011  . Influenza Whole 10/19/2006, 10/18/2009  . Influenza, High Dose Seasonal PF 10/31/2012  . Influenza,inj,Quad PF,36+ Mos 10/22/2013, 09/19/2014  . Pneumococcal Conjugate-13 10/22/2013  . Pneumococcal Polysaccharide-23 09/11/2008  . Tdap 10/07/2010  . Zoster 10/15/2010   Screening Tests Health Maintenance  Topic Date Due  . OPHTHALMOLOGY EXAM  07/17/2016 (Originally 02/13/2016)  .  INFLUENZA VACCINE   08/18/2016  . HEMOGLOBIN A1C  11/05/2016  . FOOT EXAM  05/06/2017  . COLONOSCOPY  09/27/2018  . TETANUS/TDAP  10/06/2020  . Hepatitis C Screening  Addressed  . PNA vac Low Risk Adult  Completed      Plan:      PCP Notes  Health Maintenance Eye exam postponed due to dtr's miscarriage;  Is scheduled for the next month  Long smoking hx but > 15 years from quit date Ct of abd and pelvis completed in 2015;  Educated regarding shingles vaccine  Abnormal Screens  Hearing; Hearing aid not working Given resources for free hearing aid   Referrals   Patient concerns; Diet and is focused on incorporating more plant food;  Reviewed chol; kidney and sodium education;  Is losing weight and fixing himself health meals   Nurse Concerns; none  Next PCP apt 08/2015   I have personally reviewed and noted the following in the patient's chart:   . Medical and social history . Use of alcohol, tobacco or illicit drugs  . Current medications and supplements . Functional ability and status . Nutritional status . Physical activity . Advanced directives . List of other physicians . Hospitalizations, surgeries, and ER visits in previous 12 months . Vitals . Screenings to include cognitive, depression, and falls . Referrals and appointments  In addition, I have reviewed and discussed with patient certain preventive protocols, quality metrics, and best practice recommendations. A written personalized care plan for preventive services as well as general preventive health recommendations were provided to patient.     Wynetta Fines, RN  05/25/2016

## 2016-05-25 NOTE — Patient Instructions (Addendum)
Mr. Paul [name] , Thank you for taking time to come for your Medicare Wellness Visit. I appreciate your ongoing commitment to your health goals. Please review the following plan we discussed and let me know if I can assist you in the future.   Deaf & Hard of Hearing Division Services - can assist with hearing aid x 1  No reviews  [facility]  [address]  [phone] - [place] Amedeo Plenty   Will fup on eye visit soon   Shingrix is a vaccine for the prevention of Shingles in Adults 50 and older.  If you are on Medicare, you can request a prescription from your doctor to be filled at a pharmacy.  Please check with your benefits regarding applicable copays or out of pocket expenses.  The Shingrix is given in 2 vaccines approx 8 weeks apart. You must receive the 2nd dose prior to 6 months from receipt of the first.    These are the goals we discussed: Goals    . Weight (lb) < 200 lb (90.7 kg)          Keep it up Try to incorporate  the plant based food  Drink water!!   Check out  online nutrition programs as GumSearch.nl and http://vang.com/; fit63me; Look for foods with "whole" wheat; bran; oatmeal etc Shot at the farmer's markets in season for fresher choices  Watch for "hydrogenated" on the label of oils which are trans-fats.  Watch for "high fructose corn syrup" in snacks, yogurt or ketchup  Meats have less marbling; bright colored fruits and vegetables;  Canned; dump out liquid and wash vegetables. Be mindful of what we are eating  Portion control is essential to a health weight! Sit down; take a break and enjoy your meal; take smaller bites; put the fork down between bites;  It takes 20 minutes to get full; so check in with your fullness cues and stop eating when you start to fill full              This is a list of the screening recommended for you and due dates:  Health Maintenance  Topic Date Due  . Eye exam for diabetics  02/13/2016  . Flu Shot   08/18/2016  . Hemoglobin A1C  11/05/2016  . Complete foot exam   05/06/2017  . Colon Cancer Screening  09/27/2018  . Tetanus Vaccine  10/06/2020  .  Hepatitis C: One time screening is recommended by Center for Disease Control  (CDC) for  adults born from [birth year] through 1967.   Addressed  . Pneumonia vaccines  Completed     DASH Eating Plan DASH stands for "Dietary Approaches to Stop Hypertension." The DASH eating plan is a healthy eating plan that has been shown to reduce high blood pressure (hypertension). It may also reduce your risk for type 2 diabetes, heart disease, and stroke. The DASH eating plan may also help with weight loss. What are tips for following this plan? General guidelines   Avoid eating more than 2,300 mg (milligrams) of salt (sodium) a day. If you have hypertension, you may need to reduce your sodium intake to 1,500 mg a day.  Limit alcohol intake to no more than 1 drink a day for nonpregnant women and 2 drinks a day for men. One drink equals 12 oz of beer, 5 oz of wine, or 1 oz of hard liquor.  Work with your health care provider to maintain a healthy body weight or  to lose weight. Ask what an ideal weight is for you.  Get at least 30 minutes of exercise that causes your heart to beat faster (aerobic exercise) most days of the week. Activities may include walking, swimming, or biking.  Work with your health care provider or diet and nutrition specialist (dietitian) to adjust your eating plan to your individual calorie needs. Reading food labels   Check food labels for the amount of sodium per serving. Choose foods with less than 5 percent of the Daily Value of sodium. Generally, foods with less than 300 mg of sodium per serving fit into this eating plan.  To find whole grains, look for the word "whole" as the first word in the ingredient list. Shopping   Buy products labeled as "low-sodium" or "no salt added."  Buy fresh foods. Avoid canned foods and premade or  frozen meals. Cooking   Avoid adding salt when cooking. Use salt-free seasonings or herbs instead of table salt or sea salt. Check with your health care provider or pharmacist before using salt substitutes.  Do not fry foods. Cook foods using healthy methods such as baking, boiling, grilling, and broiling instead.  Cook with heart-healthy oils, such as olive, canola, soybean, or sunflower oil. Meal planning    Eat a balanced diet that includes:  5 or more servings of fruits and vegetables each day. At each meal, try to fill half of your plate with fruits and vegetables.  Up to 6-8 servings of whole grains each day.  Less than 6 oz of lean meat, poultry, or fish each day. A 3-oz serving of meat is about the same size as a deck of cards. One egg equals 1 oz.  2 servings of low-fat dairy each day.  A serving of nuts, seeds, or beans 5 times each week.  Heart-healthy fats. Healthy fats called Omega-3 fatty acids are found in foods such as flaxseeds and coldwater fish, like sardines, salmon, and mackerel.  Limit how much you eat of the following:  Canned or prepackaged foods.  Food that is high in trans fat, such as fried foods.  Food that is high in saturated fat, such as fatty meat.  Sweets, desserts, sugary drinks, and other foods with added sugar.  Full-fat dairy products.  Do not salt foods before eating.  Try to eat at least 2 vegetarian meals each week.  Eat more home-cooked food and less restaurant, buffet, and fast food.  When eating at a restaurant, ask that your food be prepared with less salt or no salt, if possible. What foods are recommended? The items listed may not be a complete list. Talk with your dietitian about what dietary choices are best for you. Grains  Whole-grain or whole-wheat bread. Whole-grain or whole-wheat pasta. Brown rice. [place]. Bulgur. Whole-grain and low-sodium cereals. Pita bread. Low-fat, low-sodium crackers. Whole-wheat flour  tortillas. Vegetables  Fresh or frozen vegetables (raw, steamed, roasted, or grilled). Low-sodium or reduced-sodium tomato and vegetable juice. Low-sodium or reduced-sodium tomato sauce and tomato paste. Low-sodium or reduced-sodium canned vegetables. Fruits  All fresh, dried, or frozen fruit. Canned fruit in natural juice (without added sugar). Meat and other protein foods  Skinless chicken or [place]. Ground chicken or [place]. Pork with fat trimmed off. Fish and seafood. Egg whites. Dried beans, peas, or lentils. Unsalted nuts, nut butters, and seeds. Unsalted canned beans. Lean cuts of beef with fat trimmed off. Low-sodium, lean deli meat. Dairy  Low-fat (1%) or fat-free (skim) milk. Fat-free, low-fat, or  reduced-fat cheeses. Nonfat, low-sodium ricotta or cottage cheese. Low-fat or nonfat yogurt. Low-fat, low-sodium cheese. Fats and oils  Soft margarine without trans fats. Vegetable oil. Low-fat, reduced-fat, or light mayonnaise and salad dressings (reduced-sodium). Canola, safflower, olive, soybean, and sunflower oils. Avocado. Seasoning and other foods  Herbs. Spices. Seasoning mixes without salt. Unsalted popcorn and pretzels. Fat-free sweets. What foods are not recommended? The items listed may not be a complete list. Talk with your dietitian about what dietary choices are best for you. Grains  Baked goods made with fat, such as croissants, muffins, or some breads. Dry pasta or rice meal packs. Vegetables  Creamed or fried vegetables. Vegetables in a cheese sauce. Regular canned vegetables (not low-sodium or reduced-sodium). Regular canned tomato sauce and paste (not low-sodium or reduced-sodium). Regular tomato and vegetable juice (not low-sodium or reduced-sodium). Angie Fava. Olives. Fruits  Canned fruit in a light or heavy syrup. Fried fruit. Fruit in cream or butter sauce. Meat and other protein foods  Fatty cuts of meat. Ribs. Fried meat. Berniece Salines. Sausage. Bologna and other processed  lunch meats. Salami. Fatback. Hotdogs. Bratwurst. Salted nuts and seeds. Canned beans with added salt. Canned or smoked fish. Whole eggs or egg yolks. Chicken or [place] with skin. Dairy  Whole or 2% milk, cream, and half-and-half. Whole or full-fat cream cheese. Whole-fat or sweetened yogurt. Full-fat cheese. Nondairy creamers. Whipped toppings. Processed cheese and cheese spreads. Fats and oils  Butter. Stick margarine. Lard. Shortening. Ghee. Bacon fat. Tropical oils, such as coconut, palm kernel, or palm oil. Seasoning and other foods  Salted popcorn and pretzels. Onion salt, garlic salt, seasoned salt, table salt, and sea salt. Worcestershire sauce. Tartar sauce. Barbecue sauce. Teriyaki sauce. Soy sauce, including reduced-sodium. Steak sauce. Canned and packaged gravies. Fish sauce. Oyster sauce. Cocktail sauce. Horseradish that you find on the shelf. Ketchup. Mustard. Meat flavorings and tenderizers. Bouillon cubes. Hot sauce and Tabasco sauce. Premade or packaged marinades. Premade or packaged taco seasonings. Relishes. Regular salad dressings. Where to find more information:  National Heart, Lung, and [place]: https://wilson-eaton.com/  American Heart Association: www.heart.org Summary  The DASH eating plan is a healthy eating plan that has been shown to reduce high blood pressure (hypertension). It may also reduce your risk for type 2 diabetes, heart disease, and stroke.  With the DASH eating plan, you should limit salt (sodium) intake to 2,300 mg a day. If you have hypertension, you may need to reduce your sodium intake to 1,500 mg a day.  When on the DASH eating plan, aim to eat more fresh fruits and vegetables, whole grains, lean proteins, low-fat dairy, and heart-healthy fats.  Work with your health care provider or diet and nutrition specialist (dietitian) to adjust your eating plan to your individual calorie needs. This information is not intended to replace advice given to you by  your health care provider. Make sure you discuss any questions you have with your health care provider. Document Released: 12/24/2010 Document Revised: 12/29/2015 Document Reviewed: 12/29/2015 Elsevier Interactive Patient Education  2017 [place] Prevention in the Home Falls can cause injuries. They can happen to people of all ages. There are many things you can do to make your home safe and to help prevent falls. What can I do on the outside of my home?  Regularly fix the edges of walkways and driveways and fix any cracks.  Remove anything that might make you trip as you walk through a door, such as a raised  step or threshold.  Trim any bushes or trees on the path to your home.  Use bright outdoor lighting.  Clear any walking paths of anything that might make someone trip, such as rocks or tools.  Regularly check to see if handrails are loose or broken. Make sure that both sides of any steps have handrails.  Any raised decks and porches should have guardrails on the edges.  Have any leaves, snow, or ice cleared regularly.  Use sand or salt on walking paths during winter.  Clean up any spills in your garage right away. This includes oil or grease spills. What can I do in the bathroom?  Use night lights.  Install grab bars by the toilet and in the tub and shower. Do not use towel bars as grab bars.  Use non-skid mats or decals in the tub or shower.  If you need to sit down in the shower, use a plastic, non-slip stool.  Keep the floor dry. Clean up any water that spills on the floor as soon as it happens.  Remove soap buildup in the tub or shower regularly.  Attach bath mats securely with double-sided non-slip rug tape.  Do not have throw rugs and other things on the floor that can make you trip. What can I do in the bedroom?  Use night lights.  Make sure that you have a light by your bed that is easy to reach.  Do not use any sheets or blankets that  are too big for your bed. They should not hang down onto the floor.  Have a firm chair that has side arms. You can use this for support while you get dressed.  Do not have throw rugs and other things on the floor that can make you trip. What can I do in the kitchen?  Clean up any spills right away.  Avoid walking on wet floors.  Keep items that you use a lot in easy-to-reach places.  If you need to reach something above you, use a strong step stool that has a grab bar.  Keep electrical cords out of the way.  Do not use floor polish or wax that makes floors slippery. If you must use wax, use non-skid floor wax.  Do not have throw rugs and other things on the floor that can make you trip. What can I do with my stairs?  Do not leave any items on the stairs.  Make sure that there are handrails on both sides of the stairs and use them. Fix handrails that are broken or loose. Make sure that handrails are as long as the stairways.  Check any carpeting to make sure that it is firmly attached to the stairs. Fix any carpet that is loose or worn.  Avoid having throw rugs at the top or bottom of the stairs. If you do have throw rugs, attach them to the floor with carpet tape.  Make sure that you have a light switch at the top of the stairs and the bottom of the stairs. If you do not have them, ask someone to add them for you. What else can I do to help prevent falls?  Wear shoes that:  Do not have high heels.  Have rubber bottoms.  Are comfortable and fit you well.  Are closed at the toe. Do not wear sandals.  If you use a stepladder:  Make sure that it is fully opened. Do not climb a closed stepladder.  Make sure that both  sides of the stepladder are locked into place.  Ask someone to hold it for you, if possible.  Clearly mark and make sure that you can see:  Any grab bars or handrails.  First and last steps.  Where the edge of each step is.  Use tools that help you  move around (mobility aids) if they are needed. These include:  Canes.  Walkers.  Scooters.  Crutches.  Turn on the lights when you go into a dark area. Replace any light bulbs as soon as they burn out.  Set up your furniture so you have a clear path. Avoid moving your furniture around.  If any of your floors are uneven, fix them.  If there are any pets around you, be aware of where they are.  Review your medicines with your doctor. Some medicines can make you feel dizzy. This can increase your chance of falling. Ask your doctor what other things that you can do to help prevent falls. This information is not intended to replace advice given to you by your health care provider. Make sure you discuss any questions you have with your health care provider. Document Released: 10/31/2008 Document Revised: 06/12/2015 Document Reviewed: 02/08/2014 Elsevier Interactive Patient Education  2017 [place] Maintenance, Male A healthy lifestyle and preventive care is important for your health and wellness. Ask your health care provider about what schedule of regular examinations is right for you. What should I know about weight and diet?  Eat a Healthy Diet  Eat plenty of vegetables, fruits, whole grains, low-fat dairy products, and lean protein.  Do not eat a lot of foods high in solid fats, added sugars, or salt. Maintain a Healthy Weight  Regular exercise can help you achieve or maintain a healthy weight. You should:  Do at least 150 minutes of exercise each week. The exercise should increase your heart rate and make you sweat (moderate-intensity exercise).  Do strength-training exercises at least twice a week. Watch Your Levels of Cholesterol and Blood Lipids  Have your blood tested for lipids and cholesterol every 5 years starting at 73 years of age. If you are at high risk for heart disease, you should start having your blood tested when you are 73 years old. You may  need to have your cholesterol levels checked more often if:  Your lipid or cholesterol levels are high.  You are older than 73 years of age.  You are at high risk for heart disease. What should I know about cancer screening? Many types of cancers can be detected early and may often be prevented. Lung Cancer  You should be screened every year for lung cancer if:  You are a current smoker who has smoked for at least 30 years.  You are a former smoker who has quit within the past 15 years.  Talk to your health care provider about your screening options, when you should start screening, and how often you should be screened. Colorectal Cancer  Routine colorectal cancer screening usually begins at 73 years of age and should be repeated every 5-10 years until you are 73 years old. You may need to be screened more often if early forms of precancerous polyps or small growths are found. Your health care provider may recommend screening at an earlier age if you have risk factors for colon cancer.  Your health care provider may recommend using home test kits to check for hidden blood in the stool.  A small camera  at the end of a tube can be used to examine your colon (sigmoidoscopy or colonoscopy). This checks for the earliest forms of colorectal cancer. Prostate and Testicular Cancer  Depending on your age and overall health, your health care provider may do certain tests to screen for prostate and testicular cancer.  Talk to your health care provider about any symptoms or concerns you have about testicular or prostate cancer. Skin Cancer  Check your skin from head to toe regularly.  Tell your health care provider about any new moles or changes in moles, especially if:  There is a change in a mole's size, shape, or color.  You have a mole that is larger than a pencil eraser.  Always use sunscreen. Apply sunscreen liberally and repeat throughout the day.  Protect yourself by wearing  long sleeves, pants, a wide-brimmed hat, and sunglasses when outside. What should I know about heart disease, diabetes, and high blood pressure?  If you are 102-55 years of age, have your blood pressure checked every 3-5 years. If you are 28 years of age or older, have your blood pressure checked every year. You should have your blood pressure measured twice-once when you are at a hospital or clinic, and once when you are not at a hospital or clinic. Record the average of the two measurements. To check your blood pressure when you are not at a hospital or clinic, you can use:  An automated blood pressure machine at a pharmacy.  A home blood pressure monitor.  Talk to your health care provider about your target blood pressure.  If you are between 58-57 years old, ask your health care provider if you should take aspirin to prevent heart disease.  Have regular diabetes screenings by checking your fasting blood sugar level.  If you are at a normal weight and have a low risk for diabetes, have this test once every three years after the age of 21.  If you are overweight and have a high risk for diabetes, consider being tested at a younger age or more often.  A one-time screening for abdominal aortic aneurysm (AAA) by ultrasound is recommended for men aged 60-75 years who are current or former smokers. What should I know about preventing infection? Hepatitis B  If you have a higher risk for hepatitis B, you should be screened for this virus. Talk with your health care provider to find out if you are at risk for hepatitis B infection. Hepatitis C  Blood testing is recommended for:  Everyone born from [birth year] through 1967.  Anyone with known risk factors for hepatitis C. Sexually Transmitted Diseases (STDs)  You should be screened each year for STDs including gonorrhea and chlamydia if:  You are sexually active and are younger than 73 years of age.  You are older than 73 years of age and your  health care provider tells you that you are at risk for this type of infection.  Your sexual activity has changed since you were last screened and you are at an increased risk for chlamydia or gonorrhea. Ask your health care provider if you are at risk.  Talk with your health care provider about whether you are at high risk of being infected with HIV. Your health care provider may recommend a prescription medicine to help prevent HIV infection. What else can I do?  Schedule regular health, dental, and eye exams.  Stay current with your vaccines (immunizations).  Do not use any tobacco products, such as cigarettes,  chewing tobacco, and e-cigarettes. If you need help quitting, ask your health care provider.  Limit alcohol intake to no more than 2 drinks per day. One drink equals 12 ounces of beer, 5 ounces of wine, or 1 ounces of hard liquor.  Do not use street drugs.  Do not share needles.  Ask your health care provider for help if you need support or information about quitting drugs.  Tell your health care provider if you often feel depressed.  Tell your health care provider if you have ever been abused or do not feel safe at home. This information is not intended to replace advice given to you by your health care provider. Make sure you discuss any questions you have with your health care provider. Document Released: 07/03/2007 Document Revised: 09/03/2015 Document Reviewed: 10/08/2014 Elsevier Interactive Patient Education  2017 [employer].

## 2016-06-01 ENCOUNTER — Other Ambulatory Visit: Payer: Self-pay | Admitting: Family Medicine

## 2016-06-07 LAB — HM DIABETES EYE EXAM

## 2016-06-30 ENCOUNTER — Telehealth: Payer: Self-pay | Admitting: Family Medicine

## 2016-06-30 LAB — BASIC METABOLIC PANEL
BUN: 31 — AB (ref 4–21)
Creatinine: 1.9 — AB (ref 0.6–1.3)
Glucose: 228
Potassium: 4.9 (ref 3.4–5.3)
SODIUM: 137 (ref 137–147)

## 2016-06-30 NOTE — Telephone Encounter (Signed)
Patient stopped in today to see Dr. Yong Channel or his nurse to see about refilling a particular Rx but wants to talk about it first  Before he request a refill.  Please Advise.

## 2016-07-01 NOTE — Telephone Encounter (Signed)
Called and left a voicemail message asking for a return phone call 

## 2016-07-02 ENCOUNTER — Encounter: Payer: Self-pay | Admitting: Family Medicine

## 2016-07-02 ENCOUNTER — Other Ambulatory Visit: Payer: Self-pay

## 2016-07-02 DIAGNOSIS — N5201 Erectile dysfunction due to arterial insufficiency: Secondary | ICD-10-CM

## 2016-07-02 MED ORDER — SILDENAFIL CITRATE 100 MG PO TABS: 100.0000 mg | ORAL_TABLET | Freq: Every day | ORAL | 5 refills | Status: DC | PRN

## 2016-07-02 NOTE — Telephone Encounter (Signed)
Patient came by office and was given a prescription

## 2016-07-02 NOTE — Telephone Encounter (Signed)
p 

## 2016-07-28 ENCOUNTER — Other Ambulatory Visit: Payer: Self-pay | Admitting: Family Medicine

## 2016-08-05 ENCOUNTER — Ambulatory Visit: Payer: Medicare Other | Admitting: Endocrinology

## 2016-08-05 ENCOUNTER — Encounter: Payer: Self-pay | Admitting: Endocrinology

## 2016-08-05 ENCOUNTER — Ambulatory Visit (INDEPENDENT_AMBULATORY_CARE_PROVIDER_SITE_OTHER): Payer: Medicare Other | Admitting: Endocrinology

## 2016-08-05 VITALS — BP 140/78 | HR 61 | Wt 231.6 lb

## 2016-08-05 DIAGNOSIS — E1122 Type 2 diabetes mellitus with diabetic chronic kidney disease: Secondary | ICD-10-CM | POA: Diagnosis not present

## 2016-08-05 DIAGNOSIS — Z794 Long term (current) use of insulin: Secondary | ICD-10-CM

## 2016-08-05 DIAGNOSIS — N183 Chronic kidney disease, stage 3 (moderate): Secondary | ICD-10-CM | POA: Diagnosis not present

## 2016-08-05 LAB — POCT GLYCOSYLATED HEMOGLOBIN (HGB A1C): Hemoglobin A1C: 7.9

## 2016-08-05 NOTE — Progress Notes (Signed)
Subjective:    Patient ID: Paul [name], male    DOB: [birth year], 71 y.o.   MRN: [id number]  HPI Pt returns for f/u of diabetes mellitus: DM type: Insulin-requiring type 2 Dx'ed: 9470 Complications: renal insufficiency and background retinopathy.   Therapy: insulin since 2009 DKA: never Severe hypoglycemia: never.   Pancreatitis: never.   Other: he takes qd insulin, after poor results with taking it more frequently; he declines weight loss surgery.   Interval history:  He does not check cbg's (he has a meter, but does not use it).  He says he never misses the insulin.   Past Medical History:  Diagnosis Date  . Colon polyps 12/09/2009   Multiple in descending colon  . Depression   . Diabetes mellitus    type 2  . Diverticulosis 12/09/2009   Moderate  . Hyperlipidemia   . Hypertension   . melanoma dx'd 12/2012   rt arm; surg   . Microalbuminuria     Past Surgical History:  Procedure Laterality Date  . MASS EXCISION  09/29/2011   [facility]-Excision sebaceous cyst on neck  . MELANOMA EXCISION  12/2012  . TONSILLECTOMY AND ADENOIDECTOMY      Social History   Social History  . Marital status: Married    Spouse name: N/A  . Number of children: 4  . Years of education: N/A   Occupational History  . 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 Smoking status: Former Smoker    Packs/day: 1.50    Years: 45.00    Types: Cigars, Cigarettes    Quit date: 09/13/1998  . Smokeless tobacco: Never Used  . Alcohol use 0.0 oz/week     Comment: very little/occ  . Drug use: No  . Sexual activity: No   Other Topics Concern  . Not on file   Social History Narrative   Married 4 kids. 6 grandkids.       Retired from [employer]: travel, reading    Current Outpatient Prescriptions on File Prior to Visit  Medication Sig Dispense Refill  . ACCU-CHEK FASTCLIX LANCETS MISC Use to check blood sugar 2 times per day 200 each 2  . bumetanide (BUMEX) 2 MG  tablet Take 1 tablet by mouth  daily (Patient taking differently: Take 2 times by mouth  daily) 90 tablet 3  . CARDIZEM CD 360 MG 24 hr capsule Take 1 capsule by mouth  daily with breakfast 90 capsule 3  . Cholecalciferol (VITAMIN D-3) 1000 UNITS CAPS Take 2,000 Units by mouth daily.    . cloNIDine (CATAPRES) 0.2 MG tablet Take 1 tablet by mouth 3 (three) times daily.    . hydrALAZINE (APRESOLINE) 100 MG tablet Take 100 mg by mouth 3 (three) times daily.     . Insulin Glargine (LANTUS SOLOSTAR) 100 UNIT/ML Solostar Pen Inject 120 Units into the skin every morning. And pen needles 2/day 60 pen 3  . Insulin Pen Needle 31G X 8 MM MISC Inject insulin two times a day. 200 each 2  . labetalol (NORMODYNE) 200 MG tablet TAKE 1 TABLET BY MOUTH TWO  TIMES DAILY 120 tablet 1  . Lancet Devices (AUTOLET IMPRESSION) MISC You to check blood sugar 2 times a day 2 each 1  . LIPITOR 20 MG tablet TAKE 1 TABLET BY MOUTH  DAILY AT 6 PM. 90 tablet 3  . Multiple Vitamins-Minerals (CENTURY SENIOR) TABS Take 1 tablet by mouth daily.    Marland Kitchen  Omega-3 Fatty Acids (FISH OIL) 1200 MG CAPS Take 1,200 mg by mouth daily.    Marland Kitchen PARoxetine (PAXIL) 20 MG tablet TAKE 1 TABLET BY MOUTH  DAILY 90 tablet 1  . sildenafil (VIAGRA) 100 MG tablet Take 1 tablet (100 mg total) by mouth daily as needed for erectile dysfunction (Single Pack requested. May only buy a few pills.). 10 tablet 5  . tadalafil (CIALIS) 20 MG tablet Take 0.5-1 tablets (10-20 mg total) by mouth every other day as needed for erectile dysfunction. 5 tablet 11  . valsartan (DIOVAN) 320 MG tablet Take 1 tablet by mouth  daily 90 tablet 3   No current facility-administered medications on file prior to visit.     Allergies  Allergen Reactions  . Amlodipine Swelling    Family History  Problem Relation Age of Onset  . Heart attack Mother        50 from chemo  . Breast cancer Mother        dx. <68; +chemo  . Hypertension Father   . Cerebral aneurysm Father   . BRCA 1/2  Maternal Aunt        never tested, but daughter has known mutation in BRCA2  . Kidney failure Maternal Aunt   . Stomach cancer Maternal Uncle        d. 12; smoker and issues with heartburn  . Heart Problems Paternal Grandfather   . BRCA 1/2 Cousin        BRCA2+; maternal 1st cousin; first person tested in family  . Breast cancer Cousin 59    BP 140/78   Pulse 61   Wt 231 lb 9.6 oz (105.1 kg)   SpO2 97%   BMI 38.54 kg/m    Review of Systems He denies hypoglycemia sxs.     Objective:   Physical Exam VITAL SIGNS:  See vs page GENERAL: no distress Pulses: foot pulses are intact bilaterally.   MSK: no deformity of the feet or ankles.  CV: 2+ bilat edema of the legs or ankles Skin:  no ulcer on the feet or ankles.  normal color and temp on the feet and ankles Neuro: sensation is intact to touch on the feet and ankles.    Lab Results  Component Value Date   CREATININE 1.9 (A) 06/30/2016   BUN 31 (A) 06/30/2016   NA 137 06/30/2016   K 4.9 06/30/2016   CL 103 05/11/2016   CO2 31 05/11/2016    Lab Results  Component Value Date   HGBA1C 7.9 08/05/2016      Assessment & Plan:  Insulin-requiring type 2 DM, with DR: this is the best control this pt should aim for, given this regimen, which does match insulin to his changing needs throughout the day.  Noncompliance with cbg recording: we discussed risks.   Renal insuff: in this setting, he is at risk for hypoglycemia.  Patient Instructions  check your blood sugar twice a day.  vary the time of day when you check, between before the 3 meals, and at bedtime.  also check if you have symptoms of your blood sugar being too high or too low.  please keep a record of the readings and bring it to your next appointment here.  please call us sooner if your blood sugar goes below 70, or if you have a lot of readings over 200.   On this type of insulin schedule, you should eat meals on a regular schedule.  If a meal is missed or  significantly delayed, your blood sugar could go low.    Please continue the same insulin.   Please come back for a follow-up appointment in 3 months.

## 2016-08-05 NOTE — Patient Instructions (Addendum)
check your blood sugar twice a day.  vary the time of day when you check, between before the 3 meals, and at bedtime.  also check if you have symptoms of your blood sugar being too high or too low.  please keep a record of the readings and bring it to your next appointment here.  please call us sooner if your blood sugar goes below 70, or if you have a lot of readings over 200.   On this type of insulin schedule, you should eat meals on a regular schedule.  If a meal is missed or significantly delayed, your blood sugar could go low.    Please continue the same insulin.   Please come back for a follow-up appointment in 3 months.

## 2016-08-09 ENCOUNTER — Other Ambulatory Visit: Payer: Self-pay | Admitting: Endocrinology

## 2016-08-23 ENCOUNTER — Telehealth: Payer: Self-pay | Admitting: Family Medicine

## 2016-08-23 MED ORDER — IRBESARTAN 300 MG PO TABS: 300.0000 mg | ORAL_TABLET | Freq: Every day | ORAL | 3 refills | Status: DC

## 2016-08-23 NOTE — Telephone Encounter (Signed)
Meds ordered this encounter  Medications  . irbesartan (AVAPRO) 300 MG tablet    Sig: Take 1 tablet (300 mg total) by mouth daily.    Dispense:  90 tablet    Refill:  3   Stop valsartan. Please inform patient.

## 2016-08-24 NOTE — Telephone Encounter (Signed)
Called and left a voicemail message asking for a return phone call 

## 2016-08-25 NOTE — Telephone Encounter (Signed)
Spoke with patient who verbalized understanding.

## 2016-09-03 ENCOUNTER — Encounter: Payer: Self-pay | Admitting: Family Medicine

## 2016-09-03 ENCOUNTER — Ambulatory Visit (INDEPENDENT_AMBULATORY_CARE_PROVIDER_SITE_OTHER): Payer: Medicare Other | Admitting: Family Medicine

## 2016-09-03 VITALS — BP 122/68 | HR 60 | Temp 98.3°F | Ht 65.0 in | Wt 231.4 lb

## 2016-09-03 DIAGNOSIS — E1122 Type 2 diabetes mellitus with diabetic chronic kidney disease: Secondary | ICD-10-CM

## 2016-09-03 DIAGNOSIS — Z0000 Encounter for general adult medical examination without abnormal findings: Secondary | ICD-10-CM | POA: Diagnosis not present

## 2016-09-03 DIAGNOSIS — N183 Chronic kidney disease, stage 3 unspecified: Secondary | ICD-10-CM

## 2016-09-03 DIAGNOSIS — R351 Nocturia: Secondary | ICD-10-CM | POA: Diagnosis not present

## 2016-09-03 DIAGNOSIS — Z794 Long term (current) use of insulin: Secondary | ICD-10-CM

## 2016-09-03 DIAGNOSIS — I350 Nonrheumatic aortic (valve) stenosis: Secondary | ICD-10-CM | POA: Diagnosis not present

## 2016-09-03 DIAGNOSIS — R809 Proteinuria, unspecified: Secondary | ICD-10-CM

## 2016-09-03 DIAGNOSIS — F325 Major depressive disorder, single episode, in full remission: Secondary | ICD-10-CM

## 2016-09-03 DIAGNOSIS — E785 Hyperlipidemia, unspecified: Secondary | ICD-10-CM

## 2016-09-03 DIAGNOSIS — I10 Essential (primary) hypertension: Secondary | ICD-10-CM | POA: Diagnosis not present

## 2016-09-03 DIAGNOSIS — I5032 Chronic diastolic (congestive) heart failure: Secondary | ICD-10-CM | POA: Diagnosis not present

## 2016-09-03 DIAGNOSIS — N401 Enlarged prostate with lower urinary tract symptoms: Secondary | ICD-10-CM

## 2016-09-03 LAB — COMPREHENSIVE METABOLIC PANEL
ALK PHOS: 86 U/L (ref 39–117)
ALT: 65 U/L — AB (ref 0–53)
AST: 39 U/L — ABNORMAL HIGH (ref 0–37)
Albumin: 3.7 g/dL (ref 3.5–5.2)
BILIRUBIN TOTAL: 0.6 mg/dL (ref 0.2–1.2)
BUN: 24 mg/dL — ABNORMAL HIGH (ref 6–23)
CALCIUM: 9.8 mg/dL (ref 8.4–10.5)
CO2: 32 mEq/L (ref 19–32)
Chloride: 104 mEq/L (ref 96–112)
Creatinine, Ser: 1.57 mg/dL — ABNORMAL HIGH (ref 0.40–1.50)
GFR: 46.25 mL/min — AB (ref >60.00)
Glucose, Bld: 180 mg/dL — ABNORMAL HIGH (ref 70–99)
Potassium: 4.1 mEq/L (ref 3.5–5.1)
Sodium: 142 mEq/L (ref 135–145)
TOTAL PROTEIN: 6.1 g/dL (ref 6.0–8.3)

## 2016-09-03 LAB — CBC
HCT: 40.3 % (ref 39.0–52.0)
HEMOGLOBIN: 13.4 g/dL (ref 13.0–17.0)
MCHC: 33.3 g/dL (ref 30.0–36.0)
MCV: 93.6 fl (ref 78.0–100.0)
PLATELETS: 193 10*3/uL (ref 150.0–400.0)
RBC: 4.31 Mil/uL (ref 4.22–5.81)
RDW: 14.2 % (ref 11.5–15.5)
WBC: 8.8 10*3/uL (ref 4.0–10.5)

## 2016-09-03 LAB — LIPID PANEL
Cholesterol: 133 mg/dL (ref 0–200)
HDL: 38.2 mg/dL — AB (ref >39.00)
LDL Cholesterol: 68 mg/dL (ref 0–99)
NONHDL: 94.61
TRIGLYCERIDES: 131 mg/dL (ref 0.0–149.0)
Total CHOL/HDL Ratio: 3
VLDL: 26.2 mg/dL (ref 0.0–40.0)

## 2016-09-03 LAB — PSA: PSA: 1.43 ng/mL (ref 0.10–4.00)

## 2016-09-03 NOTE — Assessment & Plan Note (Signed)
Mild aortic stenosis 11/2013- discussed repeat today

## 2016-09-03 NOTE — Patient Instructions (Addendum)
We will call you within a week or two about your referral to echocardiogram. If you do not hear within 3 weeks, give [place] a call.   Please stop by lab before you go  Or could do 6 month follow up  I would love to see you down at least 5-10 lbs by follow up. Regular exercise and healthy eating advised

## 2016-09-03 NOTE — Assessment & Plan Note (Signed)
CKD III- update BMET

## 2016-09-03 NOTE — Assessment & Plan Note (Signed)
Depression- no Si. PHQ2 of 0. Continue paxil 20mg 

## 2016-09-03 NOTE — Progress Notes (Signed)
Phone: [phone]  Subjective:  Patient presents today for their annual physical. Chief complaint-noted.   See problem oriented charting- ROS- full  review of systems was completed and negative including No chest pain or shortness of breath. No headache or blurry vision. Stable edema.   The following were reviewed and entered/updated in epic: Past Medical History:  Diagnosis Date  . Colon polyps 12/09/2009   Multiple in descending colon  . Depression   . Diabetes mellitus    type 2  . Diverticulosis 12/09/2009   Moderate  . Hyperlipidemia   . Hypertension   . melanoma dx'd 12/2012   rt arm; surg   . Microalbuminuria    Patient Active Problem List   Diagnosis Date Noted  . 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 Chronic diastolic heart failure ([place]) 11/26/2013    Priority: High  . History of melanoma 12/18/2012    Priority: High  . Type 2 diabetes, controlled, with renal manifestation ([place]) 07/28/2006    Priority: High  . Essential hypertension 07/28/2006    Priority: High  . CKD (chronic kidney disease), stage III 09/19/2014    Priority: Medium  . Obstructive sleep apnea 03/27/2014    Priority: Medium  . Former smoker 01/02/2014    Priority: Medium  . Mild aortic stenosis 11/26/2013    Priority: Medium  . Hyperlipidemia 07/28/2006    Priority: Medium  . Depression 07/28/2006    Priority: Medium  . BRCA2 positive 02/06/2015    Priority: Low  . Genetic testing 02/06/2015    Priority: Low  . Diverticulosis of colon with hemorrhage 08/10/2013    Priority: Low  . Hx of adenomatous colonic polyps 08/10/2013    Priority: Low  . Erectile dysfunction 09/04/2009    Priority: Low  . Obesity 12/28/2007    Priority: Low  . Gout 06/28/2007    Priority: Low  . Anemia 12/27/2006    Priority: Low  . MICROALBUMINURIA 07/28/2006    Priority: Low   Past Surgical History:  Procedure Laterality Date  . MASS EXCISION  09/29/2011   [facility]-Excision  sebaceous cyst on neck  . MELANOMA EXCISION  12/2012  . TONSILLECTOMY AND ADENOIDECTOMY      Family History  Problem Relation Age of Onset  . Heart attack Mother        58 from chemo  . Breast cancer Mother        dx. <68; +chemo  . Hypertension Father   . Cerebral aneurysm Father   . BRCA 1/2 Maternal Aunt        never tested, but daughter has known mutation in BRCA2  . Kidney failure Maternal Aunt   . Stomach cancer Maternal Uncle        d. 49; smoker and issues with heartburn  . Heart Problems Paternal Grandfather   . BRCA 1/2 Cousin        BRCA2+; maternal 1st cousin; first person tested in family  . Breast cancer Cousin 55    Medications- reviewed and updated Current Outpatient Prescriptions  Medication Sig Dispense Refill  . ACCU-CHEK FASTCLIX LANCETS MISC Use to check blood sugar 2 times per day 200 each 2  . bumetanide (BUMEX) 2 MG tablet Take 1 tablet by mouth  daily (Patient taking differently: Take 2 times by mouth  daily) 90 tablet 3  . CARDIZEM CD 360 MG 24 hr capsule Take 1 capsule by mouth  daily with breakfast 90 capsule 3  . Cholecalciferol (VITAMIN D-3)  1000 UNITS CAPS Take 2,000 Units by mouth daily.    . cloNIDine (CATAPRES) 0.2 MG tablet Take 1 tablet by mouth 3 (three) times daily.    . hydrALAZINE (APRESOLINE) 100 MG tablet Take 100 mg by mouth 3 (three) times daily.     . Insulin Glargine (LANTUS SOLOSTAR) 100 UNIT/ML Solostar Pen Inject 120 Units into the skin every morning. And pen needles 2/day 60 pen 3  . Insulin Pen Needle 31G X 8 MM MISC Inject insulin two times a day. 200 each 2  . irbesartan (AVAPRO) 300 MG tablet Take 1 tablet (300 mg total) by mouth daily. 90 tablet 3  . labetalol (NORMODYNE) 200 MG tablet TAKE 1 TABLET BY MOUTH TWO  TIMES DAILY 120 tablet 1  . Lancet Devices (AUTOLET IMPRESSION) MISC You to check blood sugar 2 times a day 2 each 1  . LANTUS SOLOSTAR 100 UNIT/ML Solostar Pen INJECT SUBCUTANEOUSLY 120  UNITS IN THE MORNING 60 mL  2  . LIPITOR 20 MG tablet TAKE 1 TABLET BY MOUTH  DAILY AT 6 PM. 90 tablet 3  . Multiple Vitamins-Minerals (CENTURY SENIOR) TABS Take 1 tablet by mouth daily.    . Omega-3 Fatty Acids (FISH OIL) 1200 MG CAPS Take 1,200 mg by mouth daily.    Marland Kitchen PARoxetine (PAXIL) 20 MG tablet TAKE 1 TABLET BY MOUTH  DAILY 90 tablet 1  . sildenafil (VIAGRA) 100 MG tablet Take 1 tablet (100 mg total) by mouth daily as needed for erectile dysfunction (Single Pack requested. May only buy a few pills.). 10 tablet 5  . tadalafil (CIALIS) 20 MG tablet Take 0.5-1 tablets (10-20 mg total) by mouth every other day as needed for erectile dysfunction. 5 tablet 11   No current facility-administered medications for this visit.     Allergies-reviewed and updated Allergies  Allergen Reactions  . Amlodipine Swelling    Social History   Social History  . Marital status: Married    Spouse name: N/A  . Number of children: 4  . Years of education: N/A   Occupational History  . 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 Smoking status: Former Smoker    Packs/day: 1.50    Years: 45.00    Types: Cigars, Cigarettes    Quit date: 09/13/1998  . Smokeless tobacco: Never Used  . Alcohol use 0.0 oz/week     Comment: very little/occ  . Drug use: No  . Sexual activity: No   Other Topics Concern  . None   Social History Narrative   Married 4 kids. 6 grandkids.       Retired from [employer]: travel, reading    Objective: BP 122/68 (BP Location: Left Arm, Patient Position: Sitting, Cuff Size: Large)   Pulse 60   Temp 98.3 F (36.8 C) (Oral)   Ht _0  (1.651 m)   Wt 231 lb 6.4 oz (105 kg)   SpO2 95%   BMI 38.51 kg/m  Gen: NAD, resting comfortably HEENT: Mucous membranes are moist. Oropharynx normal Neck: no thyromegaly CV: RRR no murmurs rubs or gallops Lungs: CTAB no crackles, wheeze, rhonchi Abdomen: soft/nontender/nondistended/normal bowel sounds. No rebound or guarding.morbid  obesity  Ext: 1+ edema Skin: warm, dry Neuro: grossly normal, moves all extremities, PERRLA Rectal: normal tone, diffusely enlarged prostate, no masses or tenderness. Difficult to get full depth exam due to my finger length and his obesity  Assessment/Plan:  71 y.o. male presenting for annual  physical.  Health Maintenance counseling: 1. Anticipatory guidance: Patient counseled regarding regular dental exams q 6 months, eye exams yearly, wearing seatbelts.  2. Risk factor reduction:  Advised patient of need for regular exercise and diet rich and fruits and vegetables to reduce risk of heart attack and stroke. Exercise- strongly encouraged exercise- 150 minutes a week. Diet-improved carb intake.  Wt Readings from Last 3 Encounters:  09/03/16 231 lb 6.4 oz (105 kg)  08/05/16 231 lb 9.6 oz (105.1 kg)  05/25/16 228 lb 3.2 oz (103.5 kg)  3. Immunizations/screenings/ancillary studies- advised fall flu shot and shingrix at pharmacy Immunization History  Administered Date(s) Administered  . Influenza Split 10/18/2011  . Influenza Whole 10/19/2006, 10/18/2009  . Influenza, High Dose Seasonal PF 10/31/2012  . Influenza,inj,Quad PF,36+ Mos 10/22/2013, 09/19/2014  . Pneumococcal Conjugate-13 10/22/2013  . Pneumococcal Polysaccharide-23 09/11/2008  . Tdap 10/07/2010  . Zoster 10/15/2010  4. Prostate cancer screening-   Discussed guidelines 55-70 per usptf. Urinates up to 4x a night for several years and has not had PSA in years- agreed to screen through 73. Discussed benefits and risks 5. Colon cancer screening - 09/26/13 with 5 year repeat 6. Skin cancer screening-  sees dermatology regularly- history of melanoma. Recent resection with skin surgery center  Status of chronic or acute concerns   DM- following up with Dr. Loanne Drilling- working on improved control. States watching carbs and believes #s improving- fits clothes better Lab Results  Component Value Date   HGBA1C 7.9 08/05/2016   Chronic  diastolic heart failure CHF- compliant with bumex 59m BID . Stable edema. Weight largely stable- down 1 lb from last visit here.   Essential hypertension HTN- at goal on bumex 2 mg BID, diltiazem 3685mXR, clonidine 0.28m58mID, hydralazine TID, labetalol 200m59mD, irbesartan (changed from valsartan due to recall- but actually hasnt switched yet- encouraged him to do so). Spironolactone stopped this year due to gynecomastia  Mild aortic stenosis Mild aortic stenosis 11/2013- discussed repeat today  Depression Depression- no Si. PHQ2 of 0. Continue paxil 20mg86mD (chronic kidney disease), stage III CKD III- update BMET   Return in about 4 months (around 01/03/2017) for follow up- or sooner if needed.  Orders Placed This Encounter  Procedures  . CBC    [place]  . Comprehensive metabolic panel    [place]    Order Specific Question:   Has the patient fasted?    Answer:   No  . Lipid panel    [place]    Order Specific Question:   Has the patient fasted?    Answer:   No  . PSA  . ECHOCARDIOGRAM COMPLETE    Standing Status:   Future    Standing Expiration Date:   12/04/2017    Order Specific Question:   Where should this test be performed    Answer[facility] [place] [facility]:   Does the patient weigh less than or greater than 250 lbs?    Answer:   Patient weighs less than 250 lbs    Order Specific Question:   Complete or Limited study?    Answer:   Complete    Order Specific Question:   With Image Enhancing Agent or without Image Enhancing Agent?    Answer:   With Image Enhancing Agent    Order Specific Question:   Reason for exam-Echo    Answer:   Aortic Stenosis 424.1 / 135.0   Return  precautions advised.  Garret Reddish, MD

## 2016-09-03 NOTE — Assessment & Plan Note (Addendum)
CHF- compliant with bumex 2mg  BID . Stable edema. Weight largely stable- down 1 lb from last visit here.

## 2016-09-03 NOTE — Assessment & Plan Note (Signed)
HTN, HLD, DM and BMI >35. Encouraged need for healthy eating, regular exercise, weight loss.

## 2016-09-03 NOTE — Assessment & Plan Note (Signed)
HTN- at goal on bumex 2 mg BID, diltiazem 360mg  XR, clonidine 0.2mg  TID, hydralazine TID, labetalol 200mg  BID, irbesartan (changed from valsartan due to recall- but actually hasnt switched yet- encouraged him to do so). Spironolactone stopped this year due to gynecomastia

## 2016-09-13 ENCOUNTER — Ambulatory Visit (HOSPITAL_COMMUNITY): Payer: Medicare Other | Attending: Cardiology

## 2016-09-13 ENCOUNTER — Other Ambulatory Visit: Payer: Self-pay

## 2016-09-13 DIAGNOSIS — I350 Nonrheumatic aortic (valve) stenosis: Secondary | ICD-10-CM | POA: Insufficient documentation

## 2016-09-13 DIAGNOSIS — E119 Type 2 diabetes mellitus without complications: Secondary | ICD-10-CM | POA: Diagnosis not present

## 2016-09-13 DIAGNOSIS — E785 Hyperlipidemia, unspecified: Secondary | ICD-10-CM | POA: Insufficient documentation

## 2016-09-13 DIAGNOSIS — Z87891 Personal history of nicotine dependence: Secondary | ICD-10-CM | POA: Diagnosis not present

## 2016-09-13 DIAGNOSIS — I10 Essential (primary) hypertension: Secondary | ICD-10-CM | POA: Insufficient documentation

## 2016-09-15 ENCOUNTER — Other Ambulatory Visit: Payer: Self-pay | Admitting: Family Medicine

## 2016-09-21 ENCOUNTER — Telehealth: Payer: Self-pay | Admitting: Family Medicine

## 2016-09-21 NOTE — Telephone Encounter (Signed)
Jamie pt returned your call. °

## 2016-09-21 NOTE — Telephone Encounter (Signed)
Spoke with patient who verbalized understanding of lab results 

## 2016-09-23 ENCOUNTER — Ambulatory Visit (INDEPENDENT_AMBULATORY_CARE_PROVIDER_SITE_OTHER): Payer: Medicare Other | Admitting: Adult Health

## 2016-09-23 ENCOUNTER — Encounter: Payer: Self-pay | Admitting: Adult Health

## 2016-09-23 VITALS — BP 148/64 | Temp 98.3°F | Ht 65.0 in | Wt 231.0 lb

## 2016-09-23 DIAGNOSIS — M10471 Other secondary gout, right ankle and foot: Secondary | ICD-10-CM

## 2016-09-23 DIAGNOSIS — J209 Acute bronchitis, unspecified: Secondary | ICD-10-CM

## 2016-09-23 MED ORDER — PREDNISONE 10 MG PO TABS: ORAL_TABLET | ORAL | 0 refills | Status: DC

## 2016-09-23 NOTE — Progress Notes (Signed)
Subjective:    Patient ID: Paul [name], male    DOB: June 24, 1945, 71 y.o.   MRN: [id number]  HPI  71 year old male who  has a past medical history of Colon polyps (12/09/2009); Depression; Diabetes mellitus; Diverticulosis (12/09/2009); Hyperlipidemia; Hypertension; melanoma (dx'd 12/2012); and Microalbuminuria. He is a patient of Dr. Yong Channel who I am seeing today for an acute issue of gout flare in right great toe. His symptoms started 3 days ago. His last flare was in June 2017 . He has not been taking any pain medication at home. He believes that his symptoms are a result of " over indulging in [facility]"    Review of Systems See HPI  Past Medical History:  Diagnosis Date  . Colon polyps 12/09/2009   Multiple in descending colon  . Depression   . Diabetes mellitus    type 2  . Diverticulosis 12/09/2009   Moderate  . Hyperlipidemia   . Hypertension   . melanoma dx'd 12/2012   rt arm; surg   . Microalbuminuria     Social History   Social History  . Marital status: Married    Spouse name: N/A  . Number of children: 4  . Years of education: N/A   Occupational History  . 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 Smoking status: Former Smoker    Packs/day: 1.50    Years: 45.00    Types: Cigars, Cigarettes    Quit date: 09/13/1998  . Smokeless tobacco: Never Used  . Alcohol use 0.0 oz/week     Comment: very little/occ  . Drug use: No  . Sexual activity: No   Other Topics Concern  . Not on file   Social History Narrative   Married 4 kids. 6 grandkids.       Retired from [employer]: travel, reading    Past Surgical History:  Procedure Laterality Date  . MASS EXCISION  09/29/2011   [facility]-Excision sebaceous cyst on neck  . MELANOMA EXCISION  12/2012  . TONSILLECTOMY AND ADENOIDECTOMY      Family History  Problem Relation Age of Onset  . Heart attack Mother        52 from chemo  . Breast cancer Mother        dx. <68; +chemo   . Hypertension Father   . Cerebral aneurysm Father   . BRCA 1/2 Maternal Aunt        never tested, but daughter has known mutation in BRCA2  . Kidney failure Maternal Aunt   . Stomach cancer Maternal Uncle        d. 18; smoker and issues with heartburn  . Heart Problems Paternal Grandfather   . BRCA 1/2 Cousin        BRCA2+; maternal 1st cousin; first person tested in family  . Breast cancer Cousin 64    Allergies  Allergen Reactions  . Amlodipine Swelling    Current Outpatient Prescriptions on File Prior to Visit  Medication Sig Dispense Refill  . ACCU-CHEK FASTCLIX LANCETS MISC Use to check blood sugar 2 times per day 200 each 2  . atorvastatin (LIPITOR) 20 MG tablet TAKE 1 TABLET BY MOUTH  DAILY AT 6 PM. 90 tablet 1  . bumetanide (BUMEX) 2 MG tablet Take 1 tablet by mouth  daily (Patient taking differently: Take 2 tablets in the morning and 1 in the afternoon) 90 tablet 3  . Cholecalciferol (VITAMIN D-3) 1000  UNITS CAPS Take 2,000 Units by mouth daily.    . cloNIDine (CATAPRES) 0.2 MG tablet Take 1 tablet by mouth 3 (three) times daily.    Marland Kitchen diltiazem (CARDIZEM CD) 360 MG 24 hr capsule TAKE 1 CAPSULE BY MOUTH  DAILY WITH BREAKFAST 90 capsule 1  . hydrALAZINE (APRESOLINE) 100 MG tablet Take 100 mg by mouth 3 (three) times daily.     . Insulin Glargine (LANTUS SOLOSTAR) 100 UNIT/ML Solostar Pen Inject 120 Units into the skin every morning. And pen needles 2/day 60 pen 3  . Insulin Pen Needle 31G X 8 MM MISC Inject insulin two times a day. 200 each 2  . irbesartan (AVAPRO) 300 MG tablet Take 1 tablet (300 mg total) by mouth daily. 90 tablet 3  . labetalol (NORMODYNE) 200 MG tablet TAKE 1 TABLET BY MOUTH TWO  TIMES DAILY 180 tablet 1  . Lancet Devices (AUTOLET IMPRESSION) MISC You to check blood sugar 2 times a day 2 each 1  . LANTUS SOLOSTAR 100 UNIT/ML Solostar Pen INJECT SUBCUTANEOUSLY 120  UNITS IN THE MORNING 60 mL 2  . Multiple Vitamins-Minerals (CENTURY SENIOR) TABS Take 1  tablet by mouth daily.    . Omega-3 Fatty Acids (FISH OIL) 1200 MG CAPS Take 1,200 mg by mouth daily.    Marland Kitchen PARoxetine (PAXIL) 20 MG tablet TAKE 1 TABLET BY MOUTH  DAILY 90 tablet 1  . sildenafil (VIAGRA) 100 MG tablet Take 1 tablet (100 mg total) by mouth daily as needed for erectile dysfunction (Single Pack requested. May only buy a few pills.). (Patient not taking: Reported on 09/23/2016) 10 tablet 5  . tadalafil (CIALIS) 20 MG tablet Take 0.5-1 tablets (10-20 mg total) by mouth every other day as needed for erectile dysfunction. (Patient not taking: Reported on 09/23/2016) 5 tablet 11   No current facility-administered medications on file prior to visit.     BP (!) 148/64 (BP Location: Left Arm)   Temp 98.3 F (36.8 C) (Oral)   Ht '5\' 5"'$  (1.651 m)   Wt 231 lb (104.8 kg)   BMI 38.44 kg/m       Objective:   Physical Exam  Constitutional: He is oriented to person, place, and time. He appears well-developed and well-nourished. No distress.  Cardiovascular: Normal rate, regular rhythm, normal heart sounds and intact distal pulses.  Exam reveals no gallop and no friction rub.   No murmur heard. Pulmonary/Chest: Effort normal and breath sounds normal. No respiratory distress. He has no wheezes. He has no rales. He exhibits no tenderness.  Musculoskeletal: He exhibits edema and tenderness.  Joint swelling of MTP joint of right great toe. Swelling, redness and warmth noted   Neurological: He is alert and oriented to person, place, and time.  Skin: Skin is warm and dry. He is not diaphoretic. There is erythema.  Nursing note and vitals reviewed.     Assessment & Plan:  1. Acute gout due to other secondary cause involving toe of right foot - He has responded well in the past with prednisone. Advised to drink with plenty of water to keep blood sugars from elevating  - predniSONE (DELTASONE) 10 MG tablet; 40 mg x 3 days, 20 mg x 3 days, 10 mg x 3 days  Dispense: 21 tablet; Refill: 0 - Follow up  with PCP if no improvement   Dorothyann Peng, NP

## 2016-10-11 ENCOUNTER — Other Ambulatory Visit: Payer: Self-pay | Admitting: Endocrinology

## 2016-11-05 ENCOUNTER — Encounter: Payer: Self-pay | Admitting: Endocrinology

## 2016-11-05 ENCOUNTER — Ambulatory Visit (INDEPENDENT_AMBULATORY_CARE_PROVIDER_SITE_OTHER): Payer: Medicare Other | Admitting: Endocrinology

## 2016-11-05 VITALS — BP 134/72 | HR 58 | Wt 233.4 lb

## 2016-11-05 DIAGNOSIS — Z23 Encounter for immunization: Secondary | ICD-10-CM | POA: Diagnosis not present

## 2016-11-05 DIAGNOSIS — N183 Chronic kidney disease, stage 3 (moderate): Secondary | ICD-10-CM

## 2016-11-05 DIAGNOSIS — E1122 Type 2 diabetes mellitus with diabetic chronic kidney disease: Secondary | ICD-10-CM | POA: Diagnosis not present

## 2016-11-05 DIAGNOSIS — Z794 Long term (current) use of insulin: Secondary | ICD-10-CM | POA: Diagnosis not present

## 2016-11-05 LAB — POCT GLYCOSYLATED HEMOGLOBIN (HGB A1C): HEMOGLOBIN A1C: 8.7

## 2016-11-05 MED ORDER — INSULIN GLARGINE 100 UNIT/ML SOLOSTAR PEN: 130.0000 [IU] | PEN_INJECTOR | SUBCUTANEOUS | 3 refills | Status: DC

## 2016-11-05 NOTE — Patient Instructions (Addendum)
check your blood sugar twice a day.  vary the time of day when you check, between before the 3 meals, and at bedtime.  also check if you have symptoms of your blood sugar being too high or too low.  please keep a record of the readings and bring it to your next appointment here.  please call us sooner if your blood sugar goes below 70, or if you have a lot of readings over 200.   On this type of insulin schedule, you should eat meals on a regular schedule.  If a meal is missed or significantly delayed, your blood sugar could go low.    Please continue the same insulin to 130 units each morning.  However, if you are going to be active, take just 80 units that morning.  Please come back for a follow-up appointment in 4 months.

## 2016-11-05 NOTE — Progress Notes (Signed)
Subjective:    Patient ID: Paul [name], male    DOB: 08-26-1945, 71 y.o.   MRN: [id number]  HPI Pt returns for f/u of diabetes mellitus: DM type: Insulin-requiring type 2 Dx'ed: 0321 Complications: renal insufficiency and background retinopathy.   Therapy: insulin since 2009 DKA: never.  Severe hypoglycemia: never.   Pancreatitis: never.   Other: he takes qd insulin, after poor results with taking it more frequently; he declines weight loss surgery.   Interval history:  He does not check cbg's.  He says he never misses the insulin.  Pt feels as though he has mild hypoglycemia, with activity Past Medical History:  Diagnosis Date  . Colon polyps 12/09/2009   Multiple in descending colon  . Depression   . Diabetes mellitus    type 2  . Diverticulosis 12/09/2009   Moderate  . Hyperlipidemia   . Hypertension   . melanoma dx'd 12/2012   rt arm; surg   . Microalbuminuria     Past Surgical History:  Procedure Laterality Date  . MASS EXCISION  09/29/2011   [facility]-Excision sebaceous cyst on neck  . MELANOMA EXCISION  12/2012  . TONSILLECTOMY AND ADENOIDECTOMY      Social History   Social History  . Marital status: Married    Spouse name: N/A  . Number of children: 4  . Years of education: N/A   Occupational History  . 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 Smoking status: Former Smoker    Packs/day: 1.50    Years: 45.00    Types: Cigars, Cigarettes    Quit date: 09/13/1998  . Smokeless tobacco: Never Used  . Alcohol use 0.0 oz/week     Comment: very little/occ  . Drug use: No  . Sexual activity: No   Other Topics Concern  . Not on file   Social History Narrative   Married 4 kids. 6 grandkids.       Retired from [employer]: travel, reading    Current Outpatient Prescriptions on File Prior to Visit  Medication Sig Dispense Refill  . ACCU-CHEK FASTCLIX LANCETS MISC Use to check blood sugar 2 times per day 200 each 2  .  atorvastatin (LIPITOR) 20 MG tablet TAKE 1 TABLET BY MOUTH  DAILY AT 6 PM. 90 tablet 1  . bumetanide (BUMEX) 2 MG tablet Take 1 tablet by mouth  daily (Patient taking differently: Take 2 tablets in the morning and 1 in the afternoon) 90 tablet 3  . Cholecalciferol (VITAMIN D-3) 1000 UNITS CAPS Take 2,000 Units by mouth daily.    . cloNIDine (CATAPRES) 0.2 MG tablet Take 1 tablet by mouth 3 (three) times daily.    Marland Kitchen diltiazem (CARDIZEM CD) 360 MG 24 hr capsule TAKE 1 CAPSULE BY MOUTH  DAILY WITH BREAKFAST 90 capsule 1  . hydrALAZINE (APRESOLINE) 100 MG tablet Take 100 mg by mouth 3 (three) times daily.     . Insulin Pen Needle 31G X 8 MM MISC Inject insulin two times a day. 200 each 2  . irbesartan (AVAPRO) 300 MG tablet Take 1 tablet (300 mg total) by mouth daily. 90 tablet 3  . labetalol (NORMODYNE) 200 MG tablet TAKE 1 TABLET BY MOUTH TWO  TIMES DAILY 180 tablet 1  . Lancet Devices (AUTOLET IMPRESSION) MISC You to check blood sugar 2 times a day 2 each 1  . Multiple Vitamins-Minerals (CENTURY SENIOR) TABS Take 1 tablet by mouth daily.    Marland Kitchen  Omega-3 Fatty Acids (FISH OIL) 1200 MG CAPS Take 1,200 mg by mouth daily.    Marland Kitchen PARoxetine (PAXIL) 20 MG tablet TAKE 1 TABLET BY MOUTH  DAILY 90 tablet 1  . sildenafil (VIAGRA) 100 MG tablet Take 1 tablet (100 mg total) by mouth daily as needed for erectile dysfunction (Single Pack requested. May only buy a few pills.). 10 tablet 5  . tadalafil (CIALIS) 20 MG tablet Take 0.5-1 tablets (10-20 mg total) by mouth every other day as needed for erectile dysfunction. 5 tablet 11   No current facility-administered medications on file prior to visit.     Allergies  Allergen Reactions  . Amlodipine Swelling    Family History  Problem Relation Age of Onset  . Heart attack Mother        52 from chemo  . Breast cancer Mother        dx. <68; +chemo  . Hypertension Father   . Cerebral aneurysm Father   . BRCA 1/2 Maternal Aunt        never tested, but daughter  has known mutation in BRCA2  . Kidney failure Maternal Aunt   . Stomach cancer Maternal Uncle        d. 30; smoker and issues with heartburn  . Heart Problems Paternal Grandfather   . BRCA 1/2 Cousin        BRCA2+; maternal 1st cousin; first person tested in family  . Breast cancer Cousin 59    BP 134/72   Pulse (!) 58   Wt 233 lb 6.4 oz (105.9 kg)   SpO2 94%   BMI 38.84 kg/m    Review of Systems Denies LOC    Objective:   Physical Exam VITAL SIGNS:  See vs page GENERAL: no distress Pulses: foot pulses are intact bilaterally.   MSK: no deformity of the feet or ankles.  CV: 2+ bilat edema of the legs or ankles Skin:  no ulcer on the feet or ankles.  normal color and temp on the feet and ankles Neuro: sensation is intact to touch on the feet and ankles.    Lab Results  Component Value Date   HGBA1C 8.7 11/05/2016       Assessment & Plan:  Insulin-requiring type 2 DM: worse. Noncompliance with cbg recording: he needs a simple schedule.  Hypoglycemia: he should adjust insulin for activity.  Patient Instructions  check your blood sugar twice a day.  vary the time of day when you check, between before the 3 meals, and at bedtime.  also check if you have symptoms of your blood sugar being too high or too low.  please keep a record of the readings and bring it to your next appointment here.  please call us sooner if your blood sugar goes below 70, or if you have a lot of readings over 200.   On this type of insulin schedule, you should eat meals on a regular schedule.  If a meal is missed or significantly delayed, your blood sugar could go low.    Please continue the same insulin to 130 units each morning.  However, if you are going to be active, take just 80 units that morning.  Please come back for a follow-up appointment in 4 months.

## 2016-11-09 ENCOUNTER — Encounter: Payer: Self-pay | Admitting: Endocrinology

## 2016-11-09 ENCOUNTER — Other Ambulatory Visit: Payer: Self-pay

## 2016-11-09 MED ORDER — INSULIN GLARGINE 100 UNIT/ML SOLOSTAR PEN: 130.0000 [IU] | PEN_INJECTOR | SUBCUTANEOUS | 3 refills | Status: DC

## 2016-11-10 ENCOUNTER — Other Ambulatory Visit: Payer: Self-pay

## 2016-11-10 MED ORDER — INSULIN GLARGINE 100 UNIT/ML SOLOSTAR PEN: 130.0000 [IU] | PEN_INJECTOR | SUBCUTANEOUS | 3 refills | Status: DC

## 2016-11-12 ENCOUNTER — Telehealth: Payer: Self-pay

## 2016-11-12 ENCOUNTER — Other Ambulatory Visit: Payer: Self-pay

## 2016-11-12 MED ORDER — INSULIN GLARGINE 100 UNIT/ML SOLOSTAR PEN: PEN_INJECTOR | SUBCUTANEOUS | 3 refills | Status: DC

## 2016-11-12 NOTE — Telephone Encounter (Signed)
[facility] called and needed to verify Lantus prescription- I corrected the prescription- just an [place]

## 2016-11-18 ENCOUNTER — Other Ambulatory Visit: Payer: Self-pay

## 2016-11-18 MED ORDER — INSULIN GLARGINE 100 UNIT/ML SOLOSTAR PEN: PEN_INJECTOR | SUBCUTANEOUS | 3 refills | Status: DC

## 2016-11-18 NOTE — Telephone Encounter (Signed)
I have printed this encounter to review with front desk and call pt to apologize

## 2016-12-17 ENCOUNTER — Telehealth: Payer: Self-pay | Admitting: Family Medicine

## 2016-12-17 NOTE — Telephone Encounter (Signed)
Copied from [place] [id number]. Topic: General - Other >> Dec 17, 2016  4:17 PM Valla Leaver wrote: Reason for CRM: Patient calling b/c he forgot the name of the place that Wynetta Fines recommended to get his hearing aids. Please advise.  Per [name] number is [phone] and I did speak with pt, gave this number.

## 2016-12-21 ENCOUNTER — Other Ambulatory Visit: Payer: Self-pay | Admitting: Family Medicine

## 2017-01-03 ENCOUNTER — Encounter: Payer: Self-pay | Admitting: Family Medicine

## 2017-01-03 ENCOUNTER — Ambulatory Visit: Payer: Medicare Other | Admitting: Family Medicine

## 2017-01-03 VITALS — BP 140/78 | HR 63 | Temp 98.3°F | Ht 65.0 in | Wt 232.6 lb

## 2017-01-03 DIAGNOSIS — I10 Essential (primary) hypertension: Secondary | ICD-10-CM

## 2017-01-03 DIAGNOSIS — M10471 Other secondary gout, right ankle and foot: Secondary | ICD-10-CM | POA: Diagnosis not present

## 2017-01-03 DIAGNOSIS — N183 Chronic kidney disease, stage 3 unspecified: Secondary | ICD-10-CM

## 2017-01-03 DIAGNOSIS — I5032 Chronic diastolic (congestive) heart failure: Secondary | ICD-10-CM

## 2017-01-03 DIAGNOSIS — F325 Major depressive disorder, single episode, in full remission: Secondary | ICD-10-CM | POA: Diagnosis not present

## 2017-01-03 LAB — BASIC METABOLIC PANEL
BUN: 26 mg/dL — ABNORMAL HIGH (ref 6–23)
CALCIUM: 9.5 mg/dL (ref 8.4–10.5)
CO2: 33 mEq/L — ABNORMAL HIGH (ref 19–32)
Chloride: 101 mEq/L (ref 96–112)
Creatinine, Ser: 1.65 mg/dL — ABNORMAL HIGH (ref 0.40–1.50)
GFR: 43.63 mL/min — AB (ref >60.00)
Glucose, Bld: 318 mg/dL — ABNORMAL HIGH (ref 70–99)
POTASSIUM: 4.4 meq/L (ref 3.5–5.1)
SODIUM: 141 meq/L (ref 135–145)

## 2017-01-03 LAB — URIC ACID: Uric Acid, Serum: 8.7 mg/dL — ABNORMAL HIGH (ref 4.0–7.8)

## 2017-01-03 NOTE — Patient Instructions (Addendum)
Please stop by lab before you go  Please call Dr. Justin Mend since he is adjusting blood pressure medicines. I agree home readings are too high- may have to consider clonidine again. Max dose of labetalol is 800mg  per day unless Dr. Justin Mend says otherwise

## 2017-01-03 NOTE — Assessment & Plan Note (Signed)
S: remains on bumex 2mg  BID. Edema is stable. Weight  Is largely stable Wt Readings from Last 3 Encounters:  01/03/17 232 lb 9.6 oz (105.5 kg)  11/05/16 233 lb 6.4 oz (105.9 kg)  09/23/16 231 lb (104.8 kg)  A/P:continue current medicine. Appears stable

## 2017-01-03 NOTE — Assessment & Plan Note (Signed)
S: gout flare back in September -MTP right great toe per visit at [facility] Lab Results  Component Value Date   LABURIC 8.7 (H) 01/03/2017  A/P: no uric acid on file- will check today. Certainly elevated- we discussed avoiding trigers- apparently last flare was after [place] and alcohol intake and last one years ago- I think reasonable to stay off uric acid lowering agent unless nephrology would prefer given his CKD III

## 2017-01-03 NOTE — Assessment & Plan Note (Signed)
S: PHQ9 of 3. Remains on paxil 20mg . No SI A/P: continue current medications

## 2017-01-03 NOTE — Assessment & Plan Note (Signed)
S: previously controlled on bumex 2 mg BID, diltiazem 360mg  XR, clonidine 0.2mg  TID, hydralazine TID, labetalol 300mg  BID (states taking TID), irbesartan (now switched to this). Did not tolerate spironolactone due to gynecomastia.   He was taken  off clonidine by Dr. Justin Mend and labetalol increased to 300mg  BID- patient added a TID dose given home BPs Has seen usually 160s to 180s.  BP Readings from Last 3 Encounters:  01/03/17 140/78. 152/70 on my repeat.   11/05/16 134/72  09/23/16 (!) 148/64  A/P: We discussed blood pressure goal of <140/90. Continue current meds:  Patient asks me about changing meds today but I asked him to reach out to Dr. Justin Mend who made most recent addition- not sure if hes ok with clonidine being restarted but that would be my first plan without his aid. With that being said- seems patient was taken off given risk for rebound HTN. Also advised patient labetalol 800mg  per day max

## 2017-01-03 NOTE — Progress Notes (Signed)
Subjective:  Paul [name] is a 71 y.o. year old very pleasant male patient who presents for/with See problem oriented charting ROS- baseline SOB, edema stable. No chest pain. No chest congestion.    Past Medical History-  Patient Active Problem List   Diagnosis Date Noted  . 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 Chronic diastolic heart failure ([place]) 11/26/2013    Priority: High  . History of melanoma 12/18/2012    Priority: High  . Type 2 diabetes, controlled, with renal manifestation ([place]) 07/28/2006    Priority: High  . Essential hypertension 07/28/2006    Priority: High  . CKD (chronic kidney disease), stage III ([place]) 09/19/2014    Priority: Medium  . Obstructive sleep apnea 03/27/2014    Priority: Medium  . Former smoker 01/02/2014    Priority: Medium  . Mild aortic stenosis 11/26/2013    Priority: Medium  . Hyperlipidemia 07/28/2006    Priority: Medium  . Major depression in full remission ([place]) 07/28/2006    Priority: Medium  . BRCA2 positive 02/06/2015    Priority: Low  . Genetic testing 02/06/2015    Priority: Low  . Diverticulosis of colon with hemorrhage 08/10/2013    Priority: Low  . Hx of adenomatous colonic polyps 08/10/2013    Priority: Low  . Erectile dysfunction 09/04/2009    Priority: Low  . Morbid obesity ([place]) 12/28/2007    Priority: Low  . Gout 06/28/2007    Priority: Low  . Anemia 12/27/2006    Priority: Low  . 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 ACCU-CHEK FASTCLIX LANCETS MISC Use to check blood sugar 2 times per day 200 each 2  . atorvastatin (LIPITOR) 20 MG tablet TAKE 1 TABLET BY MOUTH  DAILY AT 6PM. 90 tablet 1  . bumetanide (BUMEX) 2 MG tablet Take 1 tablet by mouth  daily (Patient taking differently: Take 2 tablets in the morning and 1 in the afternoon) 90 tablet 3  . Cholecalciferol (VITAMIN D-3) 1000  UNITS CAPS Take 2,000 Units by mouth daily.    Marland Kitchen diltiazem (CARDIZEM CD) 360 MG 24 hr capsule TAKE 1 CAPSULE BY MOUTH  DAILY WITH BREAKFAST 90 capsule 1  . hydrALAZINE (APRESOLINE) 100 MG tablet Take 100 mg by mouth 3 (three) times daily.     . Insulin Glargine (LANTUS SOLOSTAR) 100 UNIT/ML Solostar Pen Inject 130 units in the skin subQ every morning 40 pen 3  . Insulin Pen Needle 31G X 8 MM MISC Inject insulin two times a day. 200 each 2  . irbesartan (AVAPRO) 300 MG tablet Take 1 tablet (300 mg total) by mouth daily. 90 tablet 3  . labetalol (NORMODYNE) 200 MG tablet TAKE 1 TABLET BY MOUTH TWO  TIMES DAILY (Patient taking differently: TAKE 1 and a half TABLETs BY MOUTH  Three Times DAILY) 180 tablet 1  . Lancet Devices (AUTOLET IMPRESSION) MISC You to check blood sugar 2 times a day 2 each 1  . Multiple Vitamins-Minerals (CENTURY SENIOR) TABS Take 1 tablet by mouth daily.    . Omega-3 Fatty Acids (FISH OIL) 1200 MG CAPS Take 1,200 mg by mouth daily.    Marland Kitchen PARoxetine (PAXIL) 20 MG tablet TAKE 1 TABLET BY MOUTH  DAILY 90 tablet 1  . sildenafil (VIAGRA) 100 MG tablet Take 1 tablet (100 mg total) by mouth daily as needed for erectile dysfunction (Single  Pack requested. May only buy a few pills.). 10 tablet 5  . tadalafil (CIALIS) 20 MG tablet Take 0.5-1 tablets (10-20 mg total) by mouth every other day as needed for erectile dysfunction. 5 tablet 11   No current facility-administered medications for this visit.     Objective: BP 140/78 (BP Location: Left Arm, Patient Position: Sitting, Cuff Size: Large)   Pulse 63   Temp 98.3 F (36.8 C) (Oral)   Ht '5\' 5"'$  (1.651 m)   Wt 232 lb 9.6 oz (105.5 kg)   SpO2 96%   BMI 38.71 kg/m  Gen: NAD, resting comfortably CV: RRR stable 3/6 murmur systolic Lungs: CTAB no crackles, wheeze, rhonchi Abdomen: soft/nontender/nondistended/normal bowel sounds. No rebound or guarding.  Ext: 1+ edema Skin: warm, dry  Assessment/Plan:  Other 1. Follows with Dr.  Loanne Drilling for diabetes. Still not at goal- working toward sthis. We discussed role of improving diet and exercise . 4 month follow up with Dr. Loanne Drilling was advised Lab Results  Component Value Date   HGBA1C 8.7 11/05/2016  2. Aortic stenosis now moderate- follow up 1 year. He is ok with this.   Chronic diastolic heart failure S: remains on bumex '2mg'$  BID. Edema is stable. Weight  Is largely stable Wt Readings from Last 3 Encounters:  01/03/17 232 lb 9.6 oz (105.5 kg)  11/05/16 233 lb 6.4 oz (105.9 kg)  09/23/16 231 lb (104.8 kg)  A/P:continue current medicine. Appears stable  Essential hypertension S: previously controlled on bumex 2 mg BID, diltiazem '360mg'$  XR, clonidine 0.'2mg'$  TID, hydralazine TID, labetalol '300mg'$  BID (states taking TID), irbesartan (now switched to this). Did not tolerate spironolactone due to gynecomastia.   He was taken  off clonidine by Dr. Justin Mend and labetalol increased to '300mg'$  BID- patient added a TID dose given home BPs Has seen usually 160s to 180s.  BP Readings from Last 3 Encounters:  01/03/17 140/78. 152/70 on my repeat.   11/05/16 134/72  09/23/16 (!) 148/64  A/P: We discussed blood pressure goal of <140/90. Continue current meds:  Patient asks me about changing meds today but I asked him to reach out to Dr. Justin Mend who made most recent addition- not sure if hes ok with clonidine being restarted but that would be my first plan without his aid. With that being said- seems patient was taken off given risk for rebound HTN. Also advised patient labetalol '800mg'$  per day max  Major depression in full remission ([place]) S: PHQ9 of 3. Remains on paxil '20mg'$ . No SI A/P: continue current medications  CKD (chronic kidney disease), stage III S: follows with [facility] but Dr. Mercy Moore retiring. We will update bmet.  A/P: GFR stable in mid 40s- continue current medicines and reach out to Dr. Justin Mend about potentially restarting clonidine- need to get better BP control given his  CKD III  Gout S: gout flare back in September -MTP right great toe per visit at [facility] Lab Results  Component Value Date   LABURIC 8.7 (H) 01/03/2017  A/P: no uric acid on file- will check today. Certainly elevated- we discussed avoiding trigers- apparently last flare was after [place] and alcohol intake and last one years ago- I think reasonable to stay off uric acid lowering agent unless nephrology would prefer given his CKD III   Future Appointments  Date Time Provider [place]  03/08/2017 10:00 AM Renato Shin, MD [facility] None  05/04/2017 10:15 AM Marin Olp, MD [facility]   Return in about 4 months (around  05/04/2017) for follow up- or sooner if needed.  Orders Placed This Encounter  Procedures  . Basic metabolic panel    [place]  . Uric acid   Return precautions advised.  Garret Reddish, MD

## 2017-01-03 NOTE — Assessment & Plan Note (Signed)
S: follows with [facility] but Dr. Mercy Moore retiring. We will update bmet.  A/P: GFR stable in mid 40s- continue current medicines and reach out to Dr. Justin Mend about potentially restarting clonidine- need to get better BP control given his CKD III

## 2017-01-04 ENCOUNTER — Encounter: Payer: Self-pay | Admitting: Family Medicine

## 2017-02-01 ENCOUNTER — Telehealth: Payer: Self-pay | Admitting: Family Medicine

## 2017-02-01 NOTE — Telephone Encounter (Signed)
Dr. Yong Channel Pt

## 2017-02-01 NOTE — Telephone Encounter (Signed)
Copied from [place]. Topic: Inquiry >> Feb 01, 2017 11:25 AM Ether Griffins B wrote: Reason for CRM: Randall Hiss with [facility] calling about a medical record code request sent in [facility] for [facility]. I have asked him to fax it to the office his contact number is [phone] ext [phone].  I spoke with pt and he will call insurance company to find out exactly what they need. He will call us back if anything is needed

## 2017-02-03 ENCOUNTER — Telehealth: Payer: Self-pay | Admitting: Family Medicine

## 2017-02-03 NOTE — Telephone Encounter (Signed)
[employer] Hoyle Sauer [phone] manager called, will be faxing over form for [facility] to review, if no changes then check box nothing to add and fax back. I was told this had to do with billing and coding from his office visit back in September.

## 2017-02-08 ENCOUNTER — Encounter: Payer: Self-pay | Admitting: Family Medicine

## 2017-02-09 ENCOUNTER — Encounter: Payer: Self-pay | Admitting: Family Medicine

## 2017-02-14 ENCOUNTER — Ambulatory Visit: Payer: Self-pay | Admitting: *Deleted

## 2017-02-14 NOTE — Telephone Encounter (Signed)
Called patient and left a voicemail message asking for a return phone call. 

## 2017-02-14 NOTE — Telephone Encounter (Signed)
Pt change in medication per kidney provider;  Will route to [place]; pt seen by Dr Yong Channel 01/04/07; pt can be contacted at [phone]. Reason for Disposition . Caller has NON-URGENT medication question about med that PCP prescribed and triager unable to answer question  Protocols used: MEDICATION QUESTION CALL-A-AH

## 2017-02-14 NOTE — Telephone Encounter (Signed)
See note

## 2017-02-18 NOTE — Telephone Encounter (Signed)
Left message with family member for pt to call office.

## 2017-02-23 NOTE — Telephone Encounter (Signed)
Spoke to pt's wife pt not available and she said pt's question was answered by someone before regarding medication kidney doctor prescribed. She said pt has no further questions. Told her okay, thank you.

## 2017-02-28 ENCOUNTER — Ambulatory Visit: Payer: Medicare Other | Admitting: Endocrinology

## 2017-02-28 ENCOUNTER — Encounter: Payer: Self-pay | Admitting: Endocrinology

## 2017-02-28 VITALS — BP 121/70 | HR 57 | Wt 235.0 lb

## 2017-02-28 DIAGNOSIS — E1122 Type 2 diabetes mellitus with diabetic chronic kidney disease: Secondary | ICD-10-CM

## 2017-02-28 DIAGNOSIS — N183 Chronic kidney disease, stage 3 (moderate): Secondary | ICD-10-CM | POA: Diagnosis not present

## 2017-02-28 DIAGNOSIS — Z794 Long term (current) use of insulin: Secondary | ICD-10-CM

## 2017-02-28 LAB — POCT GLYCOSYLATED HEMOGLOBIN (HGB A1C): HEMOGLOBIN A1C: 7.8

## 2017-02-28 NOTE — Progress Notes (Signed)
Subjective:    Patient ID: Paul [name], male    DOB: January 13, 1946, 72 y.o.   MRN: [id number]  HPI Pt returns for f/u of diabetes mellitus: DM type: Insulin-requiring type 2 Dx'ed: 3212 Complications: renal insufficiency and background retinopathy.   Therapy: insulin since 2009 DKA: never.  Severe hypoglycemia: never.   Pancreatitis: never.   Other: he takes qd insulin, after poor results with taking it more frequently; he declines weight loss surgery.   Interval history:  He says he never misses the insulin.  He brings a record of his cbg's which I have reviewed today.  It varies from 61-162.  It is lowest with exercise, on days he could not anticipate it, so he took the full amount.    Past Medical History:  Diagnosis Date  . Colon polyps 12/09/2009   Multiple in descending colon  . Depression   . Diabetes mellitus    type 2  . Diverticulosis 12/09/2009   Moderate  . Hyperlipidemia   . Hypertension   . melanoma dx'd 12/2012   rt arm; surg   . Microalbuminuria     Past Surgical History:  Procedure Laterality Date  . MASS EXCISION  09/29/2011   [facility]-Excision sebaceous cyst on neck  . MELANOMA EXCISION  12/2012  . TONSILLECTOMY AND ADENOIDECTOMY      Social History   Socioeconomic History  . Marital status: Married    Spouse name: Not on file  . Number of children: 4  . Years of education: Not on file  . Highest education level: Not on file  Social Needs  . Financial resource strain: Not on file  . Food insecurity - worry: Not on file  . Food insecurity - inability: Not on file  . Transportation needs - medical: Not on file  . Transportation needs - non-medical: Not on file  Occupational History  . Occupation: Retired    Fish farm manager: [employer]  Tobacco Use  . Smoking status: Former Smoker    Packs/day: 1.50    Years: 45.00    Pack years: 67.50    Types: Cigars, Cigarettes    Last attempt to quit: 09/13/1998    Years since quitting: 18.4  .  Smokeless tobacco: Never Used  Substance and Sexual Activity  . Alcohol use: Yes    Alcohol/week: 0.0 oz    Comment: very little/occ  . Drug use: No  . Sexual activity: No  Other Topics Concern  . Not on file  Social History Narrative   Married 4 kids. 6 grandkids.       Retired from [employer]: travel, reading    Current Outpatient Medications on File Prior to Visit  Medication Sig Dispense Refill  . ACCU-CHEK FASTCLIX LANCETS MISC Use to check blood sugar 2 times per day 200 each 2  . atorvastatin (LIPITOR) 20 MG tablet TAKE 1 TABLET BY MOUTH  DAILY AT 6PM. 90 tablet 1  . bumetanide (BUMEX) 2 MG tablet Take 1 tablet by mouth  daily (Patient taking differently: Take 2 tablets in the morning and 1 in the afternoon) 90 tablet 3  . Cholecalciferol (VITAMIN D-3) 1000 UNITS CAPS Take 2,000 Units by mouth daily.    Marland Kitchen diltiazem (CARDIZEM CD) 360 MG 24 hr capsule TAKE 1 CAPSULE BY MOUTH  DAILY WITH BREAKFAST 90 capsule 1  . hydrALAZINE (APRESOLINE) 100 MG tablet Take 100 mg by mouth 3 (three) times daily. Taking 2 tablets my mouth in the morning  and two in the evening.    . Insulin Glargine (LANTUS SOLOSTAR) 100 UNIT/ML Solostar Pen Inject 130 units in the skin subQ every morning 40 pen 3  . Insulin Pen Needle 31G X 8 MM MISC Inject insulin two times a day. 200 each 2  . irbesartan (AVAPRO) 300 MG tablet Take 1 tablet (300 mg total) by mouth daily. 90 tablet 3  . labetalol (NORMODYNE) 200 MG tablet TAKE 1 TABLET BY MOUTH TWO  TIMES DAILY (Patient taking differently: TAKE 1 and a half tablets by mouth in the morining and evening. Take 1 tablet by mouth in the afternoon.) 180 tablet 1  . Lancet Devices (AUTOLET IMPRESSION) MISC You to check blood sugar 2 times a day 2 each 1  . Multiple Vitamins-Minerals (CENTURY SENIOR) TABS Take 1 tablet by mouth daily.    . Omega-3 Fatty Acids (FISH OIL) 1200 MG CAPS Take 1,200 mg by mouth daily.    Marland Kitchen PARoxetine (PAXIL) 20 MG tablet TAKE 1 TABLET  BY MOUTH  DAILY 90 tablet 1  . sildenafil (VIAGRA) 100 MG tablet Take 1 tablet (100 mg total) by mouth daily as needed for erectile dysfunction (Single Pack requested. May only buy a few pills.). 10 tablet 5  . tadalafil (CIALIS) 20 MG tablet Take 0.5-1 tablets (10-20 mg total) by mouth every other day as needed for erectile dysfunction. 5 tablet 11   No current facility-administered medications on file prior to visit.     Allergies  Allergen Reactions  . Amlodipine Swelling    Family History  Problem Relation Age of Onset  . Heart attack Mother        30 from chemo  . Breast cancer Mother        dx. <68; +chemo  . Hypertension Father   . Cerebral aneurysm Father   . BRCA 1/2 Maternal Aunt        never tested, but daughter has known mutation in BRCA2  . Kidney failure Maternal Aunt   . Stomach cancer Maternal Uncle        d. 27; smoker and issues with heartburn  . Heart Problems Paternal Grandfather   . BRCA 1/2 Cousin        BRCA2+; maternal 1st cousin; first person tested in family  . Breast cancer Cousin 59    BP 121/70 (BP Location: Left Arm, Patient Position: Sitting, Cuff Size: Normal)   Pulse (!) 57   Wt 235 lb (106.6 kg)   SpO2 95%   BMI 39.11 kg/m    Review of Systems He denies LOC.      Objective:   Physical Exam VITAL SIGNS:  See vs page GENERAL: no distress Pulses: foot pulses are intact bilaterally.   MSK: no deformity of the feet or ankles.  CV: 1+ bilat edema of the legs or ankles Skin:  no ulcer on the feet or ankles-- a recent skin surg site (few mm) is healing at the dorsal aspect of the left foot.  normal color and temp on the feet and ankles Neuro: sensation is intact to touch on the feet and ankles.    Lab Results  Component Value Date   HGBA1C 7.8 02/28/2017   Lab Results  Component Value Date   CREATININE 1.65 (H) 01/03/2017   BUN 26 (H) 01/03/2017   NA 141 01/03/2017   K 4.4 01/03/2017   CL 101 01/03/2017   CO2 33 (H) 01/03/2017        Assessment & Plan:  Insulin-requiring type 2 DM, with DR:  this is the best control this pt should aim for, given this regimen, which does match insulin to her changing needs throughout the day Hypoglycemia: this limits aggressiveness of glycemic control Renal insuff: this places pt at risk for further hypoglycemia  Patient Instructions  check your blood sugar twice a day.  vary the time of day when you check, between before the 3 meals, and at bedtime.  also check if you have symptoms of your blood sugar being too high or too low.  please keep a record of the readings and bring it to your next appointment here.  please call us sooner if your blood sugar goes below 70, or if you have a lot of readings over 200.   On this type of insulin schedule, you should eat meals on a regular schedule.  If a meal is missed or significantly delayed, your blood sugar could go low.    Please continue the same insulin: 130 units each morning.  However, if you are going to be active, take just 80 units that morning. Also, if you cannot anticipate the activity, eat a light snack with it.  Please come back for a follow-up appointment in 4 months.

## 2017-02-28 NOTE — Patient Instructions (Addendum)
check your blood sugar twice a day.  vary the time of day when you check, between before the 3 meals, and at bedtime.  also check if you have symptoms of your blood sugar being too high or too low.  please keep a record of the readings and bring it to your next appointment here.  please call us sooner if your blood sugar goes below 70, or if you have a lot of readings over 200.   On this type of insulin schedule, you should eat meals on a regular schedule.  If a meal is missed or significantly delayed, your blood sugar could go low.    Please continue the same insulin: 130 units each morning.  However, if you are going to be active, take just 80 units that morning. Also, if you cannot anticipate the activity, eat a light snack with it.  Please come back for a follow-up appointment in 4 months.

## 2017-03-08 ENCOUNTER — Ambulatory Visit: Payer: Medicare Other | Admitting: Endocrinology

## 2017-03-16 ENCOUNTER — Other Ambulatory Visit: Payer: Self-pay | Admitting: *Deleted

## 2017-03-16 MED ORDER — ACCU-CHEK GUIDE W/DEVICE KIT: 1.0000 | PACK | Freq: Two times a day (BID) | 0 refills | Status: DC

## 2017-03-30 ENCOUNTER — Telehealth: Payer: Self-pay

## 2017-03-30 NOTE — Telephone Encounter (Signed)
Received a notice from [facility] that PARoxetine (PAXIL) 20 MG tablet has been approved through 01/17/2018 under Medicare Part D benefit

## 2017-03-31 ENCOUNTER — Telehealth: Payer: Self-pay

## 2017-03-31 NOTE — Telephone Encounter (Signed)
Received a notice from the pharmacy that irbesartan is currently out of stock and needs an alternative prescription. Please advise

## 2017-04-01 ENCOUNTER — Other Ambulatory Visit: Payer: Self-pay

## 2017-04-01 MED ORDER — TELMISARTAN 80 MG PO TABS: 80.0000 mg | ORAL_TABLET | Freq: Every day | ORAL | 3 refills | Status: DC

## 2017-04-01 NOTE — Telephone Encounter (Signed)
Rx has been sent to patient's pharmacy. 

## 2017-04-01 NOTE — Telephone Encounter (Signed)
Lets try telmisartan 80mg  daily #90 with 3 refills

## 2017-05-04 ENCOUNTER — Encounter: Payer: Self-pay | Admitting: Family Medicine

## 2017-05-04 ENCOUNTER — Ambulatory Visit: Payer: Medicare Other | Admitting: Family Medicine

## 2017-05-04 VITALS — BP 160/80 | HR 62 | Temp 98.3°F | Ht 65.0 in | Wt 233.6 lb

## 2017-05-04 DIAGNOSIS — M10471 Other secondary gout, right ankle and foot: Secondary | ICD-10-CM

## 2017-05-04 DIAGNOSIS — R609 Edema, unspecified: Secondary | ICD-10-CM | POA: Diagnosis not present

## 2017-05-04 DIAGNOSIS — F325 Major depressive disorder, single episode, in full remission: Secondary | ICD-10-CM

## 2017-05-04 DIAGNOSIS — I10 Essential (primary) hypertension: Secondary | ICD-10-CM

## 2017-05-04 DIAGNOSIS — R600 Localized edema: Secondary | ICD-10-CM

## 2017-05-04 DIAGNOSIS — I5032 Chronic diastolic (congestive) heart failure: Secondary | ICD-10-CM | POA: Diagnosis not present

## 2017-05-04 NOTE — Progress Notes (Signed)
Subjective:  Paul [name] is a 72 y.o. year old very pleasant male patient who presents for/with See problem oriented charting ROS-no chest pain.  Baseline shortness of breath with no recent increase.  No increased edema.  No blurred vision  Past Medical History-  Patient Active Problem List   Diagnosis Date Noted  . 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 Chronic diastolic heart failure ([place]) 11/26/2013    Priority: High  . History of melanoma 12/18/2012    Priority: High  . Type 2 diabetes, controlled, with renal manifestation ([place]) 07/28/2006    Priority: High  . Essential hypertension 07/28/2006    Priority: High  . CKD (chronic kidney disease), stage III ([place]) 09/19/2014    Priority: Medium  . Obstructive sleep apnea 03/27/2014    Priority: Medium  . Former smoker 01/02/2014    Priority: Medium  . Mild aortic stenosis 11/26/2013    Priority: Medium  . Hyperlipidemia 07/28/2006    Priority: Medium  . Major depression in full remission ([place]) 07/28/2006    Priority: Medium  . BRCA2 positive 02/06/2015    Priority: Low  . Genetic testing 02/06/2015    Priority: Low  . Diverticulosis of colon with hemorrhage 08/10/2013    Priority: Low  . Hx of adenomatous colonic polyps 08/10/2013    Priority: Low  . Erectile dysfunction 09/04/2009    Priority: Low  . Morbid obesity ([place]) 12/28/2007    Priority: Low  . Gout 06/28/2007    Priority: Low  . Anemia 12/27/2006    Priority: Low  . 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 ACCU-CHEK FASTCLIX LANCETS MISC Use to check blood sugar 2 times per day 200 each 2  . atorvastatin (LIPITOR) 20 MG tablet TAKE 1 TABLET BY MOUTH  DAILY AT 6PM. 90 tablet 1  . Blood Glucose Monitoring Suppl (ACCU-CHEK GUIDE) w/Device KIT 1 each by Does not apply route 2 (two) times daily. 1 kit 0  . bumetanide (BUMEX) 2 MG  tablet Take 1 tablet by mouth  daily (Patient taking differently: Take 2 tablets in the morning and 1 in the afternoon) 90 tablet 3  . Cholecalciferol (VITAMIN D-3) 1000 UNITS CAPS Take 2,000 Units by mouth daily.    Marland Kitchen diltiazem (CARDIZEM CD) 360 MG 24 hr capsule TAKE 1 CAPSULE BY MOUTH  DAILY WITH BREAKFAST 90 capsule 1  . hydrALAZINE (APRESOLINE) 100 MG tablet Take 100 mg by mouth 3 (three) times daily. Taking 2 tablets my mouth in the morning and two in the evening.    . Insulin Glargine (LANTUS SOLOSTAR) 100 UNIT/ML Solostar Pen Inject 130 units in the skin subQ every morning 40 pen 3  . Insulin Pen Needle 31G X 8 MM MISC Inject insulin two times a day. 200 each 2  . labetalol (NORMODYNE) 200 MG tablet TAKE 1 TABLET BY MOUTH TWO  TIMES DAILY (Patient taking differently: TAKE 1 and a half tablets by mouth in the morining and evening. Take 1 tablet by mouth in the afternoon.) 180 tablet 1  . Lancet Devices (AUTOLET IMPRESSION) MISC You to check blood sugar 2 times a day 2 each 1  . Multiple Vitamins-Minerals (CENTURY SENIOR) TABS Take 1 tablet by mouth daily.    . Omega-3 Fatty Acids (FISH OIL) 1200 MG CAPS Take 1,200 mg by mouth daily.    Marland Kitchen PARoxetine (PAXIL) 20 MG  tablet TAKE 1 TABLET BY MOUTH  DAILY 90 tablet 1  . sildenafil (VIAGRA) 100 MG tablet Take 1 tablet (100 mg total) by mouth daily as needed for erectile dysfunction (Single Pack requested. May only buy a few pills.). 10 tablet 5  . tadalafil (CIALIS) 20 MG tablet Take 0.5-1 tablets (10-20 mg total) by mouth every other day as needed for erectile dysfunction. 5 tablet 11  . telmisartan (MICARDIS) 80 MG tablet Take 1 tablet (80 mg total) by mouth daily. 90 tablet 3   No current facility-administered medications for this visit.     Objective: BP (!) 160/80 (BP Location: Left Arm, Patient Position: Sitting, Cuff Size: Large) Comment: Has not taken BP meds this AM  Pulse 62   Temp 98.3 F (36.8 C) (Oral)   Ht '5\' 5"'$  (1.651 m)   Wt 233  lb 9.6 oz (106 kg)   SpO2 96%   BMI 38.87 kg/m  Gen: NAD, resting comfortably CV: RRR no murmurs rubs or gallops Lungs: CTAB no crackles, wheeze, rhonchi Abdomen: soft/nontender/nondistended/normal bowel sounds.  Obese Ext: 1+ edema Skin: warm, dry Neuro: Normal speech  Assessment/Plan:  Salivary gland swelling - Plan: Ambulatory referral to ENT S: when he eats for last 2 months gets about half an egg sized enlargement sounds like of salivary gland A/P: We will refer to Ent for further evaluation.  No obvious obstructing lesion on exam today  Major depression in full remission ([place]) S: phq9 remains controlled on paxil '20mg'$  at 2. No SI A/P: Full remission-continue current medication  Gout S: uric acid elevated but rare gout flare unless big dietary indiscretion. No recent gout flares.  A/P: Continue without medication, continue to watch diet  Essential hypertension S: Patient did not take his medication yet this morning.  Controlled poorly on initial and repeat check on bumex '4mg'$  AM and '2mg'$  PMD, diltiazem '360mg'$  XR , hydralazine '100mg'$  AM and '200mg'$  before bed, labetalol '300mg'$  TID, telmisartan '80mg'$   He states ome Am pressures above 150 before he takes his meds but by evening look great in 120s and 130s  Spironolactone= gynecomastia BP Readings from Last 3 Encounters:  05/04/17 (!) 160/80  02/28/17 121/70  01/03/17 140/78  A/P: I suspect he is controlled for the most part.  Poor control today because he is not taking medication.  Poor control in the mornings before his blood pressure medicine- encouraged him to take his medication at least an hour or 2 before  checking blood pressure.  Continue current meds:  But Only change today- take telmisartan at bedtime instead of with dinner to see if we can provide more balance in early AM visits  Chronic diastolic heart failure S: remains on bumex 4 mg AM and 2 mg PM with stable edema and weight. Staying active- either at gym or doing yard  work (several hours a day). States losing inches though hasnt lost much weight.   inclines bother him for SOB A/P: Fluid balance appears stable.  Continue current medication  Morbid obesity (HCC) HTN, HLD, DM and BMI >35.   advised healthy lifestyle modification.  Would be very pleased with just a few pound weight loss by next visit if possible.  Future Appointments  Date Time Provider [place]  05/26/2017  1:00 PM Williemae Area, RN [facility] [place]  06/22/2017 10:15 AM Renato Shin, MD [facility] None  09/05/2017  9:30 AM Marin Olp, MD [facility]   Return in about 4 months (around 09/03/2017) for physical.  Lab/Order associations: Salivary gland swelling - Plan: Ambulatory referral to ENT  Major depressive disorder with single episode, in full remission ([place])  Return precautions advised.  Garret Reddish, MD

## 2017-05-04 NOTE — Patient Instructions (Addendum)
Only change today- take telmisartan at bedtime. Perhaps also do some checks an hour or two after your medicine in the morning to make sure coming down quickly.   We will call you within a week or two about your referral to Ear, nose and throat doctors. If you do not hear within 3 weeks, give [place] a call.

## 2017-05-06 NOTE — Assessment & Plan Note (Signed)
HTN, HLD, DM and BMI >35.   advised healthy lifestyle modification.  Would be very pleased with just a few pound weight loss by next visit if possible.

## 2017-05-06 NOTE — Assessment & Plan Note (Signed)
S: Patient did not take his medication yet this morning.  Controlled poorly on initial and repeat check on bumex 4mg  AM and 2mg  PMD, diltiazem 360mg  XR , hydralazine 100mg  AM and 200mg  before bed, labetalol 300mg  TID, telmisartan 80mg   He states ome Am pressures above 150 before he takes his meds but by evening look great in 120s and 130s  Spironolactone= gynecomastia BP Readings from Last 3 Encounters:  05/04/17 (!) 160/80  02/28/17 121/70  01/03/17 140/78  A/P: I suspect he is controlled for the most part.  Poor control today because he is not taking medication.  Poor control in the mornings before his blood pressure medicine- encouraged him to take his medication at least an hour or 2 before  checking blood pressure.  Continue current meds:  But Only change today- take telmisartan at bedtime instead of with dinner to see if we can provide more balance in early AM visits

## 2017-05-06 NOTE — Assessment & Plan Note (Signed)
S: phq9 remains controlled on paxil 20mg  at 2. No SI A/P: Full remission-continue current medication

## 2017-05-06 NOTE — Assessment & Plan Note (Signed)
S: remains on bumex 4 mg AM and 2 mg PM with stable edema and weight. Staying active- either at gym or doing yard work (several hours a day). States losing inches though hasnt lost much weight.   inclines bother him for SOB A/P: Fluid balance appears stable.  Continue current medication

## 2017-05-06 NOTE — Assessment & Plan Note (Signed)
S: uric acid elevated but rare gout flare unless big dietary indiscretion. No recent gout flares.  A/P: Continue without medication, continue to watch diet

## 2017-05-25 NOTE — Progress Notes (Signed)
Subjective:   Paul [name] is a 72 y.o. male who presents for Medicare Annual/Subsequent preventive examination.  Reports health is better than last year  Dm   Just seen by Dr. 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  PSA 08/2016  DM eye exam 05/2016   Colonoscopy 09/2013   Cardiac Risk Factors include: advanced age (>53mn, >>49women);diabetes mellitus;dyslipidemia;family history of premature cardiovascular disease;hypertension;male gender;obesity (BMI >30kg/m2);microalbuminuria   [place]     Objective:    Vitals: BP 132/70   Pulse (!) 54   Ht '5\' 5"'$  (1.651 m)   Wt 233 lb (105.7 kg)   SpO2 96%   BMI 38.77 kg/m   Body mass index is 38.77 kg/m.  Advanced Directives 05/26/2017 05/25/2016 05/25/2016 03/18/2014 07/26/2013  Does Patient Have a Medical Advance Directive? Yes Yes Yes Yes Patient has advance directive, copy not in chart  Type of Advance Directive - - - Living will Living will  Does patient want to make changes to medical advance directive? - - - No - Patient declined No change requested  Copy of [place] Chart? - - - No - copy requested Copy requested from family  Pre-existing out of facility DNR order (yellow form or pink MOST form) - - - - No    Tobacco Social History   Tobacco Use  Smoking Status Former Smoker  . Packs/day: 1.50  . Years: 45.00  . Pack years: 67.50  . Types: Cigars, Cigarettes  . Last attempt  to quit: 09/13/1998  . Years since quitting: 18.7  Smokeless Tobacco Never Used  Tobacco Comment   had CT of abd and pelvis      Counseling given: Yes Comment: had CT of abd and pelvis    Clinical Intake:     Past Medical History:  Diagnosis Date  . Colon polyps 12/09/2009   Multiple in descending colon  . Depression   . Diabetes mellitus    type 2  . Diverticulosis 12/09/2009   Moderate  . Hyperlipidemia   . Hypertension   . melanoma dx'd 12/2012   rt arm; surg   . Microalbuminuria    Past Surgical History:  Procedure Laterality Date  . MASS EXCISION  09/29/2011   [facility]-Excision sebaceous cyst on neck  . MELANOMA EXCISION  12/2012  . TONSILLECTOMY AND ADENOIDECTOMY     Family History  Problem Relation Age of Onset  . Heart attack Mother        752from chemo  . Breast cancer Mother        dx. <68; +chemo  . Hypertension Father   . Cerebral aneurysm Father   . BRCA 1/2 Maternal Aunt        never tested, but daughter has known mutation in BRCA2  . Kidney failure Maternal Aunt   .  Stomach cancer Maternal Uncle        d. 53; smoker and issues with heartburn  . Heart Problems Paternal Grandfather   . BRCA 1/2 Cousin        BRCA2+; maternal 1st cousin; first person tested in family  . Breast cancer Cousin 85   Social History   Socioeconomic History  . Marital status: Married    Spouse name: Not on file  . Number of children: 4  . Years of education: Not on file  . Highest education level: Not on file  Occupational History  . Occupation: Retired    Fish farm manager: [employer]  . Financial resource strain: Not on file  . Food insecurity:    Worry: Not on file    Inability: Not on file  . Transportation needs:    Medical: Not on file    Non-medical: Not on file  Tobacco Use  . Smoking status: Former Smoker    Packs/day: 1.50    Years: 45.00    Pack years: 67.50    Types: Cigars, Cigarettes    Last attempt to quit: 09/13/1998    Years  since quitting: 18.7  . Smokeless tobacco: Never Used  . Tobacco comment: had CT of abd and pelvis   Substance and Sexual Activity  . Alcohol use: Yes    Alcohol/week: 0.0 oz    Comment: very little/occ  . Drug use: No  . Sexual activity: Never  Lifestyle  . Physical activity:    Days per week: Not on file    Minutes per session: Not on file  . Stress: Not on file  Relationships  . Social connections:    Talks on phone: Not on file    Gets together: Not on file    Attends religious service: Not on file    Active member of club or organization: Not on file    Attends meetings of clubs or organizations: Not on file    Relationship status: Not on file  Other Topics Concern  . Not on file  Social History Narrative   Married 4 kids. 6 grandkids.       Retired from [employer]: travel, reading    Outpatient Encounter Medications as of 05/26/2017  Medication Sig  . ACCU-CHEK FASTCLIX LANCETS MISC Use to check blood sugar 2 times per day  . atorvastatin (LIPITOR) 20 MG tablet TAKE 1 TABLET BY MOUTH  DAILY AT 6PM.  . Blood Glucose Monitoring Suppl (ACCU-CHEK GUIDE) w/Device KIT 1 each by Does not apply route 2 (two) times daily.  . bumetanide (BUMEX) 2 MG tablet Take 1 tablet by mouth  daily (Patient taking differently: Take 2 tablets in the morning and 1 in the afternoon)  . Cholecalciferol (VITAMIN D-3) 1000 UNITS CAPS Take 2,000 Units by mouth daily.  Marland Kitchen diltiazem (CARDIZEM CD) 360 MG 24 hr capsule TAKE 1 CAPSULE BY MOUTH  DAILY WITH BREAKFAST  . hydrALAZINE (APRESOLINE) 100 MG tablet Take 100 mg by mouth 3 (three) times daily. Taking 2 tablets my mouth in the morning and two in the evening.  . Insulin Glargine (LANTUS SOLOSTAR) 100 UNIT/ML Solostar Pen Inject 130 units in the skin subQ every morning  . Insulin Pen Needle 31G X 8 MM MISC Inject insulin two times a day.  . labetalol (NORMODYNE) 200 MG tablet TAKE 1 TABLET BY MOUTH TWO  TIMES DAILY (Patient taking  differently: TAKE 1 and a half tablets by mouth in  the morining and evening. Take 1 tablet by mouth in the afternoon.)  . Lancet Devices (AUTOLET IMPRESSION) MISC You to check blood sugar 2 times a day  . Multiple Vitamins-Minerals (CENTURY SENIOR) TABS Take 1 tablet by mouth daily.  . Omega-3 Fatty Acids (FISH OIL) 1200 MG CAPS Take 1,200 mg by mouth daily.  Marland Kitchen PARoxetine (PAXIL) 20 MG tablet TAKE 1 TABLET BY MOUTH  DAILY  . sildenafil (VIAGRA) 100 MG tablet Take 1 tablet (100 mg total) by mouth daily as needed for erectile dysfunction (Single Pack requested. May only buy a few pills.).  Marland Kitchen tadalafil (CIALIS) 20 MG tablet Take 0.5-1 tablets (10-20 mg total) by mouth every other day as needed for erectile dysfunction.  Marland Kitchen telmisartan (MICARDIS) 80 MG tablet Take 1 tablet (80 mg total) by mouth daily.   No facility-administered encounter medications on file as of 05/26/2017.     Activities of Daily Living In your present state of health, do you have any difficulty performing the following activities: 05/26/2017  Hearing? Y  Comment had hearing aids  Vision? N  Difficulty concentrating or making decisions? N  Walking or climbing stairs? N  Dressing or bathing? N  Doing errands, shopping? N  Preparing Food and eating ? N  Using the Toilet? N  In the past six months, have you accidently leaked urine? N  Do you have problems with loss of bowel control? N  Managing your Medications? N  Managing your Finances? N  Housekeeping or managing your Housekeeping? N  Some recent data might be hidden    Patient Care Team: Marin Olp, MD as PCP - General (Family Medicine)   Assessment:   This is a routine wellness examination for Lain.  Exercise Activities and Dietary recommendations Current Exercise Habits: Home exercise routine;Structured exercise class, Type of exercise: walking;strength training/weights, Time (Minutes): 60, Frequency (Times/Week): 5, Weekly Exercise (Minutes/Week): 300,  Intensity: Moderate  Goals    . Weight (lb) < 200 lb (90.7 kg)     Keep it up Try to incorporate  the plant based food  Drink water!!   Check out  online nutrition programs as GumSearch.nl and http://vang.com/; fit61m; Look for foods with "whole" wheat; bran; oatmeal etc Shot at the farmer's markets in season for fresher choices  Watch for "hydrogenated" on the label of oils which are trans-fats.  Watch for "high fructose corn syrup" in snacks, yogurt or ketchup  Meats have less marbling; bright colored fruits and vegetables;  Canned; dump out liquid and wash vegetables. Be mindful of what we are eating  Portion control is essential to a health weight! Sit down; take a break and enjoy your meal; take smaller bites; put the fork down between bites;  It takes 20 minutes to get full; so check in with your fullness cues and stop eating when you start to fill full              Fall Risk Fall Risk  05/26/2017 09/03/2016 05/25/2016 05/11/2016 09/19/2014  Falls in the past year? No No No No No     Depression Screen PHQ 2/9 Scores 05/26/2017 05/04/2017 01/03/2017 09/03/2016  PHQ - 2 Score 0 0 1 0  PHQ- 9 Score - 2 3 -    Cognitive Function Ad8 score reviewed for issues:  Issues making decisions:  Less interest in hobbies / activities:  Repeats questions, stories (family complaining):  Trouble using ordinary gadgets (microwave, computer, phone):  Forgets the month or year:   Mismanaging  finances:   Remembering appts:  Daily problems with thinking and/or memory: Ad8 score is=0     MMSE - Mini Mental State Exam 05/26/2017  Not completed: (No Data)        Immunization History  Administered Date(s) Administered  . Influenza Split 10/18/2011  . Influenza Whole 10/19/2006, 10/18/2009  . Influenza, High Dose Seasonal PF 10/31/2012, 11/05/2016  . Influenza,inj,Quad PF,6+ Mos 10/22/2013, 09/19/2014  . Pneumococcal Conjugate-13 10/22/2013  . Pneumococcal  Polysaccharide-23 09/11/2008  . Tdap 10/07/2010  . Zoster 10/15/2010      Screening Tests Health Maintenance  Topic Date Due  . OPHTHALMOLOGY EXAM  06/07/2017  . INFLUENZA VACCINE  08/18/2017  . HEMOGLOBIN A1C  08/28/2017  . FOOT EXAM  02/28/2018  . COLONOSCOPY  09/27/2018  . TETANUS/TDAP  10/06/2020  . PNA vac Low Risk Adult  Completed  . Hepatitis C Screening  Addressed         Plan:      PCP Notes   Health Maintenance shingrix education There are no preventive care reminders to display for this patient.  Hearing aids working well   Abnormal Screens  None Memory not perfect but functional Judgement is intact    Referrals  none  Patient concerns; Salivary gland evaluated by ENT; started antibiotic , water and tart candies to increase saliva  To note, [facility] and did remove a melanoma on his scalp per his report earlier this year  Nurse Concerns; Med changes per nephrologist but was not sure Bumetanide 2 mg am , one in the afternoon Apresoline 2 x daily Labetalol ; 1 am. 1 afternoon and 1.5 in the evening Will bring a copy of current meds when he comes back in or may email Diona Browner   Next PCP apt 09/05/2017       I have personally reviewed and noted the following in the patient's chart:   . Medical and social history . Use of alcohol, tobacco or illicit drugs  . Current medications and supplements . Functional ability and status . Nutritional status . Physical activity . Advanced directives . List of other physicians . Hospitalizations, surgeries, and ER visits in previous 12 months . Vitals . Screenings to include cognitive, depression, and falls . Referrals and appointments  In addition, I have reviewed and discussed with patient certain preventive protocols, quality metrics, and best practice recommendations. A written personalized care plan for preventive services as well as general  preventive health recommendations were provided to patient.     Wynetta Fines, RN  05/26/2017

## 2017-05-26 ENCOUNTER — Ambulatory Visit (INDEPENDENT_AMBULATORY_CARE_PROVIDER_SITE_OTHER): Payer: Medicare Other | Admitting: *Deleted

## 2017-05-26 ENCOUNTER — Encounter: Payer: Self-pay | Admitting: *Deleted

## 2017-05-26 VITALS — BP 132/70 | HR 54 | Ht 65.0 in | Wt 233.0 lb

## 2017-05-26 DIAGNOSIS — Z0000 Encounter for general adult medical examination without abnormal findings: Secondary | ICD-10-CM | POA: Diagnosis not present

## 2017-05-26 NOTE — Progress Notes (Signed)
I have reviewed and agree with note, evaluation, plan.  I appreciate clarification of medication effort.  Garret Reddish, MD

## 2017-05-26 NOTE — Patient Instructions (Addendum)
Paul [name] , Thank you for taking time to come for your Medicare Wellness Visit. I appreciate your ongoing commitment to your health goals. Please review the following plan we discussed and let me know if I can assist you in the future.   Will send [name] accurate med via my chart  email [email or web address] My chart; Mouhamad Teed to send you the infor on Libre  GLP1 ( new med once a week) just for you to look at for interest  Will discuss insulin and exercise and the type of exercise with dr. Lilli Few system to evaluate   Shingrix is a vaccine for the prevention of Shingles in Adults 50 and older.  If you are on Medicare, the shingrix is covered under your Part D plan, so you will take both of the vaccines in the series at your pharmacy. Please check with your benefits regarding applicable copays or out of pocket expenses.  The Shingrix is given in 2 vaccines approx 8 weeks apart. You must receive the 2nd dose prior to 6 months from receipt of the first. Please have the pharmacist print out you Immunization  dates for our office records      These are the goals we discussed: Goals    . Weight (lb) < 200 lb (90.7 kg)     Keep it up Try to incorporate  the plant based food  Drink water!!   Check out  online nutrition programs as GumSearch.nl and http://vang.com/; fit59me; Look for foods with "whole" wheat; bran; oatmeal etc Shot at the farmer's markets in season for fresher choices  Watch for "hydrogenated" on the label of oils which are trans-fats.  Watch for "high fructose corn syrup" in snacks, yogurt or ketchup  Meats have less marbling; bright colored fruits and vegetables;  Canned; dump out liquid and wash vegetables. Be mindful of what we are eating  Portion control is essential to a health weight! Sit down; take a break and enjoy your meal; take smaller bites; put the fork down between bites;  It takes 20 minutes to get full; so check in with your fullness cues and stop  eating when you start to fill full              This is a list of the screening recommended for you and due dates:  Health Maintenance  Topic Date Due  . Eye exam for diabetics  06/07/2017  . Flu Shot  08/18/2017  . Hemoglobin A1C  08/28/2017  . Complete foot exam   02/28/2018  . Colon Cancer Screening  09/27/2018  . Tetanus Vaccine  10/06/2020  . Pneumonia vaccines  Completed  .  Hepatitis C: One time screening is recommended by Center for Disease Control  (CDC) for  adults born from [birth year] through 1967.   Addressed      Fall Prevention in the Home Falls can cause injuries. They can happen to people of all ages. There are many things you can do to make your home safe and to help prevent falls. What can I do on the outside of my home?  Regularly fix the edges of walkways and driveways and fix any cracks.  Remove anything that might make you trip as you walk through a door, such as a raised step or threshold.  Trim any bushes or trees on the path to your home.  Use bright outdoor lighting.  Clear any walking paths of anything that might make someone trip, such as rocks or tools.  Regularly  check to see if handrails are loose or broken. Make sure that both sides of any steps have handrails.  Any raised decks and porches should have guardrails on the edges.  Have any leaves, snow, or ice cleared regularly.  Use sand or salt on walking paths during winter.  Clean up any spills in your garage right away. This includes oil or grease spills. What can I do in the bathroom?  Use night lights.  Install grab bars by the toilet and in the tub and shower. Do not use towel bars as grab bars.  Use non-skid mats or decals in the tub or shower.  If you need to sit down in the shower, use a plastic, non-slip stool.  Keep the floor dry. Clean up any water that spills on the floor as soon as it happens.  Remove soap buildup in the tub or shower regularly.  Attach bath mats  securely with double-sided non-slip rug tape.  Do not have throw rugs and other things on the floor that can make you trip. What can I do in the bedroom?  Use night lights.  Make sure that you have a light by your bed that is easy to reach.  Do not use any sheets or blankets that are too big for your bed. They should not hang down onto the floor.  Have a firm chair that has side arms. You can use this for support while you get dressed.  Do not have throw rugs and other things on the floor that can make you trip. What can I do in the kitchen?  Clean up any spills right away.  Avoid walking on wet floors.  Keep items that you use a lot in easy-to-reach places.  If you need to reach something above you, use a strong step stool that has a grab bar.  Keep electrical cords out of the way.  Do not use floor polish or wax that makes floors slippery. If you must use wax, use non-skid floor wax.  Do not have throw rugs and other things on the floor that can make you trip. What can I do with my stairs?  Do not leave any items on the stairs.  Make sure that there are handrails on both sides of the stairs and use them. Fix handrails that are broken or loose. Make sure that handrails are as long as the stairways.  Check any carpeting to make sure that it is firmly attached to the stairs. Fix any carpet that is loose or worn.  Avoid having throw rugs at the top or bottom of the stairs. If you do have throw rugs, attach them to the floor with carpet tape.  Make sure that you have a light switch at the top of the stairs and the bottom of the stairs. If you do not have them, ask someone to add them for you. What else can I do to help prevent falls?  Wear shoes that: ? Do not have high heels. ? Have rubber bottoms. ? Are comfortable and fit you well. ? Are closed at the toe. Do not wear sandals.  If you use a stepladder: ? Make sure that it is fully opened. Do not climb a closed  stepladder. ? Make sure that both sides of the stepladder are locked into place. ? Ask someone to hold it for you, if possible.  Clearly mark and make sure that you can see: ? Any grab bars or handrails. ? First and last  steps. ? Where the edge of each step is.  Use tools that help you move around (mobility aids) if they are needed. These include: ? Canes. ? Walkers. ? Scooters. ? Crutches.  Turn on the lights when you go into a dark area. Replace any light bulbs as soon as they burn out.  Set up your furniture so you have a clear path. Avoid moving your furniture around.  If any of your floors are uneven, fix them.  If there are any pets around you, be aware of where they are.  Review your medicines with your doctor. Some medicines can make you feel dizzy. This can increase your chance of falling. Ask your doctor what other things that you can do to help prevent falls. This information is not intended to replace advice given to you by your health care provider. Make sure you discuss any questions you have with your health care provider. Document Released: 10/31/2008 Document Revised: 06/12/2015 Document Reviewed: 02/08/2014 Elsevier Interactive Patient Education  2018 [place] Maintenance, Male A healthy lifestyle and preventive care is important for your health and wellness. Ask your health care provider about what schedule of regular examinations is right for you. What should I know about weight and diet? Eat a Healthy Diet  Eat plenty of vegetables, fruits, whole grains, low-fat dairy products, and lean protein.  Do not eat a lot of foods high in solid fats, added sugars, or salt.  Maintain a Healthy Weight Regular exercise can help you achieve or maintain a healthy weight. You should:  Do at least 150 minutes of exercise each week. The exercise should increase your heart rate and make you sweat (moderate-intensity exercise).  Do strength-training  exercises at least twice a week.  Watch Your Levels of Cholesterol and Blood Lipids  Have your blood tested for lipids and cholesterol every 5 years starting at 74 years of age. If you are at high risk for heart disease, you should start having your blood tested when you are 74 years old. You may need to have your cholesterol levels checked more often if: ? Your lipid or cholesterol levels are high. ? You are older than 74 years of age. ? You are at high risk for heart disease.  What should I know about cancer screening? Many types of cancers can be detected early and may often be prevented. Lung Cancer  You should be screened every year for lung cancer if: ? You are a current smoker who has smoked for at least 30 years. ? You are a former smoker who has quit within the past 15 years.  Talk to your health care provider about your screening options, when you should start screening, and how often you should be screened.  Colorectal Cancer  Routine colorectal cancer screening usually begins at 74 years of age and should be repeated every 5-10 years until you are 74 years old. You may need to be screened more often if early forms of precancerous polyps or small growths are found. Your health care provider may recommend screening at an earlier age if you have risk factors for colon cancer.  Your health care provider may recommend using home test kits to check for hidden blood in the stool.  A small camera at the end of a tube can be used to examine your colon (sigmoidoscopy or colonoscopy). This checks for the earliest forms of colorectal cancer.  Prostate and Testicular Cancer  Depending on your age and overall  health, your health care provider may do certain tests to screen for prostate and testicular cancer.  Talk to your health care provider about any symptoms or concerns you have about testicular or prostate cancer.  Skin Cancer  Check your skin from head to toe regularly.  Tell  your health care provider about any new moles or changes in moles, especially if: ? There is a change in a mole's size, shape, or color. ? You have a mole that is larger than a pencil eraser.  Always use sunscreen. Apply sunscreen liberally and repeat throughout the day.  Protect yourself by wearing long sleeves, pants, a wide-brimmed hat, and sunglasses when outside.  What should I know about heart disease, diabetes, and high blood pressure?  If you are 36-27 years of age, have your blood pressure checked every 3-5 years. If you are 75 years of age or older, have your blood pressure checked every year. You should have your blood pressure measured twice-once when you are at a hospital or clinic, and once when you are not at a hospital or clinic. Record the average of the two measurements. To check your blood pressure when you are not at a hospital or clinic, you can use: ? An automated blood pressure machine at a pharmacy. ? A home blood pressure monitor.  Talk to your health care provider about your target blood pressure.  If you are between 20-65 years old, ask your health care provider if you should take aspirin to prevent heart disease.  Have regular diabetes screenings by checking your fasting blood sugar level. ? If you are at a normal weight and have a low risk for diabetes, have this test once every three years after the age of 24. ? If you are overweight and have a high risk for diabetes, consider being tested at a younger age or more often.  A one-time screening for abdominal aortic aneurysm (AAA) by ultrasound is recommended for men aged 45-75 years who are current or former smokers. What should I know about preventing infection? Hepatitis B If you have a higher risk for hepatitis B, you should be screened for this virus. Talk with your health care provider to find out if you are at risk for hepatitis B infection. Hepatitis C Blood testing is recommended for:  Everyone born  from [birth year] through 1967.  Anyone with known risk factors for hepatitis C.  Sexually Transmitted Diseases (STDs)  You should be screened each year for STDs including gonorrhea and chlamydia if: ? You are sexually active and are younger than 74 years of age. ? You are older than 74 years of age and your health care provider tells you that you are at risk for this type of infection. ? Your sexual activity has changed since you were last screened and you are at an increased risk for chlamydia or gonorrhea. Ask your health care provider if you are at risk.  Talk with your health care provider about whether you are at high risk of being infected with HIV. Your health care provider may recommend a prescription medicine to help prevent HIV infection.  What else can I do?  Schedule regular health, dental, and eye exams.  Stay current with your vaccines (immunizations).  Do not use any tobacco products, such as cigarettes, chewing tobacco, and e-cigarettes. If you need help quitting, ask your health care provider.  Limit alcohol intake to no more than 2 drinks per day. One drink equals 12 ounces of beer, 5  ounces of wine, or 1 ounces of hard liquor.  Do not use street drugs.  Do not share needles.  Ask your health care provider for help if you need support or information about quitting drugs.  Tell your health care provider if you often feel depressed.  Tell your health care provider if you have ever been abused or do not feel safe at home. This information is not intended to replace advice given to you by your health care provider. Make sure you discuss any questions you have with your health care provider. Document Released: 07/03/2007 Document Revised: 09/03/2015 Document Reviewed: 10/08/2014 Elsevier Interactive Patient Education  [employer].

## 2017-05-27 ENCOUNTER — Other Ambulatory Visit: Payer: Self-pay | Admitting: Family Medicine

## 2017-06-21 ENCOUNTER — Ambulatory Visit: Payer: Medicare Other | Admitting: Endocrinology

## 2017-06-22 ENCOUNTER — Ambulatory Visit: Payer: Medicare Other | Admitting: Endocrinology

## 2017-06-22 ENCOUNTER — Encounter: Payer: Self-pay | Admitting: Endocrinology

## 2017-06-22 VITALS — BP 164/88 | HR 66 | Wt 234.2 lb

## 2017-06-22 DIAGNOSIS — E1121 Type 2 diabetes mellitus with diabetic nephropathy: Secondary | ICD-10-CM

## 2017-06-22 DIAGNOSIS — E162 Hypoglycemia, unspecified: Secondary | ICD-10-CM

## 2017-06-22 DIAGNOSIS — I10 Essential (primary) hypertension: Secondary | ICD-10-CM

## 2017-06-22 LAB — POCT GLYCOSYLATED HEMOGLOBIN (HGB A1C): HEMOGLOBIN A1C: 7.3 % — AB (ref 4.0–5.6)

## 2017-06-22 NOTE — Progress Notes (Signed)
Subjective:    Patient ID: Paul [name], male    DOB: 1945/04/27, 72 y.o.   MRN: [id number]  HPI Pt returns for f/u of diabetes mellitus: DM type: Insulin-requiring type 2 Dx'ed: 1660 Complications: renal insufficiency and BDR.   Therapy: insulin since 2009 DKA: never.  Severe hypoglycemia: never.   Pancreatitis: never.   Other: he takes qd insulin, after poor results with taking it more frequently; he declines weight loss surgery.   Interval history:  He says he never misses the insulin.  no cbg record, but states cbg's are well-controlled.   It is lowest with exercise, on days he could not anticipate it, so he took the full amount.   Past Medical History:  Diagnosis Date  . Colon polyps 12/09/2009   Multiple in descending colon  . Depression   . Diabetes mellitus    type 2  . Diverticulosis 12/09/2009   Moderate  . Hyperlipidemia   . Hypertension   . melanoma dx'd 12/2012   rt arm; surg   . Microalbuminuria     Past Surgical History:  Procedure Laterality Date  . MASS EXCISION  09/29/2011   [facility]-Excision sebaceous cyst on neck  . MELANOMA EXCISION  12/2012  . TONSILLECTOMY AND ADENOIDECTOMY      Social History   Socioeconomic History  . Marital status: Married    Spouse name: Not on file  . Number of children: 4  . Years of education: Not on file  . Highest education level: Not on file  Occupational History  . Occupation: Retired    Fish farm manager: [place]  . Financial resource strain: Not on file  . Food insecurity:    Worry: Not on file    Inability: Not on file  . Transportation needs:    Medical: Not on file    Non-medical: Not on file  Tobacco Use  . Smoking status: Former Smoker    Packs/day: 1.50    Years: 45.00    Pack years: 67.50    Types: Cigars, Cigarettes    Last attempt to quit: 09/13/1998    Years since quitting: 18.7  . Smokeless tobacco: Never Used  . Tobacco comment: had CT of abd and pelvis   Substance and  Sexual Activity  . Alcohol use: Yes    Alcohol/week: 0.0 oz    Comment: very little/occ  . Drug use: No  . Sexual activity: Never  Lifestyle  . Physical activity:    Days per week: Not on file    Minutes per session: Not on file  . Stress: Not on file  Relationships  . Social connections:    Talks on phone: Not on file    Gets together: Not on file    Attends religious service: Not on file    Active member of club or organization: Not on file    Attends meetings of clubs or organizations: Not on file    Relationship status: Not on file  . Intimate partner violence:    Fear of current or ex partner: Not on file    Emotionally abused: Not on file    Physically abused: Not on file    Forced sexual activity: Not on file  Other Topics Concern  . Not on file  Social History Narrative   Married 4 kids. 6 grandkids.       Retired from [employer]: travel, reading    Current Outpatient Medications on File Prior  to Visit  Medication Sig Dispense Refill  . ACCU-CHEK FASTCLIX LANCETS MISC Use to check blood sugar 2 times per day 200 each 2  . atorvastatin (LIPITOR) 20 MG tablet TAKE 1 TABLET BY MOUTH  DAILY AT 6PM. 90 tablet 1  . Blood Glucose Monitoring Suppl (ACCU-CHEK GUIDE) w/Device KIT 1 each by Does not apply route 2 (two) times daily. 1 kit 0  . bumetanide (BUMEX) 2 MG tablet Take 1 tablet by mouth  daily (Patient taking differently: Take 2 tablets in the morning and 1 in the afternoon) 90 tablet 3  . Cholecalciferol (VITAMIN D-3) 1000 UNITS CAPS Take 2,000 Units by mouth daily.    Marland Kitchen diltiazem (CARDIZEM CD) 360 MG 24 hr capsule TAKE 1 CAPSULE BY MOUTH  DAILY WITH BREAKFAST 90 capsule 1  . hydrALAZINE (APRESOLINE) 100 MG tablet Take 100 mg by mouth 3 (three) times daily. Taking 2 tablets my mouth in the morning and two in the evening.    . Insulin Glargine (LANTUS SOLOSTAR) 100 UNIT/ML Solostar Pen Inject 130 units in the skin subQ every morning 40 pen 3  . Insulin Pen  Needle 31G X 8 MM MISC Inject insulin two times a day. 200 each 2  . labetalol (NORMODYNE) 200 MG tablet TAKE 1 TABLET BY MOUTH TWO  TIMES DAILY (Patient taking differently: TAKE 1 and a half tablets by mouth in the morining and evening. Take 1 tablet by mouth in the afternoon.) 180 tablet 1  . Lancet Devices (AUTOLET IMPRESSION) MISC You to check blood sugar 2 times a day 2 each 1  . Multiple Vitamins-Minerals (CENTURY SENIOR) TABS Take 1 tablet by mouth daily.    . Omega-3 Fatty Acids (FISH OIL) 1200 MG CAPS Take 1,200 mg by mouth daily.    Marland Kitchen PARoxetine (PAXIL) 20 MG tablet TAKE 1 TABLET BY MOUTH  DAILY 90 tablet 1  . sildenafil (VIAGRA) 100 MG tablet Take 1 tablet (100 mg total) by mouth daily as needed for erectile dysfunction (Single Pack requested. May only buy a few pills.). 10 tablet 5  . tadalafil (CIALIS) 20 MG tablet Take 0.5-1 tablets (10-20 mg total) by mouth every other day as needed for erectile dysfunction. 5 tablet 11  . telmisartan (MICARDIS) 80 MG tablet Take 1 tablet (80 mg total) by mouth daily. 90 tablet 3   No current facility-administered medications on file prior to visit.     Allergies  Allergen Reactions  . Amlodipine Swelling    Family History  Problem Relation Age of Onset  . Heart attack Mother        44 from chemo  . Breast cancer Mother        dx. <68; +chemo  . Hypertension Father   . Cerebral aneurysm Father   . BRCA 1/2 Maternal Aunt        never tested, but daughter has known mutation in BRCA2  . Kidney failure Maternal Aunt   . Stomach cancer Maternal Uncle        d. 40; smoker and issues with heartburn  . Heart Problems Paternal Grandfather   . BRCA 1/2 Cousin        BRCA2+; maternal 1st cousin; first person tested in family  . Breast cancer Cousin 59    BP (!) 164/88   Pulse 66   Wt 234 lb 3.2 oz (106.2 kg)   SpO2 95%   BMI 38.97 kg/m    Review of Systems Denies LOC    Objective:  Physical Exam VITAL SIGNS:  See vs  page GENERAL: no distress Pulses: foot pulses are intact bilaterally.   MSK: no deformity of the feet or ankles.  CV: 2+ bilat edema of the legs or ankles Skin:  no ulcer on the feet or ankles.  normal color and temp on the feet and ankles Neuro: sensation is intact to touch on the feet and ankles.   Lab Results  Component Value Date   HGBA1C 7.3 (A) 06/22/2017      Assessment & Plan:  HTN: is noted today Insulin-requiring type 2 DM, with renal insuff: this is the best control this pt should aim for, given this regimen, which does match insulin to his changing needs throughout the day Hypoglycemia: he needs to adjust insulin for exercise.   Patient Instructions  Your blood pressure is high today.  Please see Dr Justin Mend, or Dr Yong Channel soon, to have it rechecked check your blood sugar twice a day.  vary the time of day when you check, between before the 3 meals, and at bedtime.  also check if you have symptoms of your blood sugar being too high or too low.  please keep a record of the readings and bring it to your next appointment here.  please call us sooner if your blood sugar goes below 70, or if you have a lot of readings over 200.   On this type of insulin schedule, you should eat meals on a regular schedule.  If a meal is missed or significantly delayed, your blood sugar could go low.    Please continue the same insulin: 130 units each morning.  However, if you are going to be active, take just 80 units that morning. Also, if you cannot anticipate the activity, eat a light snack with it.   Please come back for a follow-up appointment in 4 months.

## 2017-06-22 NOTE — Patient Instructions (Addendum)
Your blood pressure is high today.  Please see Dr Justin Mend, or Dr Yong Channel soon, to have it rechecked check your blood sugar twice a day.  vary the time of day when you check, between before the 3 meals, and at bedtime.  also check if you have symptoms of your blood sugar being too high or too low.  please keep a record of the readings and bring it to your next appointment here.  please call us sooner if your blood sugar goes below 70, or if you have a lot of readings over 200.   On this type of insulin schedule, you should eat meals on a regular schedule.  If a meal is missed or significantly delayed, your blood sugar could go low.    Please continue the same insulin: 130 units each morning.  However, if you are going to be active, take just 80 units that morning. Also, if you cannot anticipate the activity, eat a light snack with it.   Please come back for a follow-up appointment in 4 months.

## 2017-08-23 LAB — HM DIABETES EYE EXAM

## 2017-09-01 ENCOUNTER — Encounter: Payer: Self-pay | Admitting: Family Medicine

## 2017-09-05 ENCOUNTER — Encounter: Payer: Self-pay | Admitting: Family Medicine

## 2017-09-05 ENCOUNTER — Ambulatory Visit (INDEPENDENT_AMBULATORY_CARE_PROVIDER_SITE_OTHER): Payer: Medicare Other | Admitting: Family Medicine

## 2017-09-05 VITALS — BP 132/74 | HR 53 | Temp 98.5°F | Ht 64.0 in | Wt 233.4 lb

## 2017-09-05 DIAGNOSIS — N183 Chronic kidney disease, stage 3 unspecified: Secondary | ICD-10-CM

## 2017-09-05 DIAGNOSIS — I350 Nonrheumatic aortic (valve) stenosis: Secondary | ICD-10-CM | POA: Diagnosis not present

## 2017-09-05 DIAGNOSIS — E785 Hyperlipidemia, unspecified: Secondary | ICD-10-CM | POA: Diagnosis not present

## 2017-09-05 DIAGNOSIS — R351 Nocturia: Secondary | ICD-10-CM | POA: Diagnosis not present

## 2017-09-05 DIAGNOSIS — F325 Major depressive disorder, single episode, in full remission: Secondary | ICD-10-CM

## 2017-09-05 DIAGNOSIS — Z794 Long term (current) use of insulin: Secondary | ICD-10-CM

## 2017-09-05 DIAGNOSIS — Z0000 Encounter for general adult medical examination without abnormal findings: Secondary | ICD-10-CM | POA: Diagnosis not present

## 2017-09-05 DIAGNOSIS — I5032 Chronic diastolic (congestive) heart failure: Secondary | ICD-10-CM

## 2017-09-05 DIAGNOSIS — M10471 Other secondary gout, right ankle and foot: Secondary | ICD-10-CM

## 2017-09-05 DIAGNOSIS — E1122 Type 2 diabetes mellitus with diabetic chronic kidney disease: Secondary | ICD-10-CM

## 2017-09-05 DIAGNOSIS — I10 Essential (primary) hypertension: Secondary | ICD-10-CM

## 2017-09-05 LAB — COMPREHENSIVE METABOLIC PANEL
ALBUMIN: 3.8 g/dL (ref 3.5–5.2)
ALT: 62 U/L — AB (ref 0–53)
AST: 39 U/L — AB (ref 0–37)
Alkaline Phosphatase: 93 U/L (ref 39–117)
BUN: 28 mg/dL — AB (ref 6–23)
CHLORIDE: 101 meq/L (ref 96–112)
CO2: 32 mEq/L (ref 19–32)
CREATININE: 1.89 mg/dL — AB (ref 0.40–1.50)
Calcium: 9.6 mg/dL (ref 8.4–10.5)
GFR: 37.23 mL/min — ABNORMAL LOW (ref >60.00)
Glucose, Bld: 303 mg/dL — ABNORMAL HIGH (ref 70–99)
Potassium: 4.9 mEq/L (ref 3.5–5.1)
SODIUM: 140 meq/L (ref 135–145)
Total Bilirubin: 0.6 mg/dL (ref 0.2–1.2)
Total Protein: 6.4 g/dL (ref 6.0–8.3)

## 2017-09-05 LAB — LIPID PANEL
CHOL/HDL RATIO: 4
Cholesterol: 148 mg/dL (ref 0–200)
HDL: 38 mg/dL — ABNORMAL LOW (ref >39.00)
LDL CALC: 71 mg/dL (ref 0–99)
NonHDL: 110.41
Triglycerides: 198 mg/dL — ABNORMAL HIGH (ref 0.0–149.0)
VLDL: 39.6 mg/dL (ref 0.0–40.0)

## 2017-09-05 LAB — POC URINALSYSI DIPSTICK (AUTOMATED)
Bilirubin, UA: NEGATIVE
Blood, UA: NEGATIVE
Glucose, UA: POSITIVE — AB
KETONES UA: NEGATIVE
Leukocytes, UA: NEGATIVE
Nitrite, UA: NEGATIVE
PH UA: 6 (ref 5.0–8.0)
Protein, UA: POSITIVE — AB
SPEC GRAV UA: 1.01 (ref 1.010–1.025)
Urobilinogen, UA: 0.2 E.U./dL

## 2017-09-05 LAB — CBC
HCT: 39.3 % (ref 39.0–52.0)
Hemoglobin: 13 g/dL (ref 13.0–17.0)
MCHC: 33 g/dL (ref 30.0–36.0)
MCV: 92.9 fl (ref 78.0–100.0)
Platelets: 190 10*3/uL (ref 150.0–400.0)
RBC: 4.23 Mil/uL (ref 4.22–5.81)
RDW: 14.5 % (ref 11.5–15.5)
WBC: 7.4 10*3/uL (ref 4.0–10.5)

## 2017-09-05 LAB — PSA: PSA: 1.73 ng/mL (ref 0.10–4.00)

## 2017-09-05 MED ORDER — ACETAZOLAMIDE 125 MG PO TABS: 125.0000 mg | ORAL_TABLET | Freq: Two times a day (BID) | ORAL | 0 refills | Status: DC

## 2017-09-05 NOTE — Assessment & Plan Note (Signed)
Gout- no recent flare as long as controls dietary intake- his one flare was was with increased alcohol intake.

## 2017-09-05 NOTE — Assessment & Plan Note (Signed)
Mild aortic stenosis- off echo a few years ago. Last year they said it was moderate and advised 1 year follow up- will update - order today

## 2017-09-05 NOTE — Progress Notes (Signed)
Phone: [phone]  Subjective:  Patient presents today for their annual physical. Chief complaint-noted.   See problem oriented charting- ROS- full  review of systems was completed and negative except for: stable edema  The following were reviewed and entered/updated in epic: Past Medical History:  Diagnosis Date  . Colon polyps 12/09/2009   Multiple in descending colon  . Depression   . Diabetes mellitus    type 2  . Diverticulosis 12/09/2009   Moderate  . Hyperlipidemia   . Hypertension   . melanoma dx'd 12/2012   rt arm; surg   . Microalbuminuria    Patient Active Problem List   Diagnosis Date Noted  . 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 Chronic diastolic heart failure ([place]) 11/26/2013    Priority: High  . History of melanoma 12/18/2012    Priority: High  . Type 2 diabetes, controlled, with renal manifestation ([place]) 07/28/2006    Priority: High  . Essential hypertension 07/28/2006    Priority: High  . CKD (chronic kidney disease), stage III ([place]) 09/19/2014    Priority: Medium  . Obstructive sleep apnea 03/27/2014    Priority: Medium  . Former smoker 01/02/2014    Priority: Medium  . Moderate aortic stenosis 11/26/2013    Priority: Medium  . Hyperlipidemia 07/28/2006    Priority: Medium  . Major depression in full remission ([place]) 07/28/2006    Priority: Medium  . BRCA2 positive 02/06/2015    Priority: Low  . Genetic testing 02/06/2015    Priority: Low  . Diverticulosis of colon with hemorrhage 08/10/2013    Priority: Low  . Hx of adenomatous colonic polyps 08/10/2013    Priority: Low  . Erectile dysfunction 09/04/2009    Priority: Low  . Morbid obesity ([place]) 12/28/2007    Priority: Low  . Gout 06/28/2007    Priority: Low  . Anemia 12/27/2006    Priority: Low  . MICROALBUMINURIA 07/28/2006    Priority: Low   Past Surgical History:  Procedure Laterality Date  . MASS EXCISION  09/29/2011   [facility]-Excision sebaceous cyst on neck    . MELANOMA EXCISION  12/2012  . TONSILLECTOMY AND ADENOIDECTOMY      Family History  Problem Relation Age of Onset  . Heart attack Mother        31 from chemo  . Breast cancer Mother        dx. <68; +chemo  . Hypertension Father   . Cerebral aneurysm Father   . BRCA 1/2 Maternal Aunt        never tested, but daughter has known mutation in BRCA2  . Kidney failure Maternal Aunt   . Stomach cancer Maternal Uncle        d. 43; smoker and issues with heartburn  . Heart Problems Paternal Grandfather   . BRCA 1/2 Cousin        BRCA2+; maternal 1st cousin; first person tested in family  . Breast cancer Cousin 40    Medications- reviewed and updated Current Outpatient Medications  Medication Sig Dispense Refill  . ACCU-CHEK FASTCLIX LANCETS MISC Use to check blood sugar 2 times per day 200 each 2  . atorvastatin (LIPITOR) 20 MG tablet TAKE 1 TABLET BY MOUTH  DAILY AT 6PM. 90 tablet 1  . Blood Glucose Monitoring Suppl (ACCU-CHEK GUIDE) w/Device KIT 1 each by Does not apply route 2 (two) times daily. 1 kit 0  . bumetanide (BUMEX) 2 MG tablet Take 1 tablet by mouth  daily (Patient taking differently: Take 2 tablets in the morning and 1 in the afternoon) 90 tablet 3  . Cholecalciferol (VITAMIN D-3) 1000 UNITS CAPS Take 2,000 Units by mouth daily.    Marland Kitchen diltiazem (CARDIZEM CD) 360 MG 24 hr capsule TAKE 1 CAPSULE BY MOUTH  DAILY WITH BREAKFAST 90 capsule 1  . hydrALAZINE (APRESOLINE) 100 MG tablet Take 100 mg by mouth 3 (three) times daily. Taking 1 tablet my mouth in the morning and two in the evening.    . Insulin Glargine (LANTUS SOLOSTAR) 100 UNIT/ML Solostar Pen Inject 130 units in the skin subQ every morning 40 pen 3  . Insulin Pen Needle 31G X 8 MM MISC Inject insulin two times a day. 200 each 2  . labetalol (NORMODYNE) 200 MG tablet TAKE 1 TABLET BY MOUTH TWO  TIMES DAILY (Patient taking differently: TAKE 1 and a half tablets by mouth in the morining and evening. Take 1 tablet by mouth  in the afternoon.) 180 tablet 1  . Lancet Devices (AUTOLET IMPRESSION) MISC You to check blood sugar 2 times a day 2 each 1  . Multiple Vitamins-Minerals (CENTURY SENIOR) TABS Take 1 tablet by mouth daily.    . Omega-3 Fatty Acids (FISH OIL) 1200 MG CAPS Take 1,200 mg by mouth daily.    Marland Kitchen PARoxetine (PAXIL) 20 MG tablet TAKE 1 TABLET BY MOUTH  DAILY 90 tablet 1  . sildenafil (VIAGRA) 100 MG tablet Take 1 tablet (100 mg total) by mouth daily as needed for erectile dysfunction (Single Pack requested. May only buy a few pills.). 10 tablet 5  . tadalafil (CIALIS) 20 MG tablet Take 0.5-1 tablets (10-20 mg total) by mouth every other day as needed for erectile dysfunction. 5 tablet 11  . telmisartan (MICARDIS) 80 MG tablet Take 1 tablet (80 mg total) by mouth daily. 90 tablet 3  . acetaZOLAMIDE (DIAMOX) 125 MG tablet Take 1 tablet (125 mg total) by mouth 2 (two) times daily. Start day before ascent 20 tablet 0   No current facility-administered medications for this visit.     Allergies-reviewed and updated Allergies  Allergen Reactions  . Amlodipine Swelling    Social History   Social History Narrative   Married 4 kids. 6 grandkids.       Retired from [employer]: travel, reading    Objective: BP 132/74 (BP Location: Left Arm, Patient Position: Sitting, Cuff Size: Large)   Pulse (!) 53   Temp 98.5 F (36.9 C) (Oral)   Ht '5\' 4"'$  (1.626 m)   Wt 233 lb 6.1 oz (105.9 kg)   SpO2 96%   BMI 40.06 kg/m  Gen: NAD, resting comfortably HEENT: Mucous membranes are moist. Oropharynx normal Neck: no thyromegaly CV: RRR no murmurs rubs or gallops Lungs: CTAB no crackles, wheeze, rhonchi Abdomen: soft/nontender/nondistended/normal bowel sounds. No rebound or guarding. Morbidly obese Ext: stable 1+ edema Skin: warm, dry Neuro: grossly normal, moves all extremities, PERRLA  Rectal declined  Assessment/Plan:  72 y.o. male presenting for annual physical.  Health Maintenance  counseling: 1. Anticipatory guidance: Patient counseled regarding regular dental exams -q6 months, eye exams -yearly- does have retinopathy, wearing seatbelts.  2. Risk factor reduction:  Advised patient of need for regular exercise and diet rich and fruits and vegetables to reduce risk of heart attack and stroke. Exercise- has bene down lately with travel, he is hoping to get back to his regular exercise. Diet- 231 last year physical, up a few lbs-  discussed reversing trend Wt Readings from Last 3 Encounters:  09/05/17 233 lb 6.1 oz (105.9 kg)  06/22/17 234 lb 3.2 oz (106.2 kg)  05/26/17 233 lb (105.7 kg)  3. Immunizations/screenings/ancillary studies-advised fall flu shot and shingrix at pharmacy Immunization History  Administered Date(s) Administered  . Influenza Split 10/18/2011  . Influenza Whole 10/19/2006, 10/18/2009  . Influenza, High Dose Seasonal PF 10/31/2012, 11/05/2016  . Influenza,inj,Quad PF,6+ Mos 10/22/2013, 09/19/2014  . Pneumococcal Conjugate-13 10/22/2013  . Pneumococcal Polysaccharide-23 09/11/2008  . Tdap 10/07/2010  . Zoster 10/15/2010   4. Prostate cancer screening-  urinates 2-3 down from 4x a night- hadnt had PSA until 2018 and we agreed to screen through 73. Obviously some risks to screening   Lab Results  Component Value Date   PSA 1.73 09/05/2017   PSA 1.43 09/03/2016   5. Colon cancer screening - 09/26/2013 with 5 year repeat.  6. Skin cancer screening- sees dermatology regularly due to history melanoma. advised regular sunscreen use. Denies worrisome, changing, or new skin lesions.  7. Former smoker- quit in 2000. AAA screening 07/2013 through CT- no AAA.   Status of chronic or acute concerns   Altitude sickness prevention -6500 to 9500 feet ascent planned on a bus tour. We will use acetazolamide though should watch cbgs closely  Morbid obesity- discussed weight loss importance. BMI unfortunately now over 40. Even over 35 morbidly obese due to HTN, HLD,  DM  Type 2 diabetes, controlled, with renal manifestation ([place]) DM- follows with Dr. Loanne Drilling- next visit in October. On high dose insulin. Also has retinopathy- following with Optho Lab Results  Component Value Date   HGBA1C 7.3 (A) 06/22/2017  On acetazolamide will ask him to watch his sugars closely  Chronic diastolic heart failure CHF (does not see cardiology right now, just [place] [facility]) - on bumex '4mg'$  in AM and 2 mg in PM ([place] [facility] ok). Stable edema. Weight largely stable - up a hair.   Essential hypertension HTN- at goal on bumex, diltiazem '360mg'$  XR, clonidine 0.2 mg TID, hydralazine TID, labetalol '200mg'$  BID, telmisartan '80mg'$ . Had been on spironolactone but caused gynecomastia.   Moderate aortic stenosis Mild aortic stenosis- off echo a few years ago. Last year they said it was moderate and advised 1 year follow up- will update - order today  Major depression in full remission ([place]) Depression- PHQ2 of 0, phq9 of 2 within 6 months. Remains on paxil.   CKD (chronic kidney disease), stage III CKD III- knows to avoid nsaids (did take once with gout flare)- will udpate bmet. Following with Dr. Justin Mend.   Gout Gout- no recent flare as long as controls dietary intake- his one flare was was with increased alcohol intake.   Hyperlipidemia Hyperlipidemia- update lipids on atorvastatin '20mg'$   Future Appointments  Date Time Provider [place]  10/21/2017  9:30 AM Renato Shin, MD [facility] None  01/05/2018 10:00 AM Marin Olp, MD [facility]   Return in about 4 months (around 01/05/2018) for follow up- or sooner if needed.  Lab/Order associations: Preventative health care - Plan: CBC, Comprehensive metabolic panel, Lipid panel, PSA, POCT Urinalysis Dipstick (Automated), ECHOCARDIOGRAM COMPLETE, POCT Urinalysis Dipstick (Automated)  Moderate aortic stenosis - Plan: ECHOCARDIOGRAM COMPLETE  Hyperlipidemia, unspecified hyperlipidemia type - Plan: CBC,  Comprehensive metabolic panel, Lipid panel  Nocturia - Plan: PSA, POCT Urinalysis Dipstick (Automated), POCT Urinalysis Dipstick (Automated)  Chronic diastolic heart failure (H[facility])  Controlled type 2 diabetes mellitus with stage 3 chronic kidney disease, with long-term  current use of insulin ([facility])  CKD (chronic kidney disease), stage III ([place])  Essential hypertension  Major depressive disorder with single episode, in full remission ([place])  Acute gout due to other secondary cause involving toe of right foot  Morbid obesity ([place])  Meds ordered this encounter  Medications  . acetaZOLAMIDE (DIAMOX) 125 MG tablet    Sig: Take 1 tablet (125 mg total) by mouth 2 (two) times daily. Start day before ascent    Dispense:  20 tablet    Refill:  0   Return precautions advised.  Garret Reddish, MD

## 2017-09-05 NOTE — Assessment & Plan Note (Signed)
DM- follows with Dr. Loanne Drilling- next visit in October. On high dose insulin. Also has retinopathy- following with Optho Lab Results  Component Value Date   HGBA1C 7.3 (A) 06/22/2017  On acetazolamide will ask him to watch his sugars closely

## 2017-09-05 NOTE — Assessment & Plan Note (Signed)
Hyperlipidemia- update lipids on atorvastatin 20mg 

## 2017-09-05 NOTE — Assessment & Plan Note (Signed)
HTN- at goal on bumex, diltiazem 360mg  XR, clonidine 0.2 mg TID, hydralazine TID, labetalol 200mg  BID, telmisartan 80mg . Had been on spironolactone but caused gynecomastia.

## 2017-09-05 NOTE — Assessment & Plan Note (Signed)
CKD III- knows to avoid nsaids (did take once with gout flare)- will udpate bmet. Following with Dr. Justin Mend.

## 2017-09-05 NOTE — Patient Instructions (Addendum)
Please stop by lab before you go  Make sure to get your high dose flu shot in the fall. Can call for nurse visit in October here or if you get it elsewhere let us know  No changes in meds today  im going to think some more about the altitude sickness- I or our team may reach out to you about this today or tomorrow  Lets focus on losing at least 5 lbs in next 4 months

## 2017-09-05 NOTE — Assessment & Plan Note (Signed)
Depression- PHQ2 of 0, phq9 of 2 within 6 months. Remains on paxil.

## 2017-09-05 NOTE — Assessment & Plan Note (Signed)
CHF (does not see cardiology right now, just [place] [facility]) - on bumex 4mg  in AM and 2 mg in PM ([place] kidney ok). Stable edema. Weight largely stable - up a hair.

## 2017-09-07 ENCOUNTER — Other Ambulatory Visit: Payer: Self-pay | Admitting: Emergency Medicine

## 2017-09-07 ENCOUNTER — Telehealth: Payer: Self-pay | Admitting: *Deleted

## 2017-09-07 MED ORDER — INSULIN PEN NEEDLE 31G X 8 MM MISC: 2 refills | Status: AC

## 2017-09-07 NOTE — Telephone Encounter (Signed)
Left message on voicemail to call office.  

## 2017-09-07 NOTE — Telephone Encounter (Signed)
-----   Message from Marin Olp, MD sent at 09/05/2017  7:58 PM EDT ----- Please also let him know it can affect his blood sugar control so watch his diabetes carefully

## 2017-09-07 NOTE — Telephone Encounter (Signed)
-----   Message from Marin Olp, MD sent at 09/05/2017  7:58 PM EDT ----- Please let patient know that I sent in a medication acetazolamide for altitude sickness for him

## 2017-09-13 NOTE — Telephone Encounter (Signed)
Left message on voicemail to call office.  

## 2017-09-14 NOTE — Telephone Encounter (Signed)
Pt called back returned from trip, told him was calling to let him know Dr. Yong Channel sent in Rx for altitude sickness and wanted to tell you to watch your diabetes cause it can affect your blood sugars. Pt verbalized understanding and said he never picked up Rx due to left next day after visit early morning. Told pt okay sorry never got Rx. Pt said that's okay.

## 2017-09-22 ENCOUNTER — Ambulatory Visit (HOSPITAL_COMMUNITY): Payer: Medicare Other | Attending: Family Medicine

## 2017-09-22 ENCOUNTER — Other Ambulatory Visit: Payer: Self-pay

## 2017-09-22 DIAGNOSIS — I1310 Hypertensive heart and chronic kidney disease without heart failure, with stage 1 through stage 4 chronic kidney disease, or unspecified chronic kidney disease: Secondary | ICD-10-CM | POA: Insufficient documentation

## 2017-09-22 DIAGNOSIS — E1122 Type 2 diabetes mellitus with diabetic chronic kidney disease: Secondary | ICD-10-CM | POA: Diagnosis not present

## 2017-09-22 DIAGNOSIS — I350 Nonrheumatic aortic (valve) stenosis: Secondary | ICD-10-CM | POA: Insufficient documentation

## 2017-09-22 DIAGNOSIS — N189 Chronic kidney disease, unspecified: Secondary | ICD-10-CM | POA: Insufficient documentation

## 2017-09-22 DIAGNOSIS — Z0000 Encounter for general adult medical examination without abnormal findings: Secondary | ICD-10-CM | POA: Diagnosis not present

## 2017-09-22 DIAGNOSIS — E785 Hyperlipidemia, unspecified: Secondary | ICD-10-CM | POA: Insufficient documentation

## 2017-09-26 ENCOUNTER — Telehealth: Payer: Self-pay | Admitting: Family Medicine

## 2017-09-26 ENCOUNTER — Other Ambulatory Visit: Payer: Self-pay

## 2017-09-26 MED ORDER — TELMISARTAN 80 MG PO TABS: 80.0000 mg | ORAL_TABLET | Freq: Every day | ORAL | 3 refills | Status: DC

## 2017-09-26 NOTE — Telephone Encounter (Signed)
See note

## 2017-09-26 NOTE — Telephone Encounter (Signed)
Copied from [place] [phone]. Topic: Quick Communication - Rx Refill/Question >> Sep 26, 2017 10:23 AM Margot Ables wrote: Medication: telmisartan (MICARDIS) 80 MG tablet - [facility] is out of stock and will be faxing request to Dr. Yong Channel for advice/change in therapy - pt is now out of medication and will need something sent to local pharmacy - He said maybe local pharmacy has the telmisartan but he doesn't know - please call pt to advise if medication will be changed & sent to local pharmacy  Has the patient contacted their pharmacy? Yes - [facility] did not notify they were out of stock until today  Preferred Pharmacy (with phone number or street name): [place], [place] - [address] [phone] (Phone) [phone] (Fax)

## 2017-09-26 NOTE — Telephone Encounter (Signed)
Pt says that he use to take Irbesartan 300 MG. Until his pharmacy was out of stock. Pt would like to know if he is able to go back to taking Irbesartan since pharmacy is out of stock for Telmisartan?    Please advise.

## 2017-09-26 NOTE — Telephone Encounter (Signed)
Called [facility] and they have medication in stock. I sent prescription to [facility]

## 2017-10-21 ENCOUNTER — Encounter: Payer: Self-pay | Admitting: Endocrinology

## 2017-10-21 ENCOUNTER — Ambulatory Visit: Payer: Medicare Other | Admitting: Endocrinology

## 2017-10-21 VITALS — BP 132/70 | HR 64 | Ht 64.0 in | Wt 231.4 lb

## 2017-10-21 DIAGNOSIS — E1121 Type 2 diabetes mellitus with diabetic nephropathy: Secondary | ICD-10-CM

## 2017-10-21 LAB — POCT GLYCOSYLATED HEMOGLOBIN (HGB A1C): HEMOGLOBIN A1C: 7.1 % — AB (ref 4.0–5.6)

## 2017-10-21 MED ORDER — INSULIN GLARGINE 100 UNIT/ML SOLOSTAR PEN: 120.0000 [IU] | PEN_INJECTOR | SUBCUTANEOUS | 3 refills | Status: DC

## 2017-10-21 NOTE — Progress Notes (Signed)
Subjective:    Patient ID: Paul [name], male    DOB: 1945/09/02, 72 y.o.   MRN: [id number]  HPI Pt returns for f/u of diabetes mellitus: DM type: Insulin-requiring type 2 Dx'ed: 4970 Complications: renal insufficiency and BDR.   Therapy: insulin since 2009 DKA: never.  Severe hypoglycemia: never.   Pancreatitis: never.   Other: he takes qd insulin, after poor results with taking it more frequently; he declines weight loss surgery.   Interval history:  He says he never misses the insulin.  Meter is downloaded today, and the printout is scanned into the record.  cbg varies from 57-213.  There is no trend throughout the day. Past Medical History:  Diagnosis Date  . Colon polyps 12/09/2009   Multiple in descending colon  . Depression   . Diabetes mellitus    type 2  . Diverticulosis 12/09/2009   Moderate  . Hyperlipidemia   . Hypertension   . melanoma dx'd 12/2012   rt arm; surg   . Microalbuminuria     Past Surgical History:  Procedure Laterality Date  . MASS EXCISION  09/29/2011   [facility]-Excision sebaceous cyst on neck  . MELANOMA EXCISION  12/2012  . TONSILLECTOMY AND ADENOIDECTOMY      Social History   Socioeconomic History  . Marital status: Married    Spouse name: Not on file  . Number of children: 4  . Years of education: Not on file  . Highest education level: Not on file  Occupational History  . Occupation: Retired    Fish farm manager: [employer]  . Financial resource strain: Not on file  . Food insecurity:    Worry: Not on file    Inability: Not on file  . Transportation needs:    Medical: Not on file    Non-medical: Not on file  Tobacco Use  . Smoking status: Former Smoker    Packs/day: 1.50    Years: 45.00    Pack years: 67.50    Types: Cigars, Cigarettes    Last attempt to quit: 09/13/1998    Years since quitting: 19.1  . Smokeless tobacco: Never Used  . Tobacco comment: had CT of abd and pelvis   Substance and Sexual  Activity  . Alcohol use: Yes    Alcohol/week: 0.0 standard drinks    Comment: very little/occ  . Drug use: No  . Sexual activity: Never  Lifestyle  . Physical activity:    Days per week: Not on file    Minutes per session: Not on file  . Stress: Not on file  Relationships  . Social connections:    Talks on phone: Not on file    Gets together: Not on file    Attends religious service: Not on file    Active member of club or organization: Not on file    Attends meetings of clubs or organizations: Not on file    Relationship status: Not on file  . Intimate partner violence:    Fear of current or ex partner: Not on file    Emotionally abused: Not on file    Physically abused: Not on file    Forced sexual activity: Not on file  Other Topics Concern  . Not on file  Social History Narrative   Married 4 kids. 6 grandkids.       Retired from [employer]: travel, reading    Current Outpatient Medications on File Prior to Visit  Medication  Sig Dispense Refill  . ACCU-CHEK FASTCLIX LANCETS MISC Use to check blood sugar 2 times per day 200 each 2  . acetaZOLAMIDE (DIAMOX) 125 MG tablet Take 1 tablet (125 mg total) by mouth 2 (two) times daily. Start day before ascent 20 tablet 0  . atorvastatin (LIPITOR) 20 MG tablet TAKE 1 TABLET BY MOUTH  DAILY AT 6PM. 90 tablet 1  . Blood Glucose Monitoring Suppl (ACCU-CHEK GUIDE) w/Device KIT 1 each by Does not apply route 2 (two) times daily. 1 kit 0  . bumetanide (BUMEX) 2 MG tablet Take 1 tablet by mouth  daily (Patient taking differently: Take 2 tablets in the morning and 1 in the afternoon) 90 tablet 3  . Cholecalciferol (VITAMIN D-3) 1000 UNITS CAPS Take 2,000 Units by mouth daily.    Marland Kitchen diltiazem (CARDIZEM CD) 360 MG 24 hr capsule TAKE 1 CAPSULE BY MOUTH  DAILY WITH BREAKFAST 90 capsule 1  . hydrALAZINE (APRESOLINE) 100 MG tablet Take 150 mg by mouth 2 (two) times daily. Taking 1 tablet my mouth in the morning and two in the evening.     . Insulin Pen Needle 31G X 8 MM MISC Inject insulin two times a day. 200 each 2  . labetalol (NORMODYNE) 200 MG tablet TAKE 1 TABLET BY MOUTH TWO  TIMES DAILY (Patient taking differently: TAKE 1 and a half tablets by mouth in the morining and evening. Take 1 tablet by mouth in the afternoon.) 180 tablet 1  . Lancet Devices (AUTOLET IMPRESSION) MISC You to check blood sugar 2 times a day 2 each 1  . Multiple Vitamins-Minerals (CENTURY SENIOR) TABS Take 1 tablet by mouth daily.    . Omega-3 Fatty Acids (FISH OIL) 1200 MG CAPS Take 1,200 mg by mouth daily.    Marland Kitchen PARoxetine (PAXIL) 20 MG tablet TAKE 1 TABLET BY MOUTH  DAILY 90 tablet 1  . sildenafil (VIAGRA) 100 MG tablet Take 1 tablet (100 mg total) by mouth daily as needed for erectile dysfunction (Single Pack requested. May only buy a few pills.). 10 tablet 5  . tadalafil (CIALIS) 20 MG tablet Take 0.5-1 tablets (10-20 mg total) by mouth every other day as needed for erectile dysfunction. 5 tablet 11  . telmisartan (MICARDIS) 80 MG tablet Take 1 tablet (80 mg total) by mouth daily. 90 tablet 3   No current facility-administered medications on file prior to visit.     Allergies  Allergen Reactions  . Amlodipine Swelling    Family History  Problem Relation Age of Onset  . Heart attack Mother        66 from chemo  . Breast cancer Mother        dx. <68; +chemo  . Hypertension Father   . Cerebral aneurysm Father   . BRCA 1/2 Maternal Aunt        never tested, but daughter has known mutation in BRCA2  . Kidney failure Maternal Aunt   . Stomach cancer Maternal Uncle        d. 87; smoker and issues with heartburn  . Heart Problems Paternal Grandfather   . BRCA 1/2 Cousin        BRCA2+; maternal 1st cousin; first person tested in family  . Breast cancer Cousin 59    BP 132/70 (BP Location: Left Arm, Patient Position: Sitting)   Pulse 64   Ht '5\' 4"'$  (1.626 m)   Wt 231 lb 6.4 oz (105 kg)   SpO2 94%   BMI 39.72 kg/m  Review of  Systems Denies LOC    Objective:   Physical Exam VITAL SIGNS:  See vs page GENERAL: no distress Pulses: dorsalis pedis intact bilat.   MSK: no deformity of the feet CV: 1+ bilat leg edema Skin:  no ulcer on the feet.  normal color and temp on the feet. Neuro: sensation is intact to touch on the feet.    Lab Results  Component Value Date   HGBA1C 7.1 (A) 10/21/2017      Assessment & Plan:  Insulin-requiring type 2 DM, with DR: this is the best control this pt should aim for, given this regimen, which does match insulin to his changing needs throughout the day renal insuff: he may need a faster-acting qd insulin, but we'll try reducing lantus first Hypoglycemia: we'll re-eval after lantus is reduced  Patient Instructions  check your blood sugar twice a day.  vary the time of day when you check, between before the 3 meals, and at bedtime.  also check if you have symptoms of your blood sugar being too high or too low.  please keep a record of the readings and bring it to your next appointment here.  please call us sooner if your blood sugar goes below 70, or if you have a lot of readings over 200.  On this type of insulin schedule, you should eat meals on a regular schedule.  If a meal is missed or significantly delayed, your blood sugar could go low.    Please reduce the insulin to 120 units each morning.  However, if you are going to be active, take just 80 units that morning. Also, if you cannot anticipate the activity, eat a light snack with it.   Please come back for a follow-up appointment in 4 months.

## 2017-10-21 NOTE — Patient Instructions (Addendum)
check your blood sugar twice a day.  vary the time of day when you check, between before the 3 meals, and at bedtime.  also check if you have symptoms of your blood sugar being too high or too low.  please keep a record of the readings and bring it to your next appointment here.  please call us sooner if your blood sugar goes below 70, or if you have a lot of readings over 200.  On this type of insulin schedule, you should eat meals on a regular schedule.  If a meal is missed or significantly delayed, your blood sugar could go low.    Please reduce the insulin to 120 units each morning.  However, if you are going to be active, take just 80 units that morning. Also, if you cannot anticipate the activity, eat a light snack with it.   Please come back for a follow-up appointment in 4 months.

## 2017-11-07 ENCOUNTER — Other Ambulatory Visit: Payer: Self-pay | Admitting: Endocrinology

## 2017-11-10 ENCOUNTER — Other Ambulatory Visit: Payer: Self-pay

## 2017-11-10 MED ORDER — ACCU-CHEK GUIDE W/DEVICE KIT: 1.0000 | PACK | Freq: Two times a day (BID) | 0 refills | Status: AC

## 2017-11-14 ENCOUNTER — Other Ambulatory Visit: Payer: Self-pay | Admitting: Endocrinology

## 2017-11-30 ENCOUNTER — Telehealth: Payer: Self-pay | Admitting: Endocrinology

## 2017-11-30 NOTE — Telephone Encounter (Signed)
Patient is calling to get his accu-check smart view strips filled. Please Advise, thanks [facility]

## 2017-12-01 ENCOUNTER — Other Ambulatory Visit: Payer: Self-pay

## 2017-12-01 MED ORDER — ACCU-CHEK FASTCLIX LANCETS MISC: 2 refills | Status: AC

## 2017-12-01 MED ORDER — GLUCOSE BLOOD VI STRP: ORAL_STRIP | 12 refills | Status: AC

## 2017-12-01 NOTE — Telephone Encounter (Signed)
Received notification from pt re: request to refill lancets and test strips. Request authorized and refill sent as requested.

## 2017-12-01 NOTE — Telephone Encounter (Signed)
Sent today

## 2017-12-26 ENCOUNTER — Other Ambulatory Visit: Payer: Self-pay | Admitting: Family Medicine

## 2018-01-05 ENCOUNTER — Encounter: Payer: Self-pay | Admitting: Family Medicine

## 2018-01-05 ENCOUNTER — Ambulatory Visit: Payer: Medicare Other | Admitting: Family Medicine

## 2018-01-05 VITALS — BP 152/86 | HR 61 | Temp 98.3°F | Ht 64.0 in | Wt 230.4 lb

## 2018-01-05 DIAGNOSIS — I5032 Chronic diastolic (congestive) heart failure: Secondary | ICD-10-CM

## 2018-01-05 DIAGNOSIS — I10 Essential (primary) hypertension: Secondary | ICD-10-CM | POA: Diagnosis not present

## 2018-01-05 DIAGNOSIS — I350 Nonrheumatic aortic (valve) stenosis: Secondary | ICD-10-CM

## 2018-01-05 DIAGNOSIS — Z6839 Body mass index (BMI) 39.0-39.9, adult: Secondary | ICD-10-CM

## 2018-01-05 DIAGNOSIS — F325 Major depressive disorder, single episode, in full remission: Secondary | ICD-10-CM

## 2018-01-05 LAB — COMPREHENSIVE METABOLIC PANEL
ALBUMIN: 4 g/dL (ref 3.5–5.2)
ALT: 50 U/L (ref 0–53)
AST: 35 U/L (ref 0–37)
Alkaline Phosphatase: 97 U/L (ref 39–117)
BUN: 31 mg/dL — ABNORMAL HIGH (ref 6–23)
CO2: 29 mEq/L (ref 19–32)
Calcium: 9.7 mg/dL (ref 8.4–10.5)
Chloride: 103 mEq/L (ref 96–112)
Creatinine, Ser: 1.65 mg/dL — ABNORMAL HIGH (ref 0.40–1.50)
GFR: 43.51 mL/min — ABNORMAL LOW (ref >60.00)
Glucose, Bld: 228 mg/dL — ABNORMAL HIGH (ref 70–99)
Potassium: 4.2 mEq/L (ref 3.5–5.1)
Sodium: 139 mEq/L (ref 135–145)
Total Bilirubin: 0.5 mg/dL (ref 0.2–1.2)
Total Protein: 6.7 g/dL (ref 6.0–8.3)

## 2018-01-05 MED ORDER — CLONIDINE HCL 0.1 MG PO TABS: 0.1000 mg | ORAL_TABLET | Freq: Two times a day (BID) | ORAL | 5 refills | Status: DC

## 2018-01-05 NOTE — Patient Instructions (Addendum)
Team- please update PHQ9  Continue current blood pressure medicines. Add clonidine 0.1mg  twice a day back in as long as [place] [facility] is ok with it- give them a call today and tell them I added it with your blood pressure in 150-160 range.   Team please review meds and refill anything else that he needs  1 month blood pressure recheck in office

## 2018-01-05 NOTE — Assessment & Plan Note (Signed)
S: Moderate aortic stenosis on repeat examination in September 2019 A/P: Stable-repeat in 2 years likely

## 2018-01-05 NOTE — Assessment & Plan Note (Signed)
S: Morbid obesity remains with BMI over 35 and hypertension, hyperlipidemia, diabetesHas lost 1 pound since last visit which is good considering we are in the midst of the holiday season A/P: Mild improvement Encouraged need for healthy eating, regular exercise, weight loss.  He got a fit bit and is walking 10k steps a day for at least last few days

## 2018-01-05 NOTE — Assessment & Plan Note (Signed)
S: Patient follows with [facility] and does not see cardiology-he is on Bumex 4 mg in the morning and 2 mg in evening and [facility] has been okay with this regimen.  Weight is stable today.  Edema is stable A/P: Stable-continue current medications.  Patient mentions today possibly wanting to see cardiology-his nephrologist mention this-we will go ahead and refer given high doses of Bumex for diastolic heart failure/HFPeF and for moderate aortic stenosis

## 2018-01-05 NOTE — Assessment & Plan Note (Signed)
S: reports doing well on the paxil.declines depression A/P: stable- update phq9 in epic

## 2018-01-05 NOTE — Assessment & Plan Note (Signed)
S: Poorly controlled on Bumex, diltiazem 360 mg extended release, hydralazine 3 times daily, labetalol 300 in AM and 200 in afternoon, telmisartan 80 mg.  Had to come off spironolactone due to gynecomastia. He states his #s have been in 150s/160s at home- Last year clonidine was stopped by [facility].   He also has chronic kidney disease stage III- he is following with Dr. Justin Mend.  BP Readings from Last 3 Encounters:  01/05/18 (!) 152/86  10/21/17 132/70  09/05/17 132/74  A/P: We discussed blood pressure goal of <140/90. Continue current meds: But add clonidine back in at lower dose 0.1 mg twice daily-I asked him to clear this with [facility] since they previously took him off of this

## 2018-01-05 NOTE — Progress Notes (Signed)
Subjective:  Paul [name] is a 72 y.o. year old very pleasant male patient who presents for/with See problem oriented charting ROS- very mild headaches each morning that go away in day- seems to come on after morning meds. No chest pain or shortness of breath. No blurry vision. Stable edema.   Past Medical History-  Patient Active Problem List   Diagnosis Date Noted  . 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 Chronic diastolic heart failure ([place]) 11/26/2013    Priority: High  . History of melanoma 12/18/2012    Priority: High  . Type 2 diabetes, controlled, with renal manifestation ([place]) 07/28/2006    Priority: High  . Essential hypertension 07/28/2006    Priority: High  . CKD (chronic kidney disease), stage III ([place]) 09/19/2014    Priority: Medium  . Obstructive sleep apnea 03/27/2014    Priority: Medium  . Former smoker 01/02/2014    Priority: Medium  . Moderate aortic stenosis 11/26/2013    Priority: Medium  . Hyperlipidemia 07/28/2006    Priority: Medium  . Major depression in full remission ([place]) 07/28/2006    Priority: Medium  . BRCA2 positive 02/06/2015    Priority: Low  . Genetic testing 02/06/2015    Priority: Low  . Diverticulosis of colon with hemorrhage 08/10/2013    Priority: Low  . Hx of adenomatous colonic polyps 08/10/2013    Priority: Low  . Erectile dysfunction 09/04/2009    Priority: Low  . Morbid obesity ([place]) 12/28/2007    Priority: Low  . Gout 06/28/2007    Priority: Low  . Anemia 12/27/2006    Priority: Low  . 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 ACCU-CHEK FASTCLIX LANCETS MISC Use to check blood sugar 2 times per day 200 each 2  . acetaZOLAMIDE (DIAMOX) 125 MG tablet Take 1 tablet (125 mg total) by mouth 2 (two) times daily. Start day before ascent 20 tablet 0  . atorvastatin (LIPITOR) 20 MG tablet TAKE 1 TABLET  BY MOUTH  DAILY AT 6PM. 90 tablet 1  . Blood Glucose Monitoring Suppl (ACCU-CHEK GUIDE) w/Device KIT 1 each by Does not apply route 2 (two) times daily. 1 kit 0  . bumetanide (BUMEX) 2 MG tablet Take 1 tablet by mouth  daily (Patient taking differently: Take 2 tablets in the morning and 1 in the afternoon) 90 tablet 3  . Cholecalciferol (VITAMIN D-3) 1000 UNITS CAPS Take 2,000 Units by mouth daily.    Marland Kitchen diltiazem (CARDIZEM CD) 360 MG 24 hr capsule TAKE 1 CAPSULE BY MOUTH  DAILY WITH BREAKFAST 90 capsule 1  . glucose blood test strip Use to monitor glucose levels twice per day 100 each 12  . hydrALAZINE (APRESOLINE) 100 MG tablet Take 150 mg by mouth 2 (two) times daily. Taking 1 tablet my mouth in the morning and two in the evening.    . Insulin Pen Needle 31G X 8 MM MISC Inject insulin two times a day. 200 each 2  . labetalol (NORMODYNE) 200 MG tablet TAKE 1 TABLET BY MOUTH TWO  TIMES DAILY (Patient taking differently: TAKE 1 and a half tablets by mouth in the morining and evening. Take 1 tablet by mouth in the afternoon.) 180 tablet 1  . Lancet Devices (AUTOLET IMPRESSION) MISC You to check blood sugar 2 times a day 2 each 1  . LANTUS SOLOSTAR 100 UNIT/ML Solostar  Pen INJECT SUBCUTANEOUSLY 130  UNITS IN THE MORNING 120 mL 3  . Multiple Vitamins-Minerals (CENTURY SENIOR) TABS Take 1 tablet by mouth daily.    . Omega-3 Fatty Acids (FISH OIL) 1200 MG CAPS Take 1,200 mg by mouth daily.    Marland Kitchen PARoxetine (PAXIL) 20 MG tablet TAKE 1 TABLET BY MOUTH  DAILY 90 tablet 1  . sildenafil (VIAGRA) 100 MG tablet Take 1 tablet (100 mg total) by mouth daily as needed for erectile dysfunction (Single Pack requested. May only buy a few pills.). 10 tablet 5  . tadalafil (CIALIS) 20 MG tablet Take 0.5-1 tablets (10-20 mg total) by mouth every other day as needed for erectile dysfunction. 5 tablet 11  . telmisartan (MICARDIS) 80 MG tablet Take 1 tablet (80 mg total) by mouth daily. 90 tablet 3  . cloNIDine (CATAPRES) 0.1  MG tablet Take 1 tablet (0.1 mg total) by mouth 2 (two) times daily. 60 tablet 5   No current facility-administered medications for this visit.     Objective: BP (!) 152/86 (BP Location: Left Arm, Patient Position: Sitting, Cuff Size: Large)   Pulse 61   Temp 98.3 F (36.8 C) (Oral)   Ht '5\' 4"'$  (1.626 m)   Wt 230 lb 6.4 oz (104.5 kg)   SpO2 96%   BMI 39.55 kg/m  Gen: NAD, resting comfortably CV: RRR no murmurs rubs or gallops Lungs: CTAB no crackles, wheeze, rhonchi Abdomen: soft/nontender/nondistended/obese Ext: 1+ edema Skin: warm, dry Neuro: Speech normal, moves all extremities  Assessment/Plan:  Other notes: 1.  Continues to follow with Dr. Loanne Drilling for his diabetes-on high-dose insulin.  He has diabetic retinopathy and follows up with ophthalmology.  These issues appear stable.  Morbid obesity ([place]) S: Morbid obesity remains with BMI over 35 and hypertension, hyperlipidemia, diabetesHas lost 1 pound since last visit which is good considering we are in the midst of the holiday season A/P: Mild improvement Encouraged need for healthy eating, regular exercise, weight loss.  He got a fit bit and is walking 10k steps a day for at least last few days  Moderate aortic stenosis S: Moderate aortic stenosis on repeat examination in September 2019 A/P: Stable-repeat in 2 years likely  Major depression in full remission ([place]) S: reports doing well on the paxil.declines depression A/P: stable- update phq9 in epic  Chronic diastolic heart failure S: Patient follows with [facility] and does not see cardiology-he is on Bumex 4 mg in the morning and 2 mg in evening and [facility] has been okay with this regimen.  Weight is stable today.  Edema is stable A/P: Stable-continue current medications.  Patient mentions today possibly wanting to see cardiology-his nephrologist mention this-we will go ahead and refer given high doses of Bumex for diastolic heart failure/HFPeF and for  moderate aortic stenosis  Essential hypertension S: Poorly controlled on Bumex, diltiazem 360 mg extended release, hydralazine 3 times daily, labetalol 300 in AM and 200 in afternoon, telmisartan 80 mg.  Had to come off spironolactone due to gynecomastia. He states his #s have been in 150s/160s at home- Last year clonidine was stopped by [facility].   He also has chronic kidney disease stage III- he is following with Dr. Justin Mend.  BP Readings from Last 3 Encounters:  01/05/18 (!) 152/86  10/21/17 132/70  09/05/17 132/74  A/P: We discussed blood pressure goal of <140/90. Continue current meds: But add clonidine back in at lower dose 0.1 mg twice daily-I asked him to clear this with  [facility] since they previously took him off of this  If mild headaches persist at follow-up or worsen-consider further evaluation  Future Appointments  Date Time Provider [place]  02/06/2018 11:00 AM Marin Olp, MD [facility]  02/21/2018 10:00 AM Renato Shin, MD [facility] None  1 month blood pressure recheck  Lab/Order associations: BMI 39.0-39.9,adult  Morbid obesity ([place])  Moderate aortic stenosis  Major depressive disorder with single episode, in full remission [facility])  Essential hypertension - Plan: Comprehensive metabolic panel  Chronic diastolic heart failure ([place])  Meds ordered this encounter  Medications  . cloNIDine (CATAPRES) 0.1 MG tablet    Sig: Take 1 tablet (0.1 mg total) by mouth 2 (two) times daily.    Dispense:  60 tablet    Refill:  5   Return precautions advised.  Garret Reddish, MD

## 2018-02-06 ENCOUNTER — Encounter: Payer: Self-pay | Admitting: Family Medicine

## 2018-02-06 ENCOUNTER — Ambulatory Visit: Payer: Medicare Other | Admitting: Family Medicine

## 2018-02-06 VITALS — BP 118/58 | HR 51 | Temp 97.6°F | Ht 64.0 in | Wt 229.4 lb

## 2018-02-06 DIAGNOSIS — I10 Essential (primary) hypertension: Secondary | ICD-10-CM

## 2018-02-06 NOTE — Progress Notes (Signed)
Subjective:  Paul [name] is a 73 y.o. year old very pleasant male patient who presents for/with See problem oriented charting ROS-stable edema.  No chest pain reported.  Mild headaches in the back of his neck.  Nocturia 3 times a night  Past Medical History-  Patient Active Problem List   Diagnosis Date Noted  . 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 Chronic diastolic heart failure ([place]) 11/26/2013    Priority: High  . History of melanoma 12/18/2012    Priority: High  . Type 2 diabetes, controlled, with renal manifestation ([place]) 07/28/2006    Priority: High  . Essential hypertension 07/28/2006    Priority: High  . CKD (chronic kidney disease), stage III ([place]) 09/19/2014    Priority: Medium  . Obstructive sleep apnea 03/27/2014    Priority: Medium  . Former smoker 01/02/2014    Priority: Medium  . Moderate aortic stenosis 11/26/2013    Priority: Medium  . Hyperlipidemia 07/28/2006    Priority: Medium  . Major depression in full remission ([place]) 07/28/2006    Priority: Medium  . BRCA2 positive 02/06/2015    Priority: Low  . Genetic testing 02/06/2015    Priority: Low  . Diverticulosis of colon with hemorrhage 08/10/2013    Priority: Low  . Hx of adenomatous colonic polyps 08/10/2013    Priority: Low  . Erectile dysfunction 09/04/2009    Priority: Low  . Morbid obesity ([place]) 12/28/2007    Priority: Low  . Gout 06/28/2007    Priority: Low  . Anemia 12/27/2006    Priority: Low  . 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 ACCU-CHEK FASTCLIX LANCETS MISC Use to check blood sugar 2 times per day 200 each 2  . acetaZOLAMIDE (DIAMOX) 125 MG tablet Take 1 tablet (125 mg total) by mouth 2 (two) times daily. Start day before ascent 20 tablet 0  . atorvastatin (LIPITOR) 20 MG tablet TAKE 1 TABLET BY MOUTH  DAILY AT 6PM. 90 tablet 1  . Blood Glucose  Monitoring Suppl (ACCU-CHEK GUIDE) w/Device KIT 1 each by Does not apply route 2 (two) times daily. 1 kit 0  . bumetanide (BUMEX) 2 MG tablet Take 1 tablet by mouth  daily (Patient taking differently: Take 2 tablets in the morning and 1 in the afternoon) 90 tablet 3  . Cholecalciferol (VITAMIN D-3) 1000 UNITS CAPS Take 2,000 Units by mouth daily.    . cloNIDine (CATAPRES) 0.1 MG tablet Take 1 tablet (0.1 mg total) by mouth 2 (two) times daily. 60 tablet 5  . diltiazem (CARDIZEM CD) 360 MG 24 hr capsule TAKE 1 CAPSULE BY MOUTH  DAILY WITH BREAKFAST 90 capsule 1  . glucose blood test strip Use to monitor glucose levels twice per day 100 each 12  . hydrALAZINE (APRESOLINE) 100 MG tablet Take 150 mg by mouth 2 (two) times daily. Taking 1 tablet my mouth in the morning and two in the evening.    . Insulin Pen Needle 31G X 8 MM MISC Inject insulin two times a day. 200 each 2  . labetalol (NORMODYNE) 200 MG tablet TAKE 1 TABLET BY MOUTH TWO  TIMES DAILY (Patient taking differently: TAKE 1 and a half tablets by mouth in the morining and evening. Take 1 tablet by mouth in the afternoon.) 180 tablet 1  . Lancet Devices (AUTOLET IMPRESSION) MISC You to check blood sugar 2  times a day 2 each 1  . LANTUS SOLOSTAR 100 UNIT/ML Solostar Pen INJECT SUBCUTANEOUSLY 130  UNITS IN THE MORNING 120 mL 3  . Multiple Vitamins-Minerals (CENTURY SENIOR) TABS Take 1 tablet by mouth daily.    . Omega-3 Fatty Acids (FISH OIL) 1200 MG CAPS Take 1,200 mg by mouth daily.    Marland Kitchen PARoxetine (PAXIL) 20 MG tablet TAKE 1 TABLET BY MOUTH  DAILY 90 tablet 1  . sildenafil (VIAGRA) 100 MG tablet Take 1 tablet (100 mg total) by mouth daily as needed for erectile dysfunction (Single Pack requested. May only buy a few pills.). 10 tablet 5  . tadalafil (CIALIS) 20 MG tablet Take 0.5-1 tablets (10-20 mg total) by mouth every other day as needed for erectile dysfunction. 5 tablet 11  . telmisartan (MICARDIS) 80 MG tablet Take 1 tablet (80 mg total)  by mouth daily. 90 tablet 3   Objective: BP (!) 118/58 (BP Location: Left Arm, Patient Position: Sitting, Cuff Size: Large)   Pulse (!) 51   Temp 97.6 F (36.4 C) (Oral)   Ht _0  (1.626 m)   Wt 229 lb 6.4 oz (104.1 kg)   SpO2 97%   BMI 39.38 kg/m  Gen: NAD, resting comfortably CV: RRR no murmurs rubs or gallops Lungs: CTAB no crackles, wheeze, rhonchi  Ext: no edema Skin: warm, dry  Assessment/Plan:  Other notes: 1.Got a fit bit before last visit- trying to walk 10,000 steps a day  Essential hypertension S: controlled today.  His home numbers have looked better overall and he is now seeing some numbers in the 130s and 140s-particularly in the evening time or if he allows some time after taking his medications in the morning.  He is concerned because his first morning blood pressures have been in the 150s to 170 range before he takes his medication.  AM meds: imdur 67m, dilt 3671m bumex 4, hydralazine 10014mlabetalol 300m64m Afternoon: bumex 2 mg, labetalol 200mg65m: telmisartan 80mg,9mralazine 200mg (47md extra 100mg fr69mfternoon to PM per renal), laetbalol 300mg  Ha75mme mild headaches last visit-admits to higher stress lately after losing friend to cerebral aneurysm  BP Readings from Last 3 Encounters:  02/06/18 (!) 118/58  01/05/18 (!) 152/86  10/21/17 132/70  A/P: Controlled today at  blood pressure goal of <140/90 but still with some poor control at home-typically in the morning. Continue current meds:  Doing much better but he wants to see more consistency with his numbers-particularly in the morning.  We will make some adjustments to see if we can help.  From AVS "  Patient Instructions  Blood pressure looks better but still morning #s are high  Steps 1. Try clonidine at bedtime 2. If not seeing any improvement- can move isosorbide mononitrate 30mg to t44mvening meds to see if that helps the next morning  Plan to recheck in 1 month- if #s look great  please call to cancel this "  We also discussed being more consistent with his exercise-after the holidays he is been trying to get back to his 10,000 steps per day goal on his Fitbit  Future Appointments  Date Time [place] 10:00 AM [place], [facility] Shin[facility] None  03/09/2018  9:40 AM [place], [facility]in [facility]   Return in about 1 month (around 03/09/2018).  Return precautions advised.  Stephen HuGarret Reddish

## 2018-02-06 NOTE — Assessment & Plan Note (Signed)
S: controlled today.  His home numbers have looked better overall and he is now seeing some numbers in the 130s and 140s-particularly in the evening time or if he allows some time after taking his medications in the morning.  He is concerned because his first morning blood pressures have been in the 150s to 170 range before he takes his medication.  AM meds: imdur 30mg , dilt 360mg , bumex 4, hydralazine 100mg , labetalol 300mg ,   Afternoon: bumex 2 mg, labetalol 200mg   PM: telmisartan 80mg , hydralazine 200mg  (moved extra 100mg  from afternoon to PM per renal), laetbalol 300mg   Has some mild headaches last visit-admits to higher stress lately after losing friend to cerebral aneurysm  BP Readings from Last 3 Encounters:  02/06/18 (!) 118/58  01/05/18 (!) 152/86  10/21/17 132/70  A/P: We discussed blood pressure goal of <140/90. Continue current meds:  Doing much better but he wants to see more consistency with his numbers-particularly in the morning.  We will make some adjustments to see if we can help.  From AVS "  Patient Instructions  Blood pressure looks better but still morning #s are high  Steps 1. Try clonidine at bedtime 2. If not seeing any improvement- can move isosorbide mononitrate 30mg  to the evening meds to see if that helps the next morning  Plan to recheck in 1 month- if #s look great please call to cancel this "  We also discussed being more consistent with his exercise-after the holidays he is been trying to get back to his 10,000 steps per day goal on his Fitbit

## 2018-02-06 NOTE — Patient Instructions (Addendum)
Blood pressure looks better but still morning #s are high  Steps 1. Try clonidine at bedtime 2. If not seeing any improvement- can move isosorbide mononitrate 30mg  to the evening meds to see if that helps the next morning  Plan to recheck in 1 month- if #s look great please call to cancel this

## 2018-02-21 ENCOUNTER — Encounter: Payer: Self-pay | Admitting: Endocrinology

## 2018-02-21 ENCOUNTER — Ambulatory Visit: Payer: Medicare Other | Admitting: Endocrinology

## 2018-02-21 VITALS — BP 142/70 | HR 55 | Ht 64.0 in | Wt 228.8 lb

## 2018-02-21 DIAGNOSIS — E1121 Type 2 diabetes mellitus with diabetic nephropathy: Secondary | ICD-10-CM | POA: Diagnosis not present

## 2018-02-21 LAB — POCT GLYCOSYLATED HEMOGLOBIN (HGB A1C): Hemoglobin A1C: 7.1 % — AB (ref 4.0–5.6)

## 2018-02-21 MED ORDER — INSULIN GLARGINE 100 UNIT/ML SOLOSTAR PEN: 100.0000 [IU] | PEN_INJECTOR | SUBCUTANEOUS | 3 refills | Status: DC

## 2018-02-21 NOTE — Progress Notes (Signed)
Subjective:    Patient ID: Paul [name], male    DOB: September 06, 1945, 73 y.o.   MRN: [id number]  HPI Pt returns for f/u of diabetes mellitus: DM type: Insulin-requiring type 2 Dx'ed: 7017 Complications: renal insufficiency and BDR.   Therapy: insulin since 2009 DKA: never.  Severe hypoglycemia: never.   Pancreatitis: never.   Other: he takes qd insulin, after poor results with taking it more frequently; he declines weight loss surgery.   Interval history:  He says he never misses the insulin.  He takes 80-100 units (less if active).  no cbg record, but states cbg is seldom low, and these episodes are mild.  This happens with activity.   Past Medical History:  Diagnosis Date  . Colon polyps 12/09/2009   Multiple in descending colon  . Depression   . Diabetes mellitus    type 2  . Diverticulosis 12/09/2009   Moderate  . Hyperlipidemia   . Hypertension   . melanoma dx'd 12/2012   rt arm; surg   . Microalbuminuria     Past Surgical History:  Procedure Laterality Date  . MASS EXCISION  09/29/2011   [facility]-Excision sebaceous cyst on neck  . MELANOMA EXCISION  12/2012  . TONSILLECTOMY AND ADENOIDECTOMY      Social History   Socioeconomic History  . Marital status: Married    Spouse name: Not on file  . Number of children: 4  . Years of education: Not on file  . Highest education level: Not on file  Occupational History  . Occupation: Retired    Fish farm manager: [employer]  . Financial resource strain: Not on file  . Food insecurity:    Worry: Not on file    Inability: Not on file  . Transportation needs:    Medical: Not on file    Non-medical: Not on file  Tobacco Use  . Smoking status: Former Smoker    Packs/day: 1.50    Years: 45.00    Pack years: 67.50    Types: Cigars, Cigarettes    Last attempt to quit: 09/13/1998    Years since quitting: 19.4  . Smokeless tobacco: Never Used  . Tobacco comment: had CT of abd and pelvis   Substance and  Sexual Activity  . Alcohol use: Yes    Alcohol/week: 0.0 standard drinks    Comment: very little/occ  . Drug use: No  . Sexual activity: Never  Lifestyle  . Physical activity:    Days per week: Not on file    Minutes per session: Not on file  . Stress: Not on file  Relationships  . Social connections:    Talks on phone: Not on file    Gets together: Not on file    Attends religious service: Not on file    Active member of club or organization: Not on file    Attends meetings of clubs or organizations: Not on file    Relationship status: Not on file  . Intimate partner violence:    Fear of current or ex partner: Not on file    Emotionally abused: Not on file    Physically abused: Not on file    Forced sexual activity: Not on file  Other Topics Concern  . Not on file  Social History Narrative   Married 4 kids. 6 grandkids.       Retired from [employer]: travel, reading    Current Outpatient Medications on File Prior  to Visit  Medication Sig Dispense Refill  . ACCU-CHEK FASTCLIX LANCETS MISC Use to check blood sugar 2 times per day 200 each 2  . acetaZOLAMIDE (DIAMOX) 125 MG tablet Take 1 tablet (125 mg total) by mouth 2 (two) times daily. Start day before ascent 20 tablet 0  . atorvastatin (LIPITOR) 20 MG tablet TAKE 1 TABLET BY MOUTH  DAILY AT 6PM. 90 tablet 1  . Blood Glucose Monitoring Suppl (ACCU-CHEK GUIDE) w/Device KIT 1 each by Does not apply route 2 (two) times daily. 1 kit 0  . bumetanide (BUMEX) 2 MG tablet Take 1 tablet by mouth  daily (Patient taking differently: Take 2 tablets in the morning and 1 in the afternoon) 90 tablet 3  . Cholecalciferol (VITAMIN D-3) 1000 UNITS CAPS Take 2,000 Units by mouth daily.    . cloNIDine (CATAPRES) 0.1 MG tablet Take 1 tablet (0.1 mg total) by mouth 2 (two) times daily. 60 tablet 5  . diltiazem (CARDIZEM CD) 360 MG 24 hr capsule TAKE 1 CAPSULE BY MOUTH  DAILY WITH BREAKFAST 90 capsule 1  . glucose blood test strip  Use to monitor glucose levels twice per day 100 each 12  . hydrALAZINE (APRESOLINE) 100 MG tablet Take 150 mg by mouth 2 (two) times daily. Taking 1 tablet my mouth in the morning and two in the evening.    . Insulin Pen Needle 31G X 8 MM MISC Inject insulin two times a day. 200 each 2  . labetalol (NORMODYNE) 200 MG tablet TAKE 1 TABLET BY MOUTH TWO  TIMES DAILY (Patient taking differently: TAKE 1 and a half tablets by mouth in the morining and evening. Take 1 tablet by mouth in the afternoon.) 180 tablet 1  . Lancet Devices (AUTOLET IMPRESSION) MISC You to check blood sugar 2 times a day 2 each 1  . Multiple Vitamins-Minerals (CENTURY SENIOR) TABS Take 1 tablet by mouth daily.    . Omega-3 Fatty Acids (FISH OIL) 1200 MG CAPS Take 1,200 mg by mouth daily.    Marland Kitchen PARoxetine (PAXIL) 20 MG tablet TAKE 1 TABLET BY MOUTH  DAILY 90 tablet 1  . sildenafil (VIAGRA) 100 MG tablet Take 1 tablet (100 mg total) by mouth daily as needed for erectile dysfunction (Single Pack requested. May only buy a few pills.). 10 tablet 5  . tadalafil (CIALIS) 20 MG tablet Take 0.5-1 tablets (10-20 mg total) by mouth every other day as needed for erectile dysfunction. 5 tablet 11  . telmisartan (MICARDIS) 80 MG tablet Take 1 tablet (80 mg total) by mouth daily. 90 tablet 3   No current facility-administered medications on file prior to visit.     Allergies  Allergen Reactions  . Amlodipine Swelling  . Spironolactone     gynecomastia    Family History  Problem Relation Age of Onset  . Heart attack Mother        53 from chemo  . Breast cancer Mother        dx. <68; +chemo  . Hypertension Father   . Cerebral aneurysm Father   . BRCA 1/2 Maternal Aunt        never tested, but daughter has known mutation in BRCA2  . Kidney failure Maternal Aunt   . Stomach cancer Maternal Uncle        d. 57; smoker and issues with heartburn  . Heart Problems Paternal Grandfather   . BRCA 1/2 Cousin        BRCA2+; maternal 1st  cousin; first  person tested in family  . Breast cancer Cousin 59    BP (!) 142/70 (BP Location: Left Arm, Patient Position: Sitting, Cuff Size: Large)   Pulse (!) 55   Ht _0  (1.626 m)   Wt 228 lb 12.8 oz (103.8 kg)   SpO2 93%   BMI 39.27 kg/m    Review of Systems He has lost a few lbs.      Objective:   Physical Exam VITAL SIGNS:  See vs page GENERAL: no distress Pulses: dorsalis pedis intact bilat.   MSK: no deformity of the feet CV: 1+ bilat leg edema Skin:  no ulcer on the feet.  normal color and temp on the feet. Neuro: sensation is intact to touch on the feet.    Lab Results  Component Value Date   HGBA1C 7.1 (A) 02/21/2018        Assessment & Plan:  Insulin-requiring type 2 DM, with BDR: this is the best control this pt should aim for, given this regimen, which does match insulin to his changing needs throughout the day Renal insuff: in this setting, he is at risk for nocturnal hypoglycemia HTN: is noted today Hypoglycemia: this limits aggressiveness of glycemic control.  Edema: this limits rx options  Patient Instructions  Your blood pressure is high today.  Please see your primary care provider soon, to have it rechecked check your blood sugar twice a day.  vary the time of day when you check, between before the 3 meals, and at bedtime.  also check if you have symptoms of your blood sugar being too high or too low.  please keep a record of the readings and bring it to your next appointment here.  please call us sooner if your blood sugar goes below 70, or if you have a lot of readings over 200.  On this type of insulin schedule, you should eat meals on a regular schedule.  If a meal is missed or significantly delayed, your blood sugar could go low.    Please reduce the insulin to 100 units each morning.  However, if you are going to be active, take just 70-80 units that morning. Also, if you cannot anticipate the activity, eat a light snack with it.   Please  come back for a follow-up appointment in 4 months.

## 2018-02-21 NOTE — Patient Instructions (Addendum)
Your blood pressure is high today.  Please see your primary care provider soon, to have it rechecked check your blood sugar twice a day.  vary the time of day when you check, between before the 3 meals, and at bedtime.  also check if you have symptoms of your blood sugar being too high or too low.  please keep a record of the readings and bring it to your next appointment here.  please call us sooner if your blood sugar goes below 70, or if you have a lot of readings over 200.  On this type of insulin schedule, you should eat meals on a regular schedule.  If a meal is missed or significantly delayed, your blood sugar could go low.    Please reduce the insulin to 100 units each morning.  However, if you are going to be active, take just 70-80 units that morning. Also, if you cannot anticipate the activity, eat a light snack with it.   Please come back for a follow-up appointment in 4 months.

## 2018-02-28 LAB — HM DIABETES EYE EXAM

## 2018-03-09 ENCOUNTER — Ambulatory Visit: Payer: Medicare Other | Admitting: Family Medicine

## 2018-03-10 ENCOUNTER — Other Ambulatory Visit: Payer: Self-pay | Admitting: Family Medicine

## 2018-06-08 ENCOUNTER — Other Ambulatory Visit: Payer: Self-pay | Admitting: Family Medicine

## 2018-06-08 NOTE — Telephone Encounter (Signed)
Rx sent for 90 day not 30

## 2018-06-08 NOTE — Telephone Encounter (Signed)
Pt needs a f/u appt. I called pt  And he stated that he will call back to schedule the appt. Rx refilled for 30days.

## 2018-06-15 ENCOUNTER — Other Ambulatory Visit: Payer: Self-pay

## 2018-06-15 ENCOUNTER — Ambulatory Visit: Payer: Medicare Other | Admitting: Family Medicine

## 2018-06-15 ENCOUNTER — Encounter: Payer: Self-pay | Admitting: Family Medicine

## 2018-06-15 VITALS — BP 129/70 | HR 51 | Temp 98.2°F | Ht 64.0 in | Wt 227.2 lb

## 2018-06-15 DIAGNOSIS — N529 Male erectile dysfunction, unspecified: Secondary | ICD-10-CM

## 2018-06-15 DIAGNOSIS — I5032 Chronic diastolic (congestive) heart failure: Secondary | ICD-10-CM

## 2018-06-15 DIAGNOSIS — E1159 Type 2 diabetes mellitus with other circulatory complications: Secondary | ICD-10-CM | POA: Diagnosis not present

## 2018-06-15 DIAGNOSIS — E1169 Type 2 diabetes mellitus with other specified complication: Secondary | ICD-10-CM | POA: Diagnosis not present

## 2018-06-15 DIAGNOSIS — E1122 Type 2 diabetes mellitus with diabetic chronic kidney disease: Secondary | ICD-10-CM | POA: Diagnosis not present

## 2018-06-15 DIAGNOSIS — F325 Major depressive disorder, single episode, in full remission: Secondary | ICD-10-CM

## 2018-06-15 DIAGNOSIS — N183 Chronic kidney disease, stage 3 unspecified: Secondary | ICD-10-CM

## 2018-06-15 DIAGNOSIS — I10 Essential (primary) hypertension: Secondary | ICD-10-CM

## 2018-06-15 DIAGNOSIS — Z794 Long term (current) use of insulin: Secondary | ICD-10-CM

## 2018-06-15 DIAGNOSIS — E785 Hyperlipidemia, unspecified: Secondary | ICD-10-CM

## 2018-06-15 NOTE — Progress Notes (Signed)
Phone [phone]   Subjective:  Paul [name] is a 73 y.o. year old very pleasant male patient who presents for/with See problem oriented charting Chief Complaint  Patient presents with  . Hypertension   ROS- no chest pain reported. Stable edema. Does have ED issues. No fever, chills, cough, new shortness of breath, body aches, sore throat, or loss of taste or smell   Past Medical History-  Patient Active Problem List   Diagnosis Date Noted  . 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 Chronic diastolic heart failure ([place]) 11/26/2013    Priority: High  . History of melanoma 12/18/2012    Priority: High  . Type 2 diabetes, controlled, with renal manifestation ([place]) 07/28/2006    Priority: High  . Hypertension associated with diabetes ([place]) 07/28/2006    Priority: High  . CKD (chronic kidney disease), stage III ([place]) 09/19/2014    Priority: Medium  . Obstructive sleep apnea 03/27/2014    Priority: Medium  . Former smoker 01/02/2014    Priority: Medium  . Moderate aortic stenosis 11/26/2013    Priority: Medium  . Hyperlipidemia 07/28/2006    Priority: Medium  . Major depression in full remission ([place]) 07/28/2006    Priority: Medium  . BRCA2 positive 02/06/2015    Priority: Low  . Genetic testing 02/06/2015    Priority: Low  . Diverticulosis of colon with hemorrhage 08/10/2013    Priority: Low  . Hx of adenomatous colonic polyps 08/10/2013    Priority: Low  . Erectile dysfunction 09/04/2009    Priority: Low  . Morbid obesity ([place]) 12/28/2007    Priority: Low  . Gout 06/28/2007    Priority: Low  . Anemia 12/27/2006    Priority: Low  . 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 ACCU-CHEK FASTCLIX LANCETS MISC Use to check blood sugar 2 times per day 200 each 2  . atorvastatin (LIPITOR) 20 MG tablet TAKE 1 TABLET BY MOUTH  DAILY AT 6PM 90  tablet 1  . Blood Glucose Monitoring Suppl (ACCU-CHEK GUIDE) w/Device KIT 1 each by Does not apply route 2 (two) times daily. 1 kit 0  . bumetanide (BUMEX) 2 MG tablet Take 1 tablet by mouth  daily (Patient taking differently: Take 2 tablets in the morning and 1 in the afternoon) 90 tablet 3  . Cholecalciferol (VITAMIN D-3) 1000 UNITS CAPS Take 2,000 Units by mouth daily.    . cloNIDine (CATAPRES) 0.1 MG tablet Take 1 tablet (0.1 mg total) by mouth 2 (two) times daily. 60 tablet 5  . diltiazem (CARDIZEM CD) 360 MG 24 hr capsule TAKE 1 CAPSULE BY MOUTH  DAILY WITH BREAKFAST 90 capsule 1  . glucose blood test strip Use to monitor glucose levels twice per day 100 each 12  . hydrALAZINE (APRESOLINE) 100 MG tablet Take 150 mg by mouth 2 (two) times daily. Taking 1 tablet my mouth in the morning and two in the evening.    . Insulin Glargine (LANTUS SOLOSTAR) 100 UNIT/ML Solostar Pen Inject 100 Units into the skin every morning. 120 mL 3  . Insulin Pen Needle 31G X 8 MM MISC Inject insulin two times a day. 200 each 2  . labetalol (NORMODYNE) 200 MG tablet TAKE 1 TABLET BY MOUTH TWO  TIMES DAILY (Patient taking differently: TAKE 1 and a half tablets by mouth in the morining and evening. Take 1 tablet  by mouth in the afternoon.) 180 tablet 1  . Lancet Devices (AUTOLET IMPRESSION) MISC You to check blood sugar 2 times a day 2 each 1  . Multiple Vitamins-Minerals (CENTURY SENIOR) TABS Take 1 tablet by mouth daily.    . Omega-3 Fatty Acids (FISH OIL) 1200 MG CAPS Take 1,200 mg by mouth daily.    Marland Kitchen PARoxetine (PAXIL) 20 MG tablet TAKE 1 TABLET BY MOUTH  DAILY 90 tablet 1  . sildenafil (VIAGRA) 100 MG tablet Take 1 tablet (100 mg total) by mouth daily as needed for erectile dysfunction (Single Pack requested. May only buy a few pills.). 10 tablet 5  . tadalafil (CIALIS) 20 MG tablet Take 0.5-1 tablets (10-20 mg total) by mouth every other day as needed for erectile dysfunction. 5 tablet 11  . telmisartan  (MICARDIS) 80 MG tablet Take 1 tablet by mouth once daily 90 tablet 0  . isosorbide mononitrate (IMDUR) 30 MG 24 hr tablet      No current facility-administered medications for this visit.      Objective:  BP 129/70   Pulse (!) 51   Temp 98.2 F (36.8 C) (Oral)   Ht '5\' 4"'$  (1.626 m)   Wt 227 lb 3.2 oz (103.1 kg)   SpO2 95%   BMI 39.00 kg/m  Gen: NAD, resting comfortably CV: RRR systolic murmur noted Lungs: CTAB no crackles, wheeze, rhonchi Abdomen: soft/nontender/nondistended Ext: stable 1+ edema Skin: warm, dry    Assessment and Plan   #hypertension/diastolic heart failure S: Reports CHF has been stable.  Blood pressure controlled below meds in office. Mornings still usually over 150 but goes down as day goes on.   AM meds: imdur '30mg'$ , dilt '360mg'$ , bumex 4, hydralazine '100mg'$ , labetalol '300mg'$ , also now on clonidine 0.1 mg  Afternoon: bumex 2 mg, labetalol '200mg'$   PM: telmisartan '80mg'$ , hydralazine '200mg'$  (moved extra '100mg'$  from afternoon to PM per renal), laetbalol '300mg'$ , also now on clonidine 0.1 mg BP Readings from Last 3 Encounters:  06/15/18 129/70  02/21/18 (!) 142/70  02/06/18 (!) 118/58  A/P: Good control in office again with improving control at home-morning blood pressures seem to still be somewhat high.  Heart rate has slowed with clonidine and do not want to slow this further so we do not want increase that at this time.  We opted to continue current medications for now -Also follows with renal and has had some prior work-up for resistant hypertension -CHF appears stable on Bumex- patient declines cardiology follow-up for now  # Obesity (morbid with BMI over 35 and hypertension and hyperlipidemia)  S: down 2 lbs from January- trying to eat healthy- not able to eat out as much. Doing walking as limited as far as gym with covid 19.  Wt Readings from Last 3 Encounters:  06/15/18 227 lb 3.2 oz (103.1 kg)  02/21/18 228 lb 12.8 oz (103.8 kg)  02/06/18 229 lb 6.4 oz  (104.1 kg)  A/P: Slight improvement- Encouraged need for healthy eating, regular exercise, weight loss.    #hyperlipidemia S: Reasonably controlled on atorvastatin 20 mg Lab Results  Component Value Date   CHOL 148 09/05/2017   HDL 38.00 (L) 09/05/2017   LDLCALC 71 09/05/2017   TRIG 198.0 (H) 09/05/2017   CHOLHDL 4 09/05/2017   A/P: Ideally LDL would be under 70 but he is close-we opted to continue to work on healthy eating and regular exercise before increasing statin dose.  # Depression S: Compliant with Paxil-patient reports reasonably good control Depression  screen PHQ 2/9 06/15/2018  Decreased Interest 0  Down, Depressed, Hopeless 0  PHQ - 2 Score 0  Altered sleeping 0  Tired, decreased energy 2  Change in appetite 0  Feeling bad or failure about yourself  0  Trouble concentrating 0  Moving slowly or fidgety/restless 0  Suicidal thoughts 0  PHQ-9 Score 2  Difficult doing work/chores Not difficult at all  A/P: Stable. Continue current medications.    #ED S: cialis and viagra have not been helpful.   A/P: wants to explore other options- will refer to urology  CKD (chronic kidney disease), stage III ([place]) S: GFR has been stable primarily in the low 40s.  Patient avoids NSAIDs.  Patient to follow-up with Dr. Justin Mend of [facility] A/P:  Stable. Continue current medications.     A1c, cmp, direct LDL-patient will come by for labs.  He will wear a mask.  COVID screen negative today Future Appointments  Date Time Provider [place]  06/22/2018 10:00 AM Renato Shin, MD [id number] None   Lab/Order associations: Controlled type 2 diabetes mellitus with stage 3 chronic kidney disease, with long-term current use of insulin ([place]) - Plan: Comprehensive metabolic panel, Hemoglobin A1c  CKD (chronic kidney disease), stage III ([place]) - Plan: Comprehensive metabolic panel, Hemoglobin A1c  Hyperlipidemia associated with type 2 diabetes mellitus ([place]) - Plan: LDL cholesterol,  direct  Erectile dysfunction, unspecified erectile dysfunction type - Plan: Ambulatory referral to Urology, CANCELED: Ambulatory referral to Urology  Chronic diastolic heart failure ([place]), Chronic  Morbid obesity ([place]), Chronic  Major depressive disorder with single episode, in full remission ([place]), Chronic  Hypertension associated with diabetes ([place])  No orders of the defined types were placed in this encounter.   Return precautions advised.  Garret Reddish, MD

## 2018-06-15 NOTE — Assessment & Plan Note (Signed)
S: GFR has been stable primarily in the low 40s.  Patient avoids NSAIDs.  Patient to follow-up with Dr. Justin Mend of [facility] A/P:  Stable. Continue current medications.

## 2018-06-15 NOTE — Assessment & Plan Note (Signed)
We are getting an A1c with labs-we will forward that to Dr. Loanne Drilling so patient could potentially do a video visit instead of in person evaluation

## 2018-06-15 NOTE — Patient Instructions (Addendum)
Please stop by lab before you go If you do not have mychart- we will call you about results within 5 business days of [place] receiving them.  If you have mychart- we will send your results within 3 business days of [place] receiving them.  If abnormal or we want to clarify a result, we will call or mychart you to make sure you receive the message.  If you have questions or concerns or don't hear within 5-7 days, please send [place] a message or call us.    We will call you within two weeks about your referral to urology. If you do not hear within 3 weeks, give [place] a call.

## 2018-06-15 NOTE — Assessment & Plan Note (Signed)
S: cialis and viagra have not been helpful.   A/P: wants to explore other options- will refer to urology

## 2018-06-15 NOTE — Assessment & Plan Note (Signed)
S: controlled below meds in office. Mornings still usually over 150 but goes down as day goes on.   AM meds: imdur 30mg , dilt 360mg , bumex 4, hydralazine 100mg , labetalol 300mg , also now on clonidine 0.1 mg  Afternoon: bumex 2 mg, labetalol 200mg   PM: telmisartan 80mg , hydralazine 200mg  (moved extra 100mg  from afternoon to PM per renal), laetbalol 300mg , also now on clonidine 0.1 mg BP Readings from Last 3 Encounters:  06/15/18 129/70  02/21/18 (!) 142/70  02/06/18 (!) 118/58  A/P: Good control in office again with improving control at home-morning blood pressures seem to still be somewhat high.  Heart rate has slowed with clonidine and do not want to slow this further so we do not want increase that at this time.  We opted to continue current medications for now -Also follows with renal and has had some prior work-up for resistant hypertension

## 2018-06-16 LAB — COMPREHENSIVE METABOLIC PANEL
ALT: 45 U/L (ref 0–53)
AST: 32 U/L (ref 0–37)
Albumin: 3.7 g/dL (ref 3.5–5.2)
Alkaline Phosphatase: 88 U/L (ref 39–117)
BUN: 30 mg/dL — ABNORMAL HIGH (ref 6–23)
CO2: 31 mEq/L (ref 19–32)
Calcium: 9.4 mg/dL (ref 8.4–10.5)
Chloride: 105 mEq/L (ref 96–112)
Creatinine, Ser: 1.73 mg/dL — ABNORMAL HIGH (ref 0.40–1.50)
GFR: 38.71 mL/min — ABNORMAL LOW (ref >60.00)
Glucose, Bld: 160 mg/dL — ABNORMAL HIGH (ref 70–99)
Potassium: 3.8 mEq/L (ref 3.5–5.1)
Sodium: 143 mEq/L (ref 135–145)
Total Bilirubin: 0.4 mg/dL (ref 0.2–1.2)
Total Protein: 6.4 g/dL (ref 6.0–8.3)

## 2018-06-16 LAB — LDL CHOLESTEROL, DIRECT: Direct LDL: 86 mg/dL

## 2018-06-16 LAB — HEMOGLOBIN A1C: Hgb A1c MFr Bld: 8.3 % — ABNORMAL HIGH (ref 4.6–6.5)

## 2018-06-22 ENCOUNTER — Ambulatory Visit: Payer: Medicare Other | Admitting: Endocrinology

## 2018-06-26 DIAGNOSIS — E11319 Type 2 diabetes mellitus with unspecified diabetic retinopathy without macular edema: Secondary | ICD-10-CM

## 2018-06-26 HISTORY — DX: Type 2 diabetes mellitus with unspecified diabetic retinopathy without macular edema: E11.319

## 2018-06-26 LAB — HM DIABETES EYE EXAM

## 2018-06-27 ENCOUNTER — Encounter: Payer: Self-pay | Admitting: Endocrinology

## 2018-06-27 ENCOUNTER — Other Ambulatory Visit: Payer: Self-pay

## 2018-06-27 ENCOUNTER — Ambulatory Visit: Payer: Medicare Other | Admitting: Endocrinology

## 2018-06-27 ENCOUNTER — Encounter: Payer: Self-pay | Admitting: *Deleted

## 2018-06-27 VITALS — BP 134/62 | HR 54 | Temp 98.0°F | Wt 230.0 lb

## 2018-06-27 DIAGNOSIS — N183 Chronic kidney disease, stage 3 unspecified: Secondary | ICD-10-CM

## 2018-06-27 DIAGNOSIS — Z794 Long term (current) use of insulin: Secondary | ICD-10-CM

## 2018-06-27 DIAGNOSIS — E1122 Type 2 diabetes mellitus with diabetic chronic kidney disease: Secondary | ICD-10-CM | POA: Diagnosis not present

## 2018-06-27 MED ORDER — INSULIN GLARGINE 100 UNIT/ML SOLOSTAR PEN: 120.0000 [IU] | PEN_INJECTOR | SUBCUTANEOUS | 3 refills | Status: DC

## 2018-06-27 NOTE — Patient Instructions (Addendum)
check your blood sugar twice a day.  vary the time of day when you check, between before the 3 meals, and at bedtime.  also check if you have symptoms of your blood sugar being too high or too low.  please keep a record of the readings and bring it to your next appointment here.  please call us sooner if your blood sugar goes below 70, or if you have a lot of readings over 200.   On this type of insulin schedule, you should eat meals on a regular schedule.  If a meal is missed or significantly delayed, your blood sugar could go low.     Please continue the same Lantus.  However, if you are going to be active, take just 70-80 units that morning. Also, if you cannot anticipate the activity, eat a light snack with it.   Please come back for a follow-up appointment in 2-3 months.

## 2018-06-27 NOTE — Progress Notes (Signed)
Subjective:    Patient ID: Paul [name], male    DOB: 07-13-1945, 73 y.o.   MRN: [id number]  HPI Pt returns for f/u of diabetes mellitus: DM type: Insulin-requiring type 2 Dx'ed: 7340 Complications: renal insufficiency and BDR.  Therapy: insulin since 2009.   DKA: never.  Severe hypoglycemia: never.   Pancreatitis: never.   Other: he takes qd insulin, after poor results with taking it more frequently; he declines weight loss surgery.   Interval history:  He says he never misses the insulin.  3 weeks ago, he increased lantus to 120 units qam, due to hyperglycemia.  pt states cbg is seldom low, and these episodes are mild.  This happens fasting.  Meter is downloaded today, and the printout is scanned into the record.  There are 3 cbg's--all fasting.  They vary from 68-188.  He is less active recently.   Past Medical History:  Diagnosis Date  . Colon polyps 12/09/2009   Multiple in descending colon  . Depression   . Diabetes mellitus    type 2  . Diverticulosis 12/09/2009   Moderate  . Hyperlipidemia   . Hypertension   . melanoma dx'd 12/2012   rt arm; surg   . Microalbuminuria     Past Surgical History:  Procedure Laterality Date  . MASS EXCISION  09/29/2011   [facility]-Excision sebaceous cyst on neck  . MELANOMA EXCISION  12/2012  . TONSILLECTOMY AND ADENOIDECTOMY      Social History   Socioeconomic History  . Marital status: Married    Spouse name: Not on file  . Number of children: 4  . Years of education: Not on file  . Highest education level: Not on file  Occupational History  . Occupation: Retired    Fish farm manager: [employer]  . Financial resource strain: Not on file  . Food insecurity:    Worry: Not on file    Inability: Not on file  . Transportation needs:    Medical: Not on file    Non-medical: Not on file  Tobacco Use  . Smoking status: Former Smoker    Packs/day: 1.50    Years: 45.00    Pack years: 67.50    Types: Cigars,  Cigarettes    Last attempt to quit: 09/13/1998    Years since quitting: 19.8  . Smokeless tobacco: Never Used  . Tobacco comment: had CT of abd and pelvis   Substance and Sexual Activity  . Alcohol use: Yes    Alcohol/week: 0.0 standard drinks    Comment: very little/occ  . Drug use: No  . Sexual activity: Never  Lifestyle  . Physical activity:    Days per week: Not on file    Minutes per session: Not on file  . Stress: Not on file  Relationships  . Social connections:    Talks on phone: Not on file    Gets together: Not on file    Attends religious service: Not on file    Active member of club or organization: Not on file    Attends meetings of clubs or organizations: Not on file    Relationship status: Not on file  . Intimate partner violence:    Fear of current or ex partner: Not on file    Emotionally abused: Not on file    Physically abused: Not on file    Forced sexual activity: Not on file  Other Topics Concern  . Not on file  Social History Narrative  Married 4 kids. 6 grandkids.       Retired from [employer]: travel, reading    Current Outpatient Medications on File Prior to Visit  Medication Sig Dispense Refill  . ACCU-CHEK FASTCLIX LANCETS MISC Use to check blood sugar 2 times per day 200 each 2  . atorvastatin (LIPITOR) 20 MG tablet TAKE 1 TABLET BY MOUTH  DAILY AT 6PM 90 tablet 1  . Blood Glucose Monitoring Suppl (ACCU-CHEK GUIDE) w/Device KIT 1 each by Does not apply route 2 (two) times daily. 1 kit 0  . bumetanide (BUMEX) 2 MG tablet Take 1 tablet by mouth  daily (Patient taking differently: Take 2 tablets in the morning and 1 in the afternoon) 90 tablet 3  . Cholecalciferol (VITAMIN D-3) 1000 UNITS CAPS Take 2,000 Units by mouth daily.    . cloNIDine (CATAPRES) 0.1 MG tablet Take 1 tablet (0.1 mg total) by mouth 2 (two) times daily. 60 tablet 5  . diltiazem (CARDIZEM CD) 360 MG 24 hr capsule TAKE 1 CAPSULE BY MOUTH  DAILY WITH BREAKFAST 90  capsule 1  . glucose blood test strip Use to monitor glucose levels twice per day 100 each 12  . hydrALAZINE (APRESOLINE) 100 MG tablet Take 150 mg by mouth 2 (two) times daily. Taking 1 tablet my mouth in the morning and two in the evening.    . Insulin Pen Needle 31G X 8 MM MISC Inject insulin two times a day. 200 each 2  . isosorbide mononitrate (IMDUR) 30 MG 24 hr tablet     . labetalol (NORMODYNE) 200 MG tablet TAKE 1 TABLET BY MOUTH TWO  TIMES DAILY (Patient taking differently: TAKE 1 and a half tablets by mouth in the morining and evening. Take 1 tablet by mouth in the afternoon.) 180 tablet 1  . Lancet Devices (AUTOLET IMPRESSION) MISC You to check blood sugar 2 times a day 2 each 1  . Multiple Vitamins-Minerals (CENTURY SENIOR) TABS Take 1 tablet by mouth daily.    . Omega-3 Fatty Acids (FISH OIL) 1200 MG CAPS Take 1,200 mg by mouth daily.    Marland Kitchen PARoxetine (PAXIL) 20 MG tablet TAKE 1 TABLET BY MOUTH  DAILY 90 tablet 1  . sildenafil (VIAGRA) 100 MG tablet Take 1 tablet (100 mg total) by mouth daily as needed for erectile dysfunction (Single Pack requested. May only buy a few pills.). 10 tablet 5  . tadalafil (CIALIS) 20 MG tablet Take 0.5-1 tablets (10-20 mg total) by mouth every other day as needed for erectile dysfunction. 5 tablet 11  . telmisartan (MICARDIS) 80 MG tablet Take 1 tablet by mouth once daily 90 tablet 0   No current facility-administered medications on file prior to visit.     Allergies  Allergen Reactions  . Amlodipine Swelling  . Spironolactone     gynecomastia    Family History  Problem Relation Age of Onset  . Heart attack Mother        72 from chemo  . Breast cancer Mother        dx. <68; +chemo  . Hypertension Father   . Cerebral aneurysm Father   . BRCA 1/2 Maternal Aunt        never tested, but daughter has known mutation in BRCA2  . Kidney failure Maternal Aunt   . Stomach cancer Maternal Uncle        d. 35; smoker and issues with heartburn  .  Heart Problems Paternal Grandfather   .  BRCA 1/2 Cousin        BRCA2+; maternal 1st cousin; first person tested in family  . Breast cancer Cousin 59    BP 134/62 (BP Location: Right Arm, Patient Position: Sitting, Cuff Size: Normal)   Pulse (!) 54   Temp 98 F (36.7 C) (Oral)   Wt 230 lb (104.3 kg)   SpO2 95%   BMI 39.48 kg/m    Review of Systems Denies LOC    Objective:   Physical Exam VITAL SIGNS:  See vs page GENERAL: no distress Pulses: dorsalis pedis intact bilat.   MSK: no deformity of the feet CV: 1+ bilat leg edema Skin:  no ulcer on the feet.  normal color and temp on the feet. Neuro: sensation is intact to touch on the feet.     Lab Results  Component Value Date   HGBA1C 8.3 (H) 06/15/2018   Lab Results  Component Value Date   CREATININE 1.73 (H) 06/15/2018   BUN 30 (H) 06/15/2018   NA 143 06/15/2018   K 3.8 06/15/2018   CL 105 06/15/2018   CO2 31 06/15/2018      Assessment & Plan:  Insulin-requiring type 2 DM, with DR: worse Renal insuff: in this setting, he is at risk for more fasting hypoglycemia.  I considered changing to QAM NPH.  However. He says a1c will improve with time on this insulin dosage.  Also, pt say activity will improve soon.  I told he we may ned to reduce the insulin again.   Hypoglycemia: This limits aggressiveness of glycemic control.    Patient Instructions  check your blood sugar twice a day.  vary the time of day when you check, between before the 3 meals, and at bedtime.  also check if you have symptoms of your blood sugar being too high or too low.  please keep a record of the readings and bring it to your next appointment here.  please call us sooner if your blood sugar goes below 70, or if you have a lot of readings over 200.   On this type of insulin schedule, you should eat meals on a regular schedule.  If a meal is missed or significantly delayed, your blood sugar could go low.     Please continue the same Lantus.   However, if you are going to be active, take just 70-80 units that morning. Also, if you cannot anticipate the activity, eat a light snack with it.   Please come back for a follow-up appointment in 2-3 months.

## 2018-07-12 ENCOUNTER — Encounter: Payer: Self-pay | Admitting: Physical Therapy

## 2018-07-12 ENCOUNTER — Encounter: Payer: Self-pay | Admitting: Family Medicine

## 2018-08-08 ENCOUNTER — Telehealth: Payer: Self-pay | Admitting: Family Medicine

## 2018-08-08 MED ORDER — CLONIDINE HCL 0.1 MG PO TABS: 0.1000 mg | ORAL_TABLET | Freq: Two times a day (BID) | ORAL | 1 refills | Status: AC

## 2018-08-08 NOTE — Telephone Encounter (Signed)
Medication Refill - Medication: cloNIDine (CATAPRES) 0.1 MG tablet / Pt stated he is out of state in [place] and is out of his clonidine. Requesting refill today so that his daughter can overnight it with his other medications. Would like a callback once rx has been sent to pharmacy. CB#[phone]  Has the patient contacted their pharmacy? No. (Agent: If no, request that the patient contact the pharmacy for the refill.) (Agent: If yes, when and what did the pharmacy advise?)  Preferred Pharmacy (with phone number or street name):  [place], [place] - [address] [phone] (Phone) [phone] (Fax)     Agent: Please be advised that RX refills may take up to 3 business days. We ask that you follow-up with your pharmacy.

## 2018-08-08 NOTE — Telephone Encounter (Signed)
Rx refilled. Called pt and advised.

## 2018-09-10 ENCOUNTER — Other Ambulatory Visit: Payer: Self-pay | Admitting: Family Medicine

## 2018-09-14 ENCOUNTER — Other Ambulatory Visit: Payer: Self-pay

## 2018-09-19 ENCOUNTER — Ambulatory Visit: Payer: Medicare Other | Admitting: Endocrinology

## 2018-09-19 ENCOUNTER — Encounter: Payer: Self-pay | Admitting: Endocrinology

## 2018-09-19 ENCOUNTER — Other Ambulatory Visit: Payer: Self-pay

## 2018-09-19 VITALS — BP 82/44 | HR 60 | Ht 64.0 in | Wt 231.0 lb

## 2018-09-19 DIAGNOSIS — Z794 Long term (current) use of insulin: Secondary | ICD-10-CM

## 2018-09-19 DIAGNOSIS — N183 Chronic kidney disease, stage 3 unspecified: Secondary | ICD-10-CM

## 2018-09-19 DIAGNOSIS — E1122 Type 2 diabetes mellitus with diabetic chronic kidney disease: Secondary | ICD-10-CM

## 2018-09-19 LAB — POCT GLYCOSYLATED HEMOGLOBIN (HGB A1C): Hemoglobin A1C: 8.1 % — AB (ref 4.0–5.6)

## 2018-09-19 MED ORDER — LANTUS SOLOSTAR 100 UNIT/ML ~~LOC~~ SOPN: 105.0000 [IU] | PEN_INJECTOR | SUBCUTANEOUS | 3 refills | Status: DC

## 2018-09-19 NOTE — Progress Notes (Signed)
Subjective:    Patient ID: Paul [name], male    DOB: [birth year], 73 y.o.   MRN: [id number]  HPI Pt returns for f/u of diabetes mellitus: DM type: Insulin-requiring type 2 Dx'ed: 7858 Complications: renal insufficiency and BDR.  Therapy: insulin since 2009.   DKA: never.  Severe hypoglycemia: never.   Pancreatitis: never.   Other: he takes qd insulin, after poor results with taking it more frequently; he declines weight loss surgery.   Interval history:  He says he never misses the insulin.  He takes lantus, 100 units qam, due to hyperglycemia.  He brings his meter with his cbg's which I have reviewed today.  cbg's vary from 80-214.  Pt says he does not know why BP is low today.   Past Medical History:  Diagnosis Date  . Colon polyps 12/09/2009   Multiple in descending colon  . Depression   . Diabetes mellitus    type 2  . Diabetic retinopathy associated with controlled type 2 diabetes mellitus ([facility]) 06/26/2018  . Diverticulosis 12/09/2009   Moderate  . Hyperlipidemia   . Hypertension   . melanoma dx'd 12/2012   rt arm; surg   . Microalbuminuria     Past Surgical History:  Procedure Laterality Date  . MASS EXCISION  09/29/2011   [facility]-Excision sebaceous cyst on neck  . MELANOMA EXCISION  12/2012  . TONSILLECTOMY AND ADENOIDECTOMY      Social History   Socioeconomic History  . Marital status: Married    Spouse name: Not on file  . Number of children: 4  . Years of education: Not on file  . Highest education level: Not on file  Occupational History  . Occupation: Retired    Fish farm manager: [employer]  . Financial resource strain: Not on file  . Food insecurity    Worry: Not on file    Inability: Not on file  . Transportation needs    Medical: Not on file    Non-medical: Not on file  Tobacco Use  . Smoking status: Former Smoker    Packs/day: 1.50    Years: 45.00    Pack years: 67.50    Types: Cigars, Cigarettes    Quit date: 09/13/1998   Years since quitting: 20.0  . Smokeless tobacco: Never Used  . Tobacco comment: had CT of abd and pelvis   Substance and Sexual Activity  . Alcohol use: Yes    Alcohol/week: 0.0 standard drinks    Comment: very little/occ  . Drug use: No  . Sexual activity: Never  Lifestyle  . Physical activity    Days per week: Not on file    Minutes per session: Not on file  . Stress: Not on file  Relationships  . Social Herbalist on phone: Not on file    Gets together: Not on file    Attends religious service: Not on file    Active member of club or organization: Not on file    Attends meetings of clubs or organizations: Not on file    Relationship status: Not on file  . Intimate partner violence    Fear of current or ex partner: Not on file    Emotionally abused: Not on file    Physically abused: Not on file    Forced sexual activity: Not on file  Other Topics Concern  . Not on file  Social History Narrative   Married 4 kids. 6 grandkids.  Retired from [employer]: travel, reading    Current Outpatient Medications on File Prior to Visit  Medication Sig Dispense Refill  . ACCU-CHEK FASTCLIX LANCETS MISC Use to check blood sugar 2 times per day 200 each 2  . atorvastatin (LIPITOR) 20 MG tablet TAKE 1 TABLET BY MOUTH  DAILY AT 6PM 90 tablet 1  . Blood Glucose Monitoring Suppl (ACCU-CHEK GUIDE) w/Device KIT 1 each by Does not apply route 2 (two) times daily. 1 kit 0  . bumetanide (BUMEX) 2 MG tablet Take 1 tablet by mouth  daily (Patient taking differently: Take 2 tablets in the morning and 1 in the afternoon) 90 tablet 3  . Cholecalciferol (VITAMIN D-3) 1000 UNITS CAPS Take 2,000 Units by mouth daily.    . cloNIDine (CATAPRES) 0.1 MG tablet Take 1 tablet (0.1 mg total) by mouth 2 (two) times daily. 180 tablet 1  . diltiazem (CARDIZEM CD) 360 MG 24 hr capsule TAKE 1 CAPSULE BY MOUTH  DAILY WITH BREAKFAST 90 capsule 1  . glucose blood test strip Use to monitor  glucose levels twice per day 100 each 12  . hydrALAZINE (APRESOLINE) 100 MG tablet Take 150 mg by mouth 2 (two) times daily. Taking 1 tablet my mouth in the morning and two in the evening.    . Insulin Pen Needle 31G X 8 MM MISC Inject insulin two times a day. 200 each 2  . isosorbide mononitrate (IMDUR) 30 MG 24 hr tablet     . labetalol (NORMODYNE) 200 MG tablet TAKE 1 TABLET BY MOUTH TWO  TIMES DAILY (Patient taking differently: TAKE 1 and a half tablets by mouth in the morining and evening. Take 1 tablet by mouth in the afternoon.) 180 tablet 1  . Lancet Devices (AUTOLET IMPRESSION) MISC You to check blood sugar 2 times a day 2 each 1  . Multiple Vitamins-Minerals (CENTURY SENIOR) TABS Take 1 tablet by mouth daily.    . Omega-3 Fatty Acids (FISH OIL) 1200 MG CAPS Take 1,200 mg by mouth daily.    Marland Kitchen PARoxetine (PAXIL) 20 MG tablet TAKE 1 TABLET BY MOUTH  DAILY 90 tablet 1  . sildenafil (VIAGRA) 100 MG tablet Take 1 tablet (100 mg total) by mouth daily as needed for erectile dysfunction (Single Pack requested. May only buy a few pills.). 10 tablet 5  . tadalafil (CIALIS) 20 MG tablet Take 0.5-1 tablets (10-20 mg total) by mouth every other day as needed for erectile dysfunction. 5 tablet 11  . telmisartan (MICARDIS) 80 MG tablet Take 1 tablet by mouth once daily 90 tablet 1   No current facility-administered medications on file prior to visit.     Allergies  Allergen Reactions  . Amlodipine Swelling  . Spironolactone     gynecomastia    Family History  Problem Relation Age of Onset  . Heart attack Mother        6 from chemo  . Breast cancer Mother        dx. <68; +chemo  . Hypertension Father   . Cerebral aneurysm Father   . BRCA 1/2 Maternal Aunt        never tested, but daughter has known mutation in BRCA2  . Kidney failure Maternal Aunt   . Stomach cancer Maternal Uncle        d. 28; smoker and issues with heartburn  . Heart Problems Paternal Grandfather   . BRCA 1/2 Cousin  BRCA2+; maternal 1st cousin; first person tested in family  . Breast cancer Cousin 59    BP (!) 82/44 (BP Location: Left Arm, Patient Position: Sitting, Cuff Size: Large)   Pulse 60   Ht '5\' 4"'$  (1.626 m)   Wt 231 lb (104.8 kg)   SpO2 95%   BMI 39.65 kg/m    Review of Systems Denies dizziness and bleeding.     Objective:   Physical Exam VITAL SIGNS:  See vs page GENERAL: no distress Pulses: dorsalis pedis intact bilat.   MSK: no deformity of the feet CV: 1+ leg edema Skin:  no ulcer on the feet.  normal color and temp on the feet.   Neuro: sensation is intact to touch on the feet.   Ext: there is bilateral onychomycosis of the toenails.   Lab Results  Component Value Date   HGBA1C 8.1 (A) 09/19/2018   Lab Results  Component Value Date   CREATININE 1.73 (H) 06/15/2018   BUN 30 (H) 06/15/2018   NA 143 06/15/2018   K 3.8 06/15/2018   CL 105 06/15/2018   CO2 31 06/15/2018      Assessment & Plan:  Hypotension, new to me.  He may be very sensitive to med changes Insulin-requiring type 2 DM, with renal insuff: he needs increased rx Hypoglycemia: although this is better, this limits aggressiveness of glycemic control.    Patient Instructions  Please stop these meds for now: clonidine, diltiazem, isosorbide, hydralazine, bumetainide, and labetalol. Please have the blood pressure rechecked with Dr Yong Channel tomorrow. check your blood sugar twice a day.  vary the time of day when you check, between before the 3 meals, and at bedtime.  also check if you have symptoms of your blood sugar being too high or too low.  please keep a record of the readings and bring it to your next appointment here.  please call us sooner if your blood sugar goes below 70, or if you have a lot of readings over 200.  Please increase the insulin to 105 units each morning.   On this type of insulin schedule, you should eat meals on a regular schedule.  If a meal is missed or significantly delayed,  your blood sugar could go low.   Please come back for a follow-up appointment in 2 months.

## 2018-09-19 NOTE — Patient Instructions (Addendum)
Please stop these meds for now: clonidine, diltiazem, isosorbide, hydralazine, bumetainide, and labetalol. Please have the blood pressure rechecked with Dr Yong Channel tomorrow. check your blood sugar twice a day.  vary the time of day when you check, between before the 3 meals, and at bedtime.  also check if you have symptoms of your blood sugar being too high or too low.  please keep a record of the readings and bring it to your next appointment here.  please call us sooner if your blood sugar goes below 70, or if you have a lot of readings over 200.  Please increase the insulin to 105 units each morning.   On this type of insulin schedule, you should eat meals on a regular schedule.  If a meal is missed or significantly delayed, your blood sugar could go low.   Please come back for a follow-up appointment in 2 months.

## 2018-10-20 ENCOUNTER — Encounter: Payer: Self-pay | Admitting: Gastroenterology

## 2018-11-15 ENCOUNTER — Encounter: Payer: Self-pay | Admitting: Gastroenterology

## 2018-11-17 ENCOUNTER — Other Ambulatory Visit: Payer: Self-pay | Admitting: Endocrinology

## 2018-11-21 ENCOUNTER — Ambulatory Visit: Payer: Medicare Other | Admitting: Endocrinology

## 2018-11-23 ENCOUNTER — Telehealth: Payer: Self-pay | Admitting: Family Medicine

## 2018-11-23 NOTE — Telephone Encounter (Signed)
I left a message asking the patient to call and schedule Medicare AWV with Courtney ([facility]).  If patient calls back, please schedule Medicare Wellness Visit at next available opening.  VDM (Dee-Dee) °

## 2018-12-04 ENCOUNTER — Ambulatory Visit (AMBULATORY_SURGERY_CENTER): Payer: Medicare Other | Admitting: *Deleted

## 2018-12-04 ENCOUNTER — Other Ambulatory Visit: Payer: Self-pay

## 2018-12-04 ENCOUNTER — Encounter: Payer: Self-pay | Admitting: Gastroenterology

## 2018-12-04 VITALS — Temp 96.9°F | Ht 64.0 in | Wt 233.0 lb

## 2018-12-04 DIAGNOSIS — Z86010 Personal history of colonic polyps: Secondary | ICD-10-CM

## 2018-12-04 DIAGNOSIS — Z1159 Encounter for screening for other viral diseases: Secondary | ICD-10-CM

## 2018-12-04 MED ORDER — NA SULFATE-K SULFATE-MG SULF 17.5-3.13-1.6 GM/177ML PO SOLN: 1.0000 | Freq: Once | ORAL | 0 refills | Status: AC

## 2018-12-04 NOTE — Progress Notes (Signed)
No egg or soy allergy known to patient  No issues with past sedation with any surgeries  or procedures, no intubation problems  No diet pills per patient No home 02 use per patient  No blood thinners per patient  Pt denies issues with constipation  No A fib or A flutter  EMMI video sent to pt's e mail   Due to the COVID-19 pandemic we are asking patients to follow these guidelines. Please only bring one care partner. Please be aware that your care partner may wait in the car in the parking lot or if they feel like they will be too hot to wait in the car, they may wait in the lobby on the 4th floor. All care partners are required to wear a mask the entire time (we do not have any that we can provide them), they need to practice social distancing, and we will do a Covid check for all patient's and care partners when you arrive. Also we will check their temperature and your temperature. If the care partner waits in their car they need to stay in the parking lot the entire time and we will call them on their cell phone when the patient is ready for discharge so they can bring the car to the front of the building. Also all patient's will need to wear a mask into building.  COVID 12/13/18, 12:25

## 2018-12-13 ENCOUNTER — Other Ambulatory Visit: Payer: Self-pay

## 2018-12-13 ENCOUNTER — Other Ambulatory Visit: Payer: Self-pay | Admitting: Gastroenterology

## 2018-12-13 ENCOUNTER — Ambulatory Visit (INDEPENDENT_AMBULATORY_CARE_PROVIDER_SITE_OTHER): Payer: Medicare Other | Admitting: Endocrinology

## 2018-12-13 VITALS — BP 110/72 | HR 57 | Ht 64.0 in | Wt 227.0 lb

## 2018-12-13 DIAGNOSIS — E1122 Type 2 diabetes mellitus with diabetic chronic kidney disease: Secondary | ICD-10-CM

## 2018-12-13 DIAGNOSIS — N183 Chronic kidney disease, stage 3 unspecified: Secondary | ICD-10-CM

## 2018-12-13 DIAGNOSIS — Z794 Long term (current) use of insulin: Secondary | ICD-10-CM | POA: Diagnosis not present

## 2018-12-13 LAB — POCT GLYCOSYLATED HEMOGLOBIN (HGB A1C): Hemoglobin A1C: 7.3 % — AB (ref 4.0–5.6)

## 2018-12-13 MED ORDER — LANTUS SOLOSTAR 100 UNIT/ML ~~LOC~~ SOPN: 105.0000 [IU] | PEN_INJECTOR | SUBCUTANEOUS | 3 refills | Status: AC

## 2018-12-13 NOTE — Progress Notes (Signed)
Subjective:    Patient ID: Paul [name], male    DOB: 1945-03-17, 73 y.o.   MRN: [id number]  HPI Pt returns for f/u of diabetes mellitus: DM type: Insulin-requiring type 2 Dx'ed: 5573 Complications: renal insufficiency and BDR.  Therapy: insulin since 2009.   DKA: never.  Severe hypoglycemia: never.   Pancreatitis: never.   Other: he takes qd insulin, after poor results with taking it more frequently; he declines weight loss surgery.   Interval history:  He says he never misses the insulin.  He takes lantus, 105 units qam.  He seldom checks cbg.    Past Medical History:  Diagnosis Date   Colon polyps 12/09/2009   Multiple in descending colon   Depression    Diabetes mellitus    type 2   Diabetic retinopathy associated with controlled type 2 diabetes mellitus ([place]) 06/26/2018   Diverticulosis 12/09/2009   Moderate   Heart murmur    Hyperlipidemia    Hypertension    melanoma dx'd 12/2012   rt arm; surg    Microalbuminuria    Sleep apnea    CPAP    Past Surgical History:  Procedure Laterality Date   COLONOSCOPY     MASS EXCISION  09/29/2011   [facility]-Excision sebaceous cyst on neck   MELANOMA EXCISION  12/2012   POLYPECTOMY     TONSILLECTOMY AND ADENOIDECTOMY      Social History   Socioeconomic History   Marital status: Married    Spouse name: Not on file   Number of children: 4   Years of education: Not on file   Highest education level: Not on file  Occupational History   Occupation: Retired    Fish farm manager: [employer] resource strain: Not on file   Food insecurity    Worry: Not on file    Inability: Not on file   Transportation needs    Medical: Not on file    Non-medical: Not on file  Tobacco Use   Smoking status: Former Smoker    Packs/day: 1.50    Years: 45.00    Pack years: 67.50    Types: Cigars, Cigarettes    Quit date: 09/13/1998    Years since quitting: 20.2   Smokeless tobacco: Never  Used   Tobacco comment: had CT of abd and pelvis   Substance and Sexual Activity   Alcohol use: Yes    Alcohol/week: 0.0 standard drinks    Comment: very little/occ   Drug use: No   Sexual activity: Never  Lifestyle   Physical activity    Days per week: Not on file    Minutes per session: Not on file   Stress: Not on file  Relationships   Social connections    Talks on phone: Not on file    Gets together: Not on file    Attends religious service: Not on file    Active member of club or organization: Not on file    Attends meetings of clubs or organizations: Not on file    Relationship status: Not on file   Intimate partner violence    Fear of current or ex partner: Not on file    Emotionally abused: Not on file    Physically abused: Not on file    Forced sexual activity: Not on file  Other Topics Concern   Not on file  Social History Narrative   Married 4 kids. 6 grandkids.  Retired from [employer]: travel, reading    Current Outpatient Medications on File Prior to Visit  Medication Sig Dispense Refill   ACCU-CHEK FASTCLIX LANCETS MISC Use to check blood sugar 2 times per day 200 each 2   atorvastatin (LIPITOR) 20 MG tablet TAKE 1 TABLET BY MOUTH  DAILY AT 6PM 90 tablet 1   Blood Glucose Monitoring Suppl (ACCU-CHEK GUIDE) w/Device KIT 1 each by Does not apply route 2 (two) times daily. 1 kit 0   bumetanide (BUMEX) 2 MG tablet Take 1 tablet by mouth  daily (Patient taking differently: Take 2 tablets in the morning and 1 in the afternoon) 90 tablet 3   Cholecalciferol (VITAMIN D-3) 1000 UNITS CAPS Take 2,000 Units by mouth daily.     cloNIDine (CATAPRES) 0.1 MG tablet Take 1 tablet (0.1 mg total) by mouth 2 (two) times daily. 180 tablet 1   diltiazem (CARDIZEM CD) 360 MG 24 hr capsule TAKE 1 CAPSULE BY MOUTH  DAILY WITH BREAKFAST 90 capsule 1   glucose blood test strip Use to monitor glucose levels twice per day 100 each 12   hydrALAZINE  (APRESOLINE) 100 MG tablet Take 150 mg by mouth 2 (two) times daily. Taking 1 tablet my mouth in the morning and two in the evening.     Insulin Pen Needle 31G X 8 MM MISC Inject insulin two times a day. 200 each 2   isosorbide mononitrate (IMDUR) 30 MG 24 hr tablet      labetalol (NORMODYNE) 200 MG tablet TAKE 1 TABLET BY MOUTH TWO  TIMES DAILY (Patient taking differently: TAKE 1 and a half tablets by mouth in the morining and evening. Take 1 tablet by mouth in the afternoon.) 180 tablet 1   Lancet Devices (AUTOLET IMPRESSION) MISC You to check blood sugar 2 times a day 2 each 1   Multiple Vitamins-Minerals (CENTURY SENIOR) TABS Take 1 tablet by mouth daily.     Omega-3 Fatty Acids (FISH OIL) 1200 MG CAPS Take 1,200 mg by mouth daily.     PARoxetine (PAXIL) 20 MG tablet TAKE 1 TABLET BY MOUTH  DAILY 90 tablet 1   sildenafil (VIAGRA) 100 MG tablet Take 1 tablet (100 mg total) by mouth daily as needed for erectile dysfunction (Single Pack requested. May only buy a few pills.). 10 tablet 5   tadalafil (CIALIS) 20 MG tablet Take 0.5-1 tablets (10-20 mg total) by mouth every other day as needed for erectile dysfunction. 5 tablet 11   telmisartan (MICARDIS) 80 MG tablet Take 1 tablet by mouth once daily 90 tablet 1   No current facility-administered medications on file prior to visit.     Allergies  Allergen Reactions   Amlodipine Swelling   Spironolactone     gynecomastia    Family History  Problem Relation Age of Onset   Heart attack Mother        21 from chemo   Breast cancer Mother        dx. <5; +chemo   Hypertension Father    Cerebral aneurysm Father    BRCA 1/2 Maternal Aunt        never tested, but daughter has known mutation in BRCA2   Kidney failure Maternal Aunt    Stomach cancer Maternal Uncle        d. 51; smoker and issues with heartburn   Heart Problems Paternal Grandfather    BRCA 1/2 Cousin        BRCA2+;  maternal 1st cousin; first person tested  in family   Breast cancer Cousin 56   Colon cancer Neg Hx    Colon polyps Neg Hx    Esophageal cancer Neg Hx    Rectal cancer Neg Hx     BP 110/72    Pulse (!) 57    Ht '5\' 4"'$  (1.626 m)    Wt 227 lb (103 kg)    SpO2 97%    BMI 38.96 kg/m    Review of Systems He denies hypoglycemia sxs.  He has lost 6 lbs since last ov (intentional)    Objective:   Physical Exam VITAL SIGNS:  See vs page GENERAL: no distress' Pulses: dorsalis pedis intact bilat.   MSK: no deformity of the feet CV: trace bilat leg edema.   Skin:  no ulcer on the feet.  normal color and temp on the feet. Neuro: sensation is intact to touch on the feet.    Lab Results  Component Value Date   HGBA1C 7.3 (A) 12/13/2018    Lab Results  Component Value Date   CREATININE 1.73 (H) 06/15/2018   BUN 30 (H) 06/15/2018   NA 143 06/15/2018   K 3.8 06/15/2018   CL 105 06/15/2018   CO2 31 06/15/2018       Assessment & Plan:  Insulin-requiring type 2 DM, with renal insuff: this is the best control this pt should aim for, given this regimen, which does match insulin to her changing needs throughout the day Weight loss: pt is advised to continue his efforts.  Patient Instructions  Please continue the same insulin.  A concentrated form is called "Toujeo."  Please let me know if you want to change.   check your blood sugar twice a day.  vary the time of day when you check, between before the 3 meals, and at bedtime.  also check if you have symptoms of your blood sugar being too high or too low.  please keep a record of the readings and bring it to your next appointment here (or you can bring the meter itself).  You can write it on any piece of paper.  please call us sooner if your blood sugar goes below 70, or if you have a lot of readings over 200. Please come back for a follow-up appointment in 3 months.

## 2018-12-13 NOTE — Patient Instructions (Addendum)
Please continue the same insulin.  A concentrated form is called "Toujeo."  Please let me know if you want to change.   check your blood sugar twice a day.  vary the time of day when you check, between before the 3 meals, and at bedtime.  also check if you have symptoms of your blood sugar being too high or too low.  please keep a record of the readings and bring it to your next appointment here (or you can bring the meter itself).  You can write it on any piece of paper.  please call us sooner if your blood sugar goes below 70, or if you have a lot of readings over 200. Please come back for a follow-up appointment in 3 months.

## 2018-12-15 LAB — SARS CORONAVIRUS 2 (TAT 6-24 HRS): SARS Coronavirus 2: NEGATIVE

## 2018-12-18 ENCOUNTER — Encounter: Payer: Self-pay | Admitting: Gastroenterology

## 2018-12-18 ENCOUNTER — Other Ambulatory Visit: Payer: Self-pay

## 2018-12-18 ENCOUNTER — Ambulatory Visit (AMBULATORY_SURGERY_CENTER): Payer: Medicare Other | Admitting: Gastroenterology

## 2018-12-18 VITALS — BP 114/58 | HR 53 | Temp 97.9°F | Resp 19 | Ht 64.0 in | Wt 233.0 lb

## 2018-12-18 DIAGNOSIS — K649 Unspecified hemorrhoids: Secondary | ICD-10-CM

## 2018-12-18 DIAGNOSIS — D124 Benign neoplasm of descending colon: Secondary | ICD-10-CM

## 2018-12-18 DIAGNOSIS — D123 Benign neoplasm of transverse colon: Secondary | ICD-10-CM | POA: Diagnosis not present

## 2018-12-18 DIAGNOSIS — Z86010 Personal history of colonic polyps: Secondary | ICD-10-CM | POA: Diagnosis not present

## 2018-12-18 DIAGNOSIS — D125 Benign neoplasm of sigmoid colon: Secondary | ICD-10-CM | POA: Diagnosis not present

## 2018-12-18 DIAGNOSIS — K640 First degree hemorrhoids: Secondary | ICD-10-CM

## 2018-12-18 DIAGNOSIS — K5730 Diverticulosis of large intestine without perforation or abscess without bleeding: Secondary | ICD-10-CM

## 2018-12-18 MED ORDER — SODIUM CHLORIDE 0.9 % IV SOLN: 500.0000 mL | Freq: Once | INTRAVENOUS | Status: DC

## 2018-12-18 NOTE — Progress Notes (Signed)
Report given to PACU, vss 

## 2018-12-18 NOTE — Patient Instructions (Signed)
Please read handouts provided. Continue present medications. Repeat colonoscopy in 6-12 months. Use fiber, for example Citrucel, Fibercon, Konsyl, or Metamucil. Await pathology results. Return to GI office as needed.        YOU HAD AN ENDOSCOPIC PROCEDURE TODAY AT [facility]:   Refer to the procedure report that was given to you for any specific questions about what was found during the examination.  If the procedure report does not answer your questions, please call your gastroenterologist to clarify.  If you requested that your care partner not be given the details of your procedure findings, then the procedure report has been included in a sealed envelope for you to review at your convenience later.  YOU SHOULD EXPECT: Some feelings of bloating in the abdomen. Passage of more gas than usual.  Walking can help get rid of the air that was put into your GI tract during the procedure and reduce the bloating. If you had a lower endoscopy (such as a colonoscopy or flexible sigmoidoscopy) you may notice spotting of blood in your stool or on the toilet paper. If you underwent a bowel prep for your procedure, you may not have a normal bowel movement for a few days.  Please Note:  You might notice some irritation and congestion in your nose or some drainage.  This is from the oxygen used during your procedure.  There is no need for concern and it should clear up in a day or so.  SYMPTOMS TO REPORT IMMEDIATELY:   Following lower endoscopy (colonoscopy or flexible sigmoidoscopy):  Excessive amounts of blood in the stool  Significant tenderness or worsening of abdominal pains  Swelling of the abdomen that is new, acute  Fever of 100F or higher    For urgent or emergent issues, a gastroenterologist can be reached at any hour by calling [phone].   DIET:  We do recommend a small meal at first, but then you may proceed to your regular diet.  Drink plenty of fluids but you  should avoid alcoholic beverages for 24 hours.  ACTIVITY:  You should plan to take it easy for the rest of today and you should NOT DRIVE or use heavy machinery until tomorrow (because of the sedation medicines used during the test).    FOLLOW UP: Our staff will call the number listed on your records 48-72 hours following your procedure to check on you and address any questions or concerns that you may have regarding the information given to you following your procedure. If we do not reach you, we will leave a message.  We will attempt to reach you two times.  During this call, we will ask if you have developed any symptoms of COVID 19. If you develop any symptoms (ie: fever, flu-like symptoms, shortness of breath, cough etc.) before then, please call [phone].  If you test positive for Covid 19 in the 2 weeks post procedure, please call and report this information to [place].    If any biopsies were taken you will be contacted by phone or by letter within the next 1-3 weeks.  Please call us at [phone] if you have not heard about the biopsies in 3 weeks.    SIGNATURES/CONFIDENTIALITY: You and/or your care partner have signed paperwork which will be entered into your electronic medical record.  These signatures attest to the fact that that the information above on your After Visit Summary has been reviewed and is understood.  Full responsibility of the  confidentiality of this discharge information lies with you and/or your care-partner. 

## 2018-12-18 NOTE — Progress Notes (Signed)
Called to room to assist during endoscopic procedure.  Patient ID and intended procedure confirmed with present staff. Received instructions for my participation in the procedure from the performing physician.  

## 2018-12-18 NOTE — Progress Notes (Signed)
Pt's states no medical or surgical changes since previsit or office visit. 

## 2018-12-18 NOTE — Op Note (Signed)
[facility] Patient Name: Paul [name] Procedure Date: 12/18/2018 8:30 AM MRN: [id number] Endoscopist: Gerrit Heck , MD Age: 73 Referring MD:  Date of Birth: July 05, 1945 Gender: Male Account #: [id number] Procedure:                Colonoscopy Indications:              Surveillance: Personal history of adenomatous                            polyps on last colonoscopy 5 years ago                           73 yo male presents for ongoing polyp surveillance.                            Last colonoscopy in 2015 with 5 mm tubular adenoma,                            with recommendation to repeat in 5 years. Otherwise                            no lower GI symptoms. Medicines:                Monitored Anesthesia Care Procedure:                Pre-Anesthesia Assessment:                           - Prior to the procedure, a History and Physical                            was performed, and patient medications and                            allergies were reviewed. The patient's tolerance of                            previous anesthesia was also reviewed. The risks                            and benefits of the procedure and the sedation                            options and risks were discussed with the patient.                            All questions were answered, and informed consent                            was obtained. Prior Anticoagulants: The patient has                            taken no previous anticoagulant or antiplatelet  agents. ASA Grade Assessment: III - A patient with                            severe systemic disease. After reviewing the risks                            and benefits, the patient was deemed in                            satisfactory condition to undergo the procedure.                           After obtaining informed consent, the colonoscope                            was passed under direct vision. Throughout  the                            procedure, the patient's blood pressure, pulse, and                            oxygen saturations were monitored continuously. The                            Colonoscope was introduced through the anus and                            advanced to the the cecum, identified by                            appendiceal orifice and ileocecal valve. The                            colonoscopy was performed without difficulty. The                            patient tolerated the procedure well. The quality                            of the bowel preparation was fair. The ileocecal                            valve, appendiceal orifice, and rectum were                            photographed. Scope In: 8:39:06 AM Scope Out: 9:05:57 AM Scope Withdrawal Time: 0 hours 22 minutes 12 seconds  Total Procedure Duration: 0 hours 26 minutes 51 seconds  Findings:                 The perianal and digital rectal examinations were                            normal.  Two sessile polyps were found in the descending                            colon and transverse colon. The polyps were 4 to 6                            mm in size. These polyps were removed with a cold                            snare. Resection and retrieval were complete.                            Estimated blood loss was minimal.                           A 12 mm polyp was found in the sigmoid colon. The                            polyp was pedunculated. The polyp was removed with                            a hot snare. Resection and retrieval were complete.                            Estimated blood loss: none.                           A 2 mm polyp was found in the sigmoid colon. The                            polyp was sessile. The polyp was removed with a                            cold biopsy forceps. Resection and retrieval were                            complete. Estimated blood loss  was minimal.                           Many small and large-mouthed diverticula were found                            in the sigmoid colon.                           Non-bleeding internal hemorrhoids were found during                            retroflexion. The hemorrhoids were medium-sized.                           Non-bleeding rectal varices were found.  A moderate amount of semi-liquid stool was found in                            the entire colon, interfering with visualization.                            Lavage of the area was performed using copious                            amounts of sterile water, resulting in clearance                            with fair visualization.                           The sigmoid colon was moderately tortuous. External                            pressure was applied to the patient's abdomen in                            order to accomplish the maneuver. Complications:            No immediate complications. Estimated Blood Loss:     Estimated blood loss was minimal. Impression:               - Preparation of the colon was fair.                           - Two 4 to 6 mm polyps in the descending colon and                            in the transverse colon, removed with a cold snare.                            Resected and retrieved.                           - One 12 mm polyp in the sigmoid colon, removed                            with a hot snare. Resected and retrieved.                           - One 2 mm polyp in the sigmoid colon, removed with                            a cold biopsy forceps. Resected and retrieved.                           - Diverticulosis in the sigmoid colon.                           - Non-bleeding internal hemorrhoids.                           -  Rectal varices.                           - Stool in the entire examined colon.                           - Tortuous colon. Recommendation:           -  Patient has a contact number available for                            emergencies. The signs and symptoms of potential                            delayed complications were discussed with the                            patient. Return to normal activities tomorrow.                            Written discharge instructions were provided to the                            patient.                           - Resume previous diet.                           - Continue present medications.                           - Await pathology results.                           - Repeat colonoscopy in 6-12 months because the                            bowel preparation was suboptimal and for                            surveillance.                           - Return to GI office PRN.                           - Use fiber, for example Citrucel, Fibercon, Konsyl                            or Metamucil. Gerrit Heck, MD 12/18/2018 9:13:17 AM

## 2018-12-18 NOTE — Progress Notes (Signed)
Pt's states no medical or surgical changes since previsit or office visit.  Temp taken by JB VS taken by CW 

## 2018-12-19 ENCOUNTER — Telehealth: Payer: Self-pay | Admitting: Gastroenterology

## 2018-12-19 NOTE — Telephone Encounter (Signed)
Agree with recommendation for trial of Gas-X OTC.  Thank you.

## 2018-12-19 NOTE — Telephone Encounter (Signed)
Pt. Called back stated he is still having gas pain on right side of abdomen", rates pain level at 6 also stated" when I inhale it radiates to shoulder",he wanted to ask if he could buy some otc gas-x,told him yes but I will still have to inform his doctor about pain and if he recommends anything different I would let him know ,he stated "ok".

## 2018-12-19 NOTE — Telephone Encounter (Signed)
Placed call to pt. Message left.

## 2018-12-19 NOTE — Telephone Encounter (Signed)
Attempted to call again,another message left with call back #.

## 2018-12-19 NOTE — Telephone Encounter (Signed)
Pt had a colonoscopy yesterday and reported that he is experiencing abdominal pain.  Please advise.

## 2018-12-19 NOTE — Telephone Encounter (Signed)
Informed pt. That doctor agree with  trying the gas-x ,also instructed pt.  if he has worsening pain to please  call back,pt. Verbalize understanding.

## 2018-12-20 ENCOUNTER — Telehealth: Payer: Self-pay

## 2018-12-20 NOTE — Telephone Encounter (Signed)
First post procedure follow up call, no answer 

## 2018-12-21 ENCOUNTER — Telehealth: Payer: Self-pay | Admitting: Gastroenterology

## 2018-12-21 NOTE — Telephone Encounter (Signed)
RN reviewed previous message from 12/19/2018- please advise next step in plan of care

## 2018-12-22 NOTE — Telephone Encounter (Signed)
Pt reported that he has had abd pain all week post colonoscopy.  Please advise.

## 2018-12-22 NOTE — Telephone Encounter (Signed)
I called patient to discuss his symptoms.  Having right sided abdominal pain, located in the right mid abdomen.  Pain has somewhat improved through the week, but still present.  Thought he may have had some abdominal swelling earlier in the week, but this seems to have subsided.  Passing flatus, but no BM since the procedure.  Reduced appetite, but no nausea or vomiting.  No fevers or chills.  No hematochezia.  Pain can be associated with some movement, but motion does not elicit pain always.  Some improvement with trial of Gas-X, but pain still lingers.  Otherwise speaking in full sentences, mentating clearly, no c/o CP, SOB.   Reviewed his colonoscopy report with him, which was notable for a tortuous colon requiring external abdominal pressure to reach the cecum and fair prep.  A few polyps were resected, including a 12 mm pedunculated sigmoid polyp resected with hot snare. Discussed the DDx for post colonoscopy pain, to include ecchymosis from tortuous colon/external abdominal pressure, post polypectomy pain syndrome, perforation, etc.  Do not favor the latter, as the pain seems to be improving, and lack of additional associated signs/symptoms, but did offer expedited CT scan to rule this out (he politely declined).  Discussed evaluation and treatment options, to include CT scan, trial of NSAIDs, and he opted for the latter.  Will trial course of NSAIDs, but gave caution that should he develop fever, chills, increasing pain, p.o. intolerance, abdominal distention, or any other concerns, to either call me, or if after hours/weekend, to go to the ER for evaluation and likely CT.  He understands, and is appreciative of the phone call in discussion today.

## 2018-12-28 ENCOUNTER — Encounter: Payer: Self-pay | Admitting: Gastroenterology

## 2019-01-21 ENCOUNTER — Other Ambulatory Visit: Payer: Self-pay | Admitting: Family Medicine

## 2019-02-04 ENCOUNTER — Other Ambulatory Visit: Payer: Self-pay | Admitting: Family Medicine

## 2019-02-22 ENCOUNTER — Inpatient Hospital Stay (HOSPITAL_COMMUNITY)
Admission: EM | Admit: 2019-02-22 | Discharge: 2019-03-19 | DRG: 207 | Disposition: E | Payer: Medicare Other | Attending: Critical Care Medicine | Admitting: Critical Care Medicine

## 2019-02-22 ENCOUNTER — Encounter: Payer: Self-pay | Admitting: Family Medicine

## 2019-02-22 ENCOUNTER — Encounter (HOSPITAL_COMMUNITY): Payer: Self-pay | Admitting: Emergency Medicine

## 2019-02-22 ENCOUNTER — Telehealth: Payer: Self-pay | Admitting: Family Medicine

## 2019-02-22 ENCOUNTER — Other Ambulatory Visit: Payer: Medicare Other

## 2019-02-22 ENCOUNTER — Other Ambulatory Visit: Payer: Self-pay

## 2019-02-22 ENCOUNTER — Emergency Department (HOSPITAL_COMMUNITY): Payer: Medicare Other

## 2019-02-22 ENCOUNTER — Ambulatory Visit (INDEPENDENT_AMBULATORY_CARE_PROVIDER_SITE_OTHER): Payer: Medicare Other | Admitting: Family Medicine

## 2019-02-22 VITALS — BP 161/88 | Temp 102.4°F | Ht 64.0 in | Wt 225.0 lb

## 2019-02-22 DIAGNOSIS — I10 Essential (primary) hypertension: Secondary | ICD-10-CM

## 2019-02-22 DIAGNOSIS — Z888 Allergy status to other drugs, medicaments and biological substances status: Secondary | ICD-10-CM

## 2019-02-22 DIAGNOSIS — J1282 Pneumonia due to coronavirus disease 2019: Secondary | ICD-10-CM | POA: Diagnosis present

## 2019-02-22 DIAGNOSIS — Z01818 Encounter for other preprocedural examination: Secondary | ICD-10-CM

## 2019-02-22 DIAGNOSIS — E1169 Type 2 diabetes mellitus with other specified complication: Secondary | ICD-10-CM | POA: Diagnosis present

## 2019-02-22 DIAGNOSIS — M791 Myalgia, unspecified site: Secondary | ICD-10-CM | POA: Diagnosis present

## 2019-02-22 DIAGNOSIS — Z9911 Dependence on respirator [ventilator] status: Secondary | ICD-10-CM | POA: Diagnosis not present

## 2019-02-22 DIAGNOSIS — Z8249 Family history of ischemic heart disease and other diseases of the circulatory system: Secondary | ICD-10-CM

## 2019-02-22 DIAGNOSIS — Z79899 Other long term (current) drug therapy: Secondary | ICD-10-CM

## 2019-02-22 DIAGNOSIS — U071 COVID-19: Secondary | ICD-10-CM

## 2019-02-22 DIAGNOSIS — E11319 Type 2 diabetes mellitus with unspecified diabetic retinopathy without macular edema: Secondary | ICD-10-CM | POA: Diagnosis present

## 2019-02-22 DIAGNOSIS — J982 Interstitial emphysema: Secondary | ICD-10-CM | POA: Diagnosis present

## 2019-02-22 DIAGNOSIS — J9600 Acute respiratory failure, unspecified whether with hypoxia or hypercapnia: Secondary | ICD-10-CM

## 2019-02-22 DIAGNOSIS — E669 Obesity, unspecified: Secondary | ICD-10-CM | POA: Diagnosis present

## 2019-02-22 DIAGNOSIS — I5032 Chronic diastolic (congestive) heart failure: Secondary | ICD-10-CM | POA: Diagnosis present

## 2019-02-22 DIAGNOSIS — E875 Hyperkalemia: Secondary | ICD-10-CM | POA: Diagnosis not present

## 2019-02-22 DIAGNOSIS — E860 Dehydration: Secondary | ICD-10-CM | POA: Diagnosis present

## 2019-02-22 DIAGNOSIS — J939 Pneumothorax, unspecified: Secondary | ICD-10-CM

## 2019-02-22 DIAGNOSIS — E1165 Type 2 diabetes mellitus with hyperglycemia: Secondary | ICD-10-CM | POA: Diagnosis present

## 2019-02-22 DIAGNOSIS — E1121 Type 2 diabetes mellitus with diabetic nephropathy: Secondary | ICD-10-CM | POA: Diagnosis not present

## 2019-02-22 DIAGNOSIS — J9382 Other air leak: Secondary | ICD-10-CM | POA: Diagnosis not present

## 2019-02-22 DIAGNOSIS — G4733 Obstructive sleep apnea (adult) (pediatric): Secondary | ICD-10-CM | POA: Diagnosis present

## 2019-02-22 DIAGNOSIS — G9341 Metabolic encephalopathy: Secondary | ICD-10-CM | POA: Diagnosis present

## 2019-02-22 DIAGNOSIS — J9311 Primary spontaneous pneumothorax: Secondary | ICD-10-CM | POA: Diagnosis present

## 2019-02-22 DIAGNOSIS — Z794 Long term (current) use of insulin: Secondary | ICD-10-CM | POA: Diagnosis not present

## 2019-02-22 DIAGNOSIS — F329 Major depressive disorder, single episode, unspecified: Secondary | ICD-10-CM | POA: Diagnosis present

## 2019-02-22 DIAGNOSIS — K5790 Diverticulosis of intestine, part unspecified, without perforation or abscess without bleeding: Secondary | ICD-10-CM | POA: Diagnosis present

## 2019-02-22 DIAGNOSIS — Z85820 Personal history of malignant melanoma of skin: Secondary | ICD-10-CM

## 2019-02-22 DIAGNOSIS — K5900 Constipation, unspecified: Secondary | ICD-10-CM

## 2019-02-22 DIAGNOSIS — E1159 Type 2 diabetes mellitus with other circulatory complications: Secondary | ICD-10-CM | POA: Diagnosis present

## 2019-02-22 DIAGNOSIS — I130 Hypertensive heart and chronic kidney disease with heart failure and stage 1 through stage 4 chronic kidney disease, or unspecified chronic kidney disease: Secondary | ICD-10-CM | POA: Diagnosis present

## 2019-02-22 DIAGNOSIS — J9601 Acute respiratory failure with hypoxia: Secondary | ICD-10-CM | POA: Diagnosis present

## 2019-02-22 DIAGNOSIS — Z4682 Encounter for fitting and adjustment of non-vascular catheter: Secondary | ICD-10-CM

## 2019-02-22 DIAGNOSIS — I152 Hypertension secondary to endocrine disorders: Secondary | ICD-10-CM | POA: Diagnosis present

## 2019-02-22 DIAGNOSIS — E785 Hyperlipidemia, unspecified: Secondary | ICD-10-CM | POA: Diagnosis present

## 2019-02-22 DIAGNOSIS — Z9689 Presence of other specified functional implants: Secondary | ICD-10-CM

## 2019-02-22 DIAGNOSIS — Z800 Family history of malignant neoplasm of digestive organs: Secondary | ICD-10-CM

## 2019-02-22 DIAGNOSIS — N179 Acute kidney failure, unspecified: Secondary | ICD-10-CM | POA: Diagnosis present

## 2019-02-22 DIAGNOSIS — R0602 Shortness of breath: Secondary | ICD-10-CM

## 2019-02-22 DIAGNOSIS — J80 Acute respiratory distress syndrome: Secondary | ICD-10-CM

## 2019-02-22 DIAGNOSIS — Z6838 Body mass index (BMI) 38.0-38.9, adult: Secondary | ICD-10-CM

## 2019-02-22 DIAGNOSIS — R509 Fever, unspecified: Secondary | ICD-10-CM | POA: Diagnosis not present

## 2019-02-22 DIAGNOSIS — E1129 Type 2 diabetes mellitus with other diabetic kidney complication: Secondary | ICD-10-CM | POA: Diagnosis present

## 2019-02-22 DIAGNOSIS — N183 Chronic kidney disease, stage 3 unspecified: Secondary | ICD-10-CM | POA: Diagnosis present

## 2019-02-22 DIAGNOSIS — I959 Hypotension, unspecified: Secondary | ICD-10-CM | POA: Diagnosis not present

## 2019-02-22 DIAGNOSIS — Z66 Do not resuscitate: Secondary | ICD-10-CM | POA: Diagnosis present

## 2019-02-22 DIAGNOSIS — Z515 Encounter for palliative care: Secondary | ICD-10-CM | POA: Diagnosis not present

## 2019-02-22 DIAGNOSIS — Z4659 Encounter for fitting and adjustment of other gastrointestinal appliance and device: Secondary | ICD-10-CM

## 2019-02-22 DIAGNOSIS — Z8719 Personal history of other diseases of the digestive system: Secondary | ICD-10-CM

## 2019-02-22 DIAGNOSIS — Z87891 Personal history of nicotine dependence: Secondary | ICD-10-CM

## 2019-02-22 DIAGNOSIS — Z803 Family history of malignant neoplasm of breast: Secondary | ICD-10-CM

## 2019-02-22 DIAGNOSIS — I4510 Unspecified right bundle-branch block: Secondary | ICD-10-CM | POA: Diagnosis present

## 2019-02-22 DIAGNOSIS — Z7189 Other specified counseling: Secondary | ICD-10-CM | POA: Diagnosis not present

## 2019-02-22 DIAGNOSIS — N1831 Chronic kidney disease, stage 3a: Secondary | ICD-10-CM | POA: Diagnosis not present

## 2019-02-22 DIAGNOSIS — E1122 Type 2 diabetes mellitus with diabetic chronic kidney disease: Secondary | ICD-10-CM

## 2019-02-22 LAB — COMPREHENSIVE METABOLIC PANEL
ALT: 61 U/L — ABNORMAL HIGH (ref 0–44)
AST: 78 U/L — ABNORMAL HIGH (ref 15–41)
Albumin: 3.4 g/dL — ABNORMAL LOW (ref 3.5–5.0)
Alkaline Phosphatase: 84 U/L (ref 38–126)
Anion gap: 13 (ref 5–15)
BUN: 31 mg/dL — ABNORMAL HIGH (ref 8–23)
CO2: 27 mmol/L (ref 22–32)
Calcium: 8.9 mg/dL (ref 8.9–10.3)
Chloride: 98 mmol/L (ref 98–111)
Creatinine, Ser: 2.3 mg/dL — ABNORMAL HIGH (ref 0.61–1.24)
GFR calc Af Amer: 31 mL/min — ABNORMAL LOW (ref >60)
GFR calc non Af Amer: 27 mL/min — ABNORMAL LOW (ref >60)
Glucose, Bld: 110 mg/dL — ABNORMAL HIGH (ref 70–99)
Potassium: 3.6 mmol/L (ref 3.5–5.1)
Sodium: 138 mmol/L (ref 135–145)
Total Bilirubin: 0.8 mg/dL (ref 0.3–1.2)
Total Protein: 7 g/dL (ref 6.5–8.1)

## 2019-02-22 LAB — C-REACTIVE PROTEIN: CRP: 1.7 mg/dL — ABNORMAL HIGH (ref <1.0)

## 2019-02-22 LAB — CBC WITH DIFFERENTIAL/PLATELET
Abs Immature Granulocytes: 0.02 10*3/uL (ref 0.00–0.07)
Basophils Absolute: 0 10*3/uL (ref 0.0–0.1)
Basophils Relative: 0 %
Eosinophils Absolute: 0 10*3/uL (ref 0.0–0.5)
Eosinophils Relative: 0 %
HCT: 44.1 % (ref 39.0–52.0)
Hemoglobin: 14.2 g/dL (ref 13.0–17.0)
Immature Granulocytes: 0 %
Lymphocytes Relative: 19 %
Lymphs Abs: 0.9 10*3/uL (ref 0.7–4.0)
MCH: 29.7 pg (ref 26.0–34.0)
MCHC: 32.2 g/dL (ref 30.0–36.0)
MCV: 92.3 fL (ref 80.0–100.0)
Monocytes Absolute: 0.5 10*3/uL (ref 0.1–1.0)
Monocytes Relative: 10 %
Neutro Abs: 3.3 10*3/uL (ref 1.7–7.7)
Neutrophils Relative %: 71 %
Platelets: 155 10*3/uL (ref 150–400)
RBC: 4.78 MIL/uL (ref 4.22–5.81)
RDW: 14.2 % (ref 11.5–15.5)
WBC: 4.7 10*3/uL (ref 4.0–10.5)
nRBC: 0 % (ref 0.0–0.2)

## 2019-02-22 LAB — LACTIC ACID, PLASMA: Lactic Acid, Venous: 1.9 mmol/L (ref 0.5–1.9)

## 2019-02-22 LAB — FERRITIN: Ferritin: 510 ng/mL — ABNORMAL HIGH (ref 24–336)

## 2019-02-22 LAB — ABO/RH: ABO/RH(D): A POS

## 2019-02-22 LAB — RESPIRATORY PANEL BY RT PCR (FLU A&B, COVID)
Influenza A by PCR: NEGATIVE
Influenza B by PCR: NEGATIVE
SARS Coronavirus 2 by RT PCR: POSITIVE — AB

## 2019-02-22 LAB — PROCALCITONIN: Procalcitonin: <0.1 ng/mL

## 2019-02-22 LAB — TRIGLYCERIDES: Triglycerides: 151 mg/dL — ABNORMAL HIGH (ref <150)

## 2019-02-22 LAB — LACTATE DEHYDROGENASE: LDH: 315 U/L — ABNORMAL HIGH (ref 98–192)

## 2019-02-22 LAB — D-DIMER, QUANTITATIVE: D-Dimer, Quant: 1.04 ug/mL-FEU — ABNORMAL HIGH (ref 0.00–0.50)

## 2019-02-22 LAB — FIBRINOGEN: Fibrinogen: 616 mg/dL — ABNORMAL HIGH (ref 210–475)

## 2019-02-22 LAB — CBG MONITORING, ED: Glucose-Capillary: 108 mg/dL — ABNORMAL HIGH (ref 70–99)

## 2019-02-22 LAB — GLUCOSE, CAPILLARY: Glucose-Capillary: 212 mg/dL — ABNORMAL HIGH (ref 70–99)

## 2019-02-22 MED ORDER — SODIUM CHLORIDE 0.9 % IV SOLN: INTRAVENOUS | Status: DC

## 2019-02-22 MED ORDER — SODIUM CHLORIDE 0.9 % IV SOLN: 200.0000 mg | Freq: Once | INTRAVENOUS | Status: DC

## 2019-02-22 MED ORDER — LINAGLIPTIN 5 MG PO TABS
5.0000 mg | ORAL_TABLET | Freq: Every day | ORAL | Status: DC
  Administered 2019-02-22 – 2019-02-24 (×3): 5 mg via ORAL
  Filled 2019-02-22 (×3): qty 1

## 2019-02-22 MED ORDER — SODIUM CHLORIDE 0.9 % IV SOLN
200.0000 mg | Freq: Once | INTRAVENOUS | Status: AC
  Administered 2019-02-22: 200 mg via INTRAVENOUS
  Filled 2019-02-22: qty 40

## 2019-02-22 MED ORDER — ALBUTEROL SULFATE HFA 108 (90 BASE) MCG/ACT IN AERS
2.0000 | INHALATION_SPRAY | Freq: Four times a day (QID) | RESPIRATORY_TRACT | Status: DC
  Administered 2019-02-22 – 2019-02-23 (×2): 2 via RESPIRATORY_TRACT
  Filled 2019-02-22: qty 6.7

## 2019-02-22 MED ORDER — ONDANSETRON HCL 4 MG/2ML IJ SOLN: 4.0000 mg | Freq: Four times a day (QID) | INTRAMUSCULAR | Status: DC | PRN

## 2019-02-22 MED ORDER — ACETAMINOPHEN 500 MG PO TABS
1000.0000 mg | ORAL_TABLET | Freq: Once | ORAL | Status: AC
  Administered 2019-02-22: 1000 mg via ORAL
  Filled 2019-02-22: qty 2

## 2019-02-22 MED ORDER — INSULIN GLARGINE 100 UNIT/ML ~~LOC~~ SOLN
105.0000 [IU] | Freq: Every day | SUBCUTANEOUS | Status: DC
  Administered 2019-02-23: 105 [IU] via SUBCUTANEOUS
  Filled 2019-02-22: qty 1.05

## 2019-02-22 MED ORDER — METHYLPREDNISOLONE SODIUM SUCC 125 MG IJ SOLR
0.5000 mg/kg | Freq: Two times a day (BID) | INTRAMUSCULAR | Status: DC
  Administered 2019-02-22 – 2019-02-23 (×2): 51.25 mg via INTRAVENOUS
  Filled 2019-02-22 (×2): qty 2

## 2019-02-22 MED ORDER — INSULIN ASPART 100 UNIT/ML ~~LOC~~ SOLN
0.0000 [IU] | Freq: Three times a day (TID) | SUBCUTANEOUS | Status: DC
  Administered 2019-02-23: 15 [IU] via SUBCUTANEOUS
  Administered 2019-02-23: 4 [IU] via SUBCUTANEOUS
  Administered 2019-02-23: 15 [IU] via SUBCUTANEOUS
  Administered 2019-02-24: 7 [IU] via SUBCUTANEOUS
  Administered 2019-02-24 (×2): 11 [IU] via SUBCUTANEOUS

## 2019-02-22 MED ORDER — PAROXETINE HCL 20 MG PO TABS
20.0000 mg | ORAL_TABLET | Freq: Every day | ORAL | Status: DC
  Administered 2019-02-23 – 2019-02-24 (×2): 20 mg via ORAL
  Filled 2019-02-22 (×3): qty 1

## 2019-02-22 MED ORDER — DEXAMETHASONE SODIUM PHOSPHATE 10 MG/ML IJ SOLN
10.0000 mg | Freq: Once | INTRAMUSCULAR | Status: AC
  Administered 2019-02-22: 10 mg via INTRAVENOUS
  Filled 2019-02-22: qty 1

## 2019-02-22 MED ORDER — SODIUM CHLORIDE 0.9 % IV SOLN
100.0000 mg | Freq: Every day | INTRAVENOUS | Status: AC
  Administered 2019-02-23 – 2019-02-26 (×4): 100 mg via INTRAVENOUS
  Filled 2019-02-22 (×4): qty 20

## 2019-02-22 MED ORDER — ATORVASTATIN CALCIUM 10 MG PO TABS
20.0000 mg | ORAL_TABLET | Freq: Every day | ORAL | Status: DC
  Administered 2019-02-23 – 2019-02-24 (×2): 20 mg via ORAL
  Filled 2019-02-22 (×2): qty 2

## 2019-02-22 MED ORDER — ONDANSETRON HCL 4 MG PO TABS: 4.0000 mg | ORAL_TABLET | Freq: Four times a day (QID) | ORAL | Status: DC | PRN

## 2019-02-22 MED ORDER — SODIUM CHLORIDE 0.9 % IV SOLN: 100.0000 mg | Freq: Every day | INTRAVENOUS | Status: DC

## 2019-02-22 MED ORDER — ACETAMINOPHEN 325 MG PO TABS: 650.0000 mg | ORAL_TABLET | Freq: Four times a day (QID) | ORAL | Status: DC | PRN

## 2019-02-22 MED ORDER — SODIUM CHLORIDE 0.9 % IV BOLUS: 1000.0000 mL | Freq: Once | INTRAVENOUS | Status: DC

## 2019-02-22 MED ORDER — INSULIN ASPART 100 UNIT/ML ~~LOC~~ SOLN
0.0000 [IU] | Freq: Every day | SUBCUTANEOUS | Status: DC
  Administered 2019-02-22: 2 [IU] via SUBCUTANEOUS
  Administered 2019-02-23: 3 [IU] via SUBCUTANEOUS

## 2019-02-22 MED ORDER — HEPARIN SODIUM (PORCINE) 5000 UNIT/ML IJ SOLN
5000.0000 [IU] | Freq: Three times a day (TID) | INTRAMUSCULAR | Status: DC
  Administered 2019-02-22 – 2019-02-24 (×5): 5000 [IU] via SUBCUTANEOUS
  Filled 2019-02-22 (×5): qty 1

## 2019-02-22 NOTE — Telephone Encounter (Signed)
Picking up pulse ox from curbside right now-they will let me know results  I was concerned that difficulty opening eyes and confusion was an acute change from his visit-they report he just has not completely been himself for the last 2 days and seems very tired.  They are interested in outpatient antibody infusion treatment-I already [facility].  Team-please get patient set up for remote home monitoring program-patient is interested

## 2019-02-22 NOTE — Plan of Care (Signed)
  Problem: Education: Goal: Knowledge of risk factors and measures for prevention of condition will improve Outcome: Progressing   Problem: Coping: Goal: Psychosocial and spiritual needs will be supported Outcome: Progressing   Problem: Respiratory: Goal: Will maintain a patent airway Outcome: Progressing Goal: Complications related to the disease process, condition or treatment will be avoided or minimized Outcome: Progressing   

## 2019-02-22 NOTE — ED Notes (Signed)
Dinner Tray Ordered @ 1819. 

## 2019-02-22 NOTE — Progress Notes (Addendum)
Phone [phone] Virtual visit via Video note   Subjective:  Chief complaint: Chief Complaint  Patient presents with  . virtual  . Fever    This visit type was conducted due to national recommendations for restrictions regarding the COVID-19 Pandemic (e.g. social distancing).  This format is felt to be most appropriate for this patient at this time balancing risks to patient and risks to population by having him in for in person visit.  No physical exam was performed (except for noted visual exam or audio findings with Telehealth visits).    Our team/I connected with Kara Mead at  9:20 AM EST by a video enabled telemedicine application (doxy.me or caregility through epic) and verified that I am speaking with the correct person using two identifiers.  Location patient: [place] Location provider: [facility], office Persons participating in the virtual visit:  patient  Our team/I discussed the limitations of evaluation and management by telemedicine and the availability of in person appointments. In light of current covid-19 pandemic, patient also understands that we are trying to protect them by minimizing in office contact if at all possible.  The patient expressed consent for telemedicine visit and agreed to proceed. Patient understands insurance will be billed.   Past Medical History-  Patient Active Problem List   Diagnosis Date Noted  . 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 Chronic diastolic heart failure ([place]) 11/26/2013    Priority: High  . History of melanoma 12/18/2012    Priority: High  . Type 2 diabetes, controlled, with renal manifestation ([place]) 07/28/2006    Priority: High  . Hypertension associated with diabetes ([place]) 07/28/2006    Priority: High  . CKD (chronic kidney disease), stage III ([place]) 09/19/2014    Priority: Medium  . Obstructive sleep apnea 03/27/2014    Priority: Medium  . Former smoker 01/02/2014    Priority: Medium  .  Moderate aortic stenosis 11/26/2013    Priority: Medium  . Hyperlipidemia 07/28/2006    Priority: Medium  . Major depression in full remission ([place]) 07/28/2006    Priority: Medium  . BRCA2 positive 02/06/2015    Priority: Low  . Genetic testing 02/06/2015    Priority: Low  . Diverticulosis of colon with hemorrhage 08/10/2013    Priority: Low  . Hx of adenomatous colonic polyps 08/10/2013    Priority: Low  . Erectile dysfunction 09/04/2009    Priority: Low  . Morbid obesity ([place]) 12/28/2007    Priority: Low  . Gout 06/28/2007    Priority: Low  . Anemia 12/27/2006    Priority: Low  . MICROALBUMINURIA 07/28/2006    Priority: Low  . Diabetic retinopathy associated with controlled type 2 diabetes mellitus ([place]) 06/26/2018    Medications- reviewed and updated Current Outpatient Medications  Medication Sig Dispense Refill  . atorvastatin (LIPITOR) 20 MG tablet TAKE 1 TABLET BY MOUTH  DAILY AT 6PM 90 tablet 1  . Blood Glucose Monitoring Suppl (ACCU-CHEK GUIDE) w/Device KIT 1 each by Does not apply route 2 (two) times daily. 1 kit 0  . bumetanide (BUMEX) 2 MG tablet Take 1 tablet by mouth  daily (Patient taking differently: Take 2 tablets in the morning and 1 in the afternoon) 90 tablet 3  . Cholecalciferol (VITAMIN D-3) 1000 UNITS CAPS Take 2,000 Units by mouth daily.    . cloNIDine (CATAPRES) 0.1 MG tablet Take 1 tablet (0.1 mg total) by mouth 2 (two) times daily. 180 tablet 1  . diltiazem (CARDIZEM  CD) 360 MG 24 hr capsule TAKE 1 CAPSULE BY MOUTH  DAILY WITH BREAKFAST 90 capsule 3  . glucose blood test strip Use to monitor glucose levels twice per day 100 each 12  . hydrALAZINE (APRESOLINE) 100 MG tablet Take 150 mg by mouth 2 (two) times daily. Taking 1 tablet my mouth in the morning and two in the evening.    . Insulin Glargine (LANTUS SOLOSTAR) 100 UNIT/ML Solostar Pen Inject 105 Units into the skin every morning. 40 pen 3  . Insulin Pen Needle 31G X 8 MM MISC Inject insulin two  times a day. 200 each 2  . isosorbide mononitrate (IMDUR) 30 MG 24 hr tablet     . labetalol (NORMODYNE) 200 MG tablet TAKE 1 TABLET BY MOUTH TWO  TIMES DAILY (Patient taking differently: TAKE 1 and a half tablets by mouth in the morining and evening. Take 1 tablet by mouth in the afternoon.) 180 tablet 1  . Lancet Devices (AUTOLET IMPRESSION) MISC You to check blood sugar 2 times a day 2 each 1  . Multiple Vitamins-Minerals (CENTURY SENIOR) TABS Take 1 tablet by mouth daily.    . Omega-3 Fatty Acids (FISH OIL) 1200 MG CAPS Take 1,200 mg by mouth daily.    Marland Kitchen PARoxetine (PAXIL) 20 MG tablet TAKE 1 TABLET BY MOUTH  DAILY 90 tablet 3  . tadalafil (CIALIS) 20 MG tablet Take 0.5-1 tablets (10-20 mg total) by mouth every other day as needed for erectile dysfunction. 5 tablet 11  . telmisartan (MICARDIS) 80 MG tablet Take 1 tablet by mouth once daily 90 tablet 1  . ACCU-CHEK FASTCLIX LANCETS MISC Use to check blood sugar 2 times per day 200 each 2   No current facility-administered medications for this visit.     Objective:  BP (!) 161/88   Temp (!) 102.4 F (39.1 C)   Ht '5\' 4"'$  (1.626 m)   Wt 225 lb (102.1 kg)   BMI 38.62 kg/m  self reported vitals Gen: NAD, resting comfortably Lungs: nonlabored, normal respiratory rate  Skin: appears dry, no obvious rash     Assessment and Plan   Fever S: pt c/o fever 102.4 that started on Wednesday. Was at gym on Tuesday. Also has chills. Some fatigue/sore throat/diarrhea.  Dry nose.  Pt has not gotten covid tested. He also c/o chest congestion and mild productive cough but no nasal congestion- trying robitussen. Pt denies loss of taste of smell and body aches and no chills. Pt appetite is not to good. Pt states son in law had covid and was around him a couple of weeks ago-apparently 2 other family members currently feels slightly ill as well and there is a chance he has had Covid contact. CBG 134.  ROS- No fever, chills, cough, congestion, runny nose,  shortness of breath, body aches,  headache, nausea, vomiting, or new loss of taste or smell. No known contacts with covid 19 or someone being tested for covid 19.  A/P: 74 year old male with fever and chest congestion.  Offered empiric antibiotics in case this is a bacterial pneumonia-likely doxycycline 100 mg twice a day.  They would like to get tested for COVID-19 first as below.  If he has worsening symptoms he will certainly let us know.  They are going to try to get a rapid today or tomorrow and also do PCR testing.  Patient with symptoms concerning for potential covid 19-2 family members also sick-they have not been tested yet for COVID-19 Therefore: -information provided  on testing scheduling "please text "COVID" to [phone], OR you can log on to [email or web address] to easily make an on-line appointment. "  - also discussed some rapid testing options - recommended patient watch closely for shortness of breath or confusion or worsening symptoms and if those occur he should contact us immediately  -recommended patient consider purchasing pulse oximeter and if levels 94% or below persistently- seek care at the hospital  -recommended self isolation until negative test  at minimum. Also discussed potential for false negatives and to still be cautious even with negative test  - for quarantine if covid 19 test positive would need to be at least 10 days since first symptom AND at least 24 hours fever free without fever reducing medications AND improvement in respiratory symptoms  - we also discussed close contacts would need 14 day quarantine after last close contact with patient IF patient is positive  -They are potentially interested in outpatient antibody infusion  Addendum after visit-patient tested positive on rapid COVID-19 testing.  He picked up pulse oximetry and oxygen levels were in the upper 80s.  I discussed case with patient's daughter and recommended proceeding to the emergency room  immediately-they were compliant with this and patient is currently hospitalized as a result.  #hypertension S: Typically compliant with clonidine 0.1 mg twice a day, Cardizem to 60 mg extended release daily, hydralazine 100 mg in the morning 200 mg in the evening, Imdur 30 mg, labetalol 200 mg twice daily, telmisartan 80 mg.  He admits he forgot to take doses within last 16 hours or so BP Readings from Last 3 Encounters:  02/27/2019 (!) 161/88  12/18/18 (!) 114/58  12/13/18 110/72  A/P: Poor control today-encouraged him to take his blood pressure medicine as soon as possible-recheck at follow-up-suspect it will trend back down.  Continue current medications for now   Recommended follow up: As needed for acute concern Future Appointments  Date Time Provider [place]  03/21/2019 10:30 AM Renato Shin, MD [id number] None    Lab/Order associations:   ICD-10-CM   1. Fever, unspecified fever cause  R50.9   2. Hypertension associated with diabetes ([place])  E11.59    I10    Time Spent: 30 minutes of total time (9:20- 9: 40 PM, 12:15-12:25 for follow up phone calls and documenation) was spent on the date of the encounter performing the following actions: chart review prior to seeing the patient, obtaining history, performing a medically necessary exam, counseling on the treatment plan, placing orders, and documenting in our EHR.   Return precautions advised.  Garret Reddish, MD

## 2019-02-22 NOTE — Addendum Note (Signed)
Addended by: Marin Olp on: 03/03/2019 08:38 PM   Modules accepted: Level of Service

## 2019-02-22 NOTE — ED Provider Notes (Signed)
[facility] EMERGENCY DEPARTMENT Provider Note   CSN: [id number] Arrival date & time: 02/25/2019  1158     History No chief complaint on file.   Paul [name] is a 74 y.o. male who presents emergency department with a chief complaint of shortness of breath.  There is a level 5 caveat due to some confusion which the patient admits to.  He is unable to give me a an appropriate timeline.  Patient states that he was recently diagnosed with Covid.  He has had fever, chills, body aches.  A little bit of diarrhea.  No nausea or vomiting, no abdominal pain or urinary symptoms.  Upon arrival patient was 90% on room air.  He has a history of diabetes, obesity, sleep apnea, hypertension, hyperlipidemia, chronic kidney disease.  HPI     Past Medical History:  Diagnosis Date  . Colon polyps 12/09/2009   Multiple in descending colon  . Depression   . Diabetes mellitus    type 2  . Diabetic retinopathy associated with controlled type 2 diabetes mellitus ([place]) 06/26/2018  . Diverticulosis 12/09/2009   Moderate  . Heart murmur   . Hyperlipidemia   . Hypertension   . melanoma dx'd 12/2012   rt arm; surg   . Microalbuminuria   . Sleep apnea    CPAP    Patient Active Problem List   Diagnosis Date Noted  . Diabetic retinopathy associated with controlled type 2 diabetes mellitus ([place]) 06/26/2018  . BRCA2 positive 02/06/2015  . Genetic testing 02/06/2015  . 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 CKD (chronic kidney disease), stage III ([place]) 09/19/2014  . Obstructive sleep apnea 03/27/2014  . Former smoker 01/02/2014  . Chronic diastolic heart failure ([place]) 11/26/2013  . Moderate aortic stenosis 11/26/2013  . Diverticulosis of colon with hemorrhage 08/10/2013  . Hx of adenomatous colonic polyps 08/10/2013  . History of melanoma 12/18/2012  . Erectile dysfunction 09/04/2009  . Morbid obesity ([place]) 12/28/2007  . Gout 06/28/2007  . Anemia 12/27/2006  . Type 2 diabetes,  controlled, with renal manifestation ([place]) 07/28/2006  . Hyperlipidemia 07/28/2006  . Major depression in full remission ([place]) 07/28/2006  . Hypertension associated with diabetes ([place]) 07/28/2006  . MICROALBUMINURIA 07/28/2006    Past Surgical History:  Procedure Laterality Date  . COLONOSCOPY    . MASS EXCISION  09/29/2011   [facility]-Excision sebaceous cyst on neck  . MELANOMA EXCISION  12/2012  . POLYPECTOMY    . TONSILLECTOMY AND ADENOIDECTOMY         Family History  Problem Relation Age of Onset  . Heart attack Mother        27 from chemo  . Breast cancer Mother        dx. <68; +chemo  . Hypertension Father   . Cerebral aneurysm Father   . BRCA 1/2 Maternal Aunt        never tested, but daughter has known mutation in BRCA2  . Kidney failure Maternal Aunt   . Stomach cancer Maternal Uncle        d. 43; smoker and issues with heartburn  . Heart Problems Paternal Grandfather   . BRCA 1/2 Cousin        BRCA2+; maternal 1st cousin; first person tested in family  . Breast cancer Cousin 43  . Colon cancer Neg Hx   . Colon polyps Neg Hx   . Esophageal cancer Neg Hx   . Rectal cancer Neg Hx     Social History  Tobacco Use  . Smoking status: Former Smoker    Packs/day: 1.50    Years: 45.00    Pack years: 67.50    Types: Cigars, Cigarettes    Quit date: 09/13/1998    Years since quitting: 20.4  . Smokeless tobacco: Never Used  . Tobacco comment: had CT of abd and pelvis   Substance Use Topics  . Alcohol use: Yes    Alcohol/week: 0.0 standard drinks    Comment: very little/occ  . Drug use: No    Home Medications Prior to Admission medications   Medication Sig Start Date End Date Taking? Authorizing Provider  ACCU-CHEK FASTCLIX LANCETS MISC Use to check blood sugar 2 times per day 12/01/17   Renato Shin, MD  atorvastatin (LIPITOR) 20 MG tablet TAKE 1 TABLET BY MOUTH  DAILY AT 6PM 09/11/18   Marin Olp, MD  Blood Glucose Monitoring Suppl (ACCU-CHEK GUIDE)  w/Device KIT 1 each by Does not apply route 2 (two) times daily. 11/10/17   Renato Shin, MD  bumetanide (BUMEX) 2 MG tablet Take 1 tablet by mouth  daily Patient taking differently: Take 2 tablets in the morning and 1 in the afternoon 08/04/15   Marin Olp, MD  Cholecalciferol (VITAMIN D-3) 1000 UNITS CAPS Take 2,000 Units by mouth daily.    [provider]  cloNIDine (CATAPRES) 0.1 MG tablet Take 1 tablet (0.1 mg total) by mouth 2 (two) times daily. 08/08/18   Marin Olp, MD  diltiazem (CARDIZEM CD) 360 MG 24 hr capsule TAKE 1 CAPSULE BY MOUTH  DAILY WITH BREAKFAST 02/05/19   Marin Olp, MD  glucose blood test strip Use to monitor glucose levels twice per day 12/01/17   Renato Shin, MD  hydrALAZINE (APRESOLINE) 100 MG tablet Take 150 mg by mouth 2 (two) times daily. Taking 1 tablet my mouth in the morning and two in the evening.    [provider]  Insulin Glargine (LANTUS SOLOSTAR) 100 UNIT/ML Solostar Pen Inject 105 Units into the skin every morning. 12/13/18   Renato Shin, MD  Insulin Pen Needle 31G X 8 MM MISC Inject insulin two times a day. 09/07/17   Renato Shin, MD  isosorbide mononitrate (IMDUR) 30 MG 24 hr tablet  06/08/18   [provider]  labetalol (NORMODYNE) 200 MG tablet TAKE 1 TABLET BY MOUTH TWO  TIMES DAILY Patient taking differently: TAKE 1 and a half tablets by mouth in the morining and evening. Take 1 tablet by mouth in the afternoon. 12/22/16   Marin Olp, MD  Lancet Devices (AUTOLET IMPRESSION) MISC You to check blood sugar 2 times a day 09/28/13   Renato Shin, MD  Multiple Vitamins-Minerals [facility]) TABS Take 1 tablet by mouth daily.    [provider]  Omega-3 Fatty Acids (FISH OIL) 1200 MG CAPS Take 1,200 mg by mouth daily.    [provider]  PARoxetine (PAXIL) 20 MG tablet TAKE 1 TABLET BY MOUTH  DAILY 02/05/19   Marin Olp, MD  tadalafil (CIALIS) 20 MG tablet Take 0.5-1 tablets  (10-20 mg total) by mouth every other day as needed for erectile dysfunction. 06/18/14   Marin Olp, MD  telmisartan (MICARDIS) 80 MG tablet Take 1 tablet by mouth once daily 09/11/18   Marin Olp, MD    Allergies    Amlodipine and Spironolactone  Review of Systems   Review of Systems Ten systems reviewed and are negative for acute change, except as noted in  the HPI.   Physical Exam Updated Vital Signs BP 116/72   Pulse 68   Temp (!) 102.7 F (39.3 C) (Oral)   Resp (!) 25   Ht '5\' 4"'$  (1.626 m)   Wt 102 kg   SpO2 95%   BMI 38.60 kg/m   Physical Exam Vitals and nursing note reviewed.  Constitutional:      General: He is not in acute distress.    Appearance: He is well-developed. He is ill-appearing. He is not diaphoretic.  HENT:     Head: Normocephalic and atraumatic.  Eyes:     General: No scleral icterus.    Conjunctiva/sclera: Conjunctivae normal.  Cardiovascular:     Rate and Rhythm: Regular rhythm. Tachycardia present.     Heart sounds: Normal heart sounds.  Pulmonary:     Effort: Pulmonary effort is normal. Tachypnea present. No respiratory distress.     Breath sounds: Examination of the right-middle field reveals rales. Examination of the left-middle field reveals rales. Examination of the right-lower field reveals rales. Examination of the left-lower field reveals rales. Rales present.  Abdominal:     Palpations: Abdomen is soft.     Tenderness: There is no abdominal tenderness.  Musculoskeletal:     Cervical back: Normal range of motion and neck supple.  Skin:    General: Skin is warm and dry.     Comments: Hot to touch  Neurological:     Mental Status: He is alert.  Psychiatric:        Behavior: Behavior normal.     ED Results / Procedures / Treatments   Labs (all labs ordered are listed, but only abnormal results are displayed) Labs Reviewed  CBG MONITORING, ED - Abnormal; Notable for the following components:      Result Value    Glucose-Capillary 108 (*)    All other components within normal limits  CULTURE, BLOOD (ROUTINE X 2)  CULTURE, BLOOD (ROUTINE X 2)  RESPIRATORY PANEL BY RT PCR (FLU A&B, COVID)  LACTIC ACID, PLASMA  CBC WITH DIFFERENTIAL/PLATELET  LACTIC ACID, PLASMA  COMPREHENSIVE METABOLIC PANEL  D-DIMER, QUANTITATIVE (NOT AT [facility])  LACTATE DEHYDROGENASE  FERRITIN  FIBRINOGEN  C-REACTIVE PROTEIN  TRIGLYCERIDES  PROCALCITONIN    EKG EKG Interpretation  Date/Time:  Thursday February 22 2019 12:16:25 EST Ventricular Rate:  66 PR Interval:    QRS Duration: 150 QT Interval:  454 QTC Calculation: 476 R Axis:   166 Text Interpretation: Sinus rhythm RBBB and LPFB Inferior infarct, age indeterminate Baseline wander in lead(s) V2 V5 When compared to prior, similar RBBB. more wandering baseline. NO STEMI Confirmed by Antony Blackbird [phone]) on 02/20/2019 12:24:20 PM   Radiology DG Chest Port 1 View  Result Date: 03/17/2019 CLINICAL DATA:  COVID. EXAM: PORTABLE CHEST 1 VIEW COMPARISON:  No prior. FINDINGS: Borderline cardiomegaly. No pulmonary venous congestion. Bilateral diffuse interstitial prominence noted. Interstitial edema and/or pneumonitis could present this fashion. Tiny bilateral pleural effusions cannot be excluded. No pneumothorax. IMPRESSION: 1.  Borderline cardiomegaly. 2. Bilateral diffuse interstitial prominence noted. Interstitial edema and/or pneumonitis could present this fashion. Tiny bilateral pleural effusions cannot be excluded. Electronically Signed   By: Marcello Moores  Register   On: 03/03/2019 12:54    Procedures .Critical Care Performed by: Margarita Mail, PA-C Authorized by: Margarita Mail, PA-C   Critical care provider statement:    Critical care time (minutes):  40   Critical care time was exclusive of:  Separately billable procedures and treating other patients   Critical care  was necessary to treat or prevent imminent or life-threatening deterioration of the following  conditions:  Respiratory failure   Critical care was time spent personally by me on the following activities:  Discussions with consultants, evaluation of patient's response to treatment, examination of patient, ordering and performing treatments and interventions, ordering and review of laboratory studies, ordering and review of radiographic studies, pulse oximetry, re-evaluation of patient's condition, obtaining history from patient or surrogate and review of old charts   (including critical care time)  Medications Ordered in ED Medications  acetaminophen (TYLENOL) tablet 1,000 mg (1,000 mg Oral Given 03/10/2019 1236)    ED Course  I have reviewed the triage vital signs and the nursing notes.  Pertinent labs & imaging results that were available during my care of the patient were reviewed by me and considered in my medical decision making (see chart for details).  Clinical Course as of Feb 22 1312  Thu Feb 22, 2019  1314 Temp(!): 102.7 F (39.3 C) [AH]    Clinical Course User Index [AH] Margarita Mail, PA-C   MDM Rules/Calculators/A&P                     74 year old male with outpatient Covid test, reaffirmed here with PCR test showing positive coronavirus.  Labs also show elevated glucose of 108, elevated inflammatory markers, acute kidney injury with a creatinine of 2.3 and BUN of 31.  Patient has some mild AST and ALT elevation.  CBC shows no abnormalities.  Portable chest x-ray shows bilateral infiltrates and small pleural effusion on my interpretation.  EKG shows ventricular rate of 66 with a right bundle branch block unchanged from previous.  Patient will be admitted to the hospitalist service for acute hypoxic respiratory failure secondary to COVID-19 infection.   BREVAN LUBERTO was evaluated in Emergency Department on 02/19/2019 for the symptoms described in the history of present illness. He was evaluated in the context of the global COVID-19 pandemic, which necessitated  consideration that the patient might be at risk for infection with the SARS-CoV-2 virus that causes COVID-19. Institutional protocols and algorithms that pertain to the evaluation of patients at risk for COVID-19 are in a state of rapid change based on information released by regulatory bodies including the CDC and federal and state organizations. These policies and algorithms were followed during the patient's care in the ED.  Final Clinical Impression(s) / ED Diagnoses Final diagnoses:  None    Rx / DC Orders ED Discharge Orders    None       Margarita Mail, PA-C 03/08/2019 1550    Tegeler, Gwenyth Allegra, MD 02/20/2019 [phone]

## 2019-02-22 NOTE — Telephone Encounter (Signed)
Patient's daughter called in saying that she was advised to see what her father's oxygen levels to determine if he needed to go to the hospital or not and she states when resting it was 87 but after taking a few deep breaths it went up to 89. Spoke with Bevelyn Ngo and she advised patient go to hospital.

## 2019-02-22 NOTE — Patient Instructions (Signed)
Health Maintenance Due  Topic Date Due  . INFLUENZA VACCINE -11/02/2018 08/19/2018  . FOOT EXAM -will address at next OV 02/28/2019   Depression screen [facility] 2/9 06/15/2018 01/05/2018 09/05/2017 05/26/2017 05/04/2017  Decreased Interest 0 1 0 0 0  Down, Depressed, Hopeless 0 0 0 0 0  PHQ - 2 Score 0 1 0 0 0  Altered sleeping 0 0 - - 1  Tired, decreased energy 2 0 - - 0  Change in appetite 0 0 - - 0  Feeling bad or failure about yourself  0 1 - - 1  Trouble concentrating 0 0 - - 0  Moving slowly or fidgety/restless 0 0 - - 0  Suicidal thoughts 0 0 - - 0  PHQ-9 Score 2 2 - - 2  Difficult doing work/chores Not difficult at all Not difficult at all - - Somewhat difficult  Some recent data might be hidden

## 2019-02-22 NOTE — ED Triage Notes (Signed)
Patient is here with reports of low oxygen sat (85%). He says he tested positive for COVID-19 today. Reports having a productive cough form taking Robitussin, reports a temp of 102.4. (102.7 on arrival)  90-91% on room air, pt is placed on 2lit [place]

## 2019-02-22 NOTE — Telephone Encounter (Signed)
Agree with disposition to the hospital-spoke with daughter after he was already in the hospital

## 2019-02-22 NOTE — ED Notes (Signed)
[facility] ([facility]Transfer to [facility]) called @ [phone]-per RN called by Levada Dy

## 2019-02-22 NOTE — Telephone Encounter (Signed)
Patients daughter called in and stated that the patient was able to get a rapid Covid test and it did come back positive. Patient daughter wants to know what the next step should be. His daughter also said they were waiting on a oxygen tank and that her dads temp is at 102, plus he seems confused and he cant keep his eyes open. Please advise. Patient daughter would like a call back

## 2019-02-22 NOTE — Telephone Encounter (Signed)
See below

## 2019-02-22 NOTE — Progress Notes (Signed)
Patient refused CPAP for the night. Patient wearing oxygen set at 2lpm with Sp02=94%

## 2019-02-22 NOTE — H&P (Signed)
History and Physical    Paul [name] NOM:[id number] DOB: 05-24-1945 DOA: 02/20/2019  PCP: Marin Olp, MD  Patient coming from: Home  I have personally briefly reviewed patient's old medical records in [place]  Chief Complaint: Generalized weakness  HPI: Paul [name] is a 74 y.o. male with medical history significant of diastolic heart failure, chronic kidney disease stage III, diabetes, hypertension, sleep apnea on CPAP, presents with myalgias, generalized weakness for the last several days.  He has not had any nausea, vomiting, abdominal pain.  Is been increasingly weak and fatigued.  Denies any cough.  He has been having fevers and was noted to be hypoxic when pulse ox was checked at home.  He was brought to the ER for evaluation.  He reports that he has been compliant with his medications.  ED Course: Patient's Covid test returned positive.  Chest x-ray indicated multifocal pneumonia.  Creatinine was elevated above baseline at 2.3.  Baseline creatinine 1.7.  Heart rate was noted to be in the low 50s, blood pressure in the low 100s.  Inflammatory markers were checked and noted to be mildly elevated.  Procalcitonin was negative. He was started on intravenous steroids and referred for admission.  Review of Systems: As per HPI otherwise 10 point review of systems negative.    Past Medical History:  Diagnosis Date  . Colon polyps 12/09/2009   Multiple in descending colon  . Depression   . Diabetes mellitus    type 2  . Diabetic retinopathy associated with controlled type 2 diabetes mellitus ([place]) 06/26/2018  . Diverticulosis 12/09/2009   Moderate  . Heart murmur   . Hyperlipidemia   . Hypertension   . melanoma dx'd 12/2012   rt arm; surg   . Microalbuminuria   . Sleep apnea    CPAP    Past Surgical History:  Procedure Laterality Date  . COLONOSCOPY    . MASS EXCISION  09/29/2011   [facility]-Excision sebaceous cyst on neck  . MELANOMA EXCISION  12/2012  .  POLYPECTOMY    . TONSILLECTOMY AND ADENOIDECTOMY      Social History:  reports that he quit smoking about 20 years ago. His smoking use included cigars and cigarettes. He has a 67.50 pack-year smoking history. He has never used smokeless tobacco. He reports current alcohol use. He reports that he does not use drugs.  Allergies  Allergen Reactions  . Amlodipine Swelling  . Spironolactone     gynecomastia    Family History  Problem Relation Age of Onset  . Heart attack Mother        47 from chemo  . Breast cancer Mother        dx. <68; +chemo  . Hypertension Father   . Cerebral aneurysm Father   . BRCA 1/2 Maternal Aunt        never tested, but daughter has known mutation in BRCA2  . Kidney failure Maternal Aunt   . Stomach cancer Maternal Uncle        d. 5; smoker and issues with heartburn  . Heart Problems Paternal Grandfather   . BRCA 1/2 Cousin        BRCA2+; maternal 1st cousin; first person tested in family  . Breast cancer Cousin 68  . Colon cancer Neg Hx   . Colon polyps Neg Hx   . Esophageal cancer Neg Hx   . Rectal cancer Neg Hx     Prior to Admission medications   Medication Sig  Start Date End Date Taking? Authorizing Provider  ACCU-CHEK FASTCLIX LANCETS MISC Use to check blood sugar 2 times per day 12/01/17  Yes Renato Shin, MD  acetaZOLAMIDE (DIAMOX) 125 MG tablet Take 125 mg by mouth 2 (two) times daily.   Yes [provider]  atorvastatin (LIPITOR) 20 MG tablet TAKE 1 TABLET BY MOUTH  DAILY AT 6PM Patient taking differently: Take 20 mg by mouth daily.  09/11/18  Yes Marin Olp, MD  Blood Glucose Monitoring Suppl (ACCU-CHEK GUIDE) w/Device KIT 1 each by Does not apply route 2 (two) times daily. 11/10/17  Yes Renato Shin, MD  bumetanide (BUMEX) 2 MG tablet Take 1 tablet by mouth  daily Patient taking differently: Take 2 mg by mouth See admin instructions. Take 59m tablet by mouth in the morning and then take 252mby mouth in the evening  08/04/15  Yes HuMarin OlpMD  Cholecalciferol (VITAMIN D-3) 1000 UNITS CAPS Take 2,000 Units by mouth daily.   Yes [provider]  cloNIDine (CATAPRES) 0.1 MG tablet Take 1 tablet (0.1 mg total) by mouth 2 (two) times daily. 08/08/18  Yes HuMarin OlpMD  diltiazem (CARDIZEM CD) 360 MG 24 hr capsule TAKE 1 CAPSULE BY MOUTH  DAILY WITH BREAKFAST Patient taking differently: Take 360 mg by mouth daily.  02/05/19  Yes HuMarin OlpMD  glucose blood test strip Use to monitor glucose levels twice per day 12/01/17  Yes ElRenato ShinMD  hydrALAZINE (APRESOLINE) 100 MG tablet Take 150 mg by mouth 2 (two) times daily. Taking 1 tablet my mouth in the morning and two in the evening.   Yes [provider]  Insulin Glargine (LANTUS SOLOSTAR) 100 UNIT/ML Solostar Pen Inject 105 Units into the skin every morning. 12/13/18  Yes ElRenato ShinMD  Insulin Pen Needle 31G X 8 MM MISC Inject insulin two times a day. 09/07/17  Yes ElRenato ShinMD  labetalol (NORMODYNE) 200 MG tablet TAKE 1 TABLET BY MOUTH TWO  TIMES DAILY Patient taking differently: Take 300 mg by mouth 2 (two) times daily.  12/22/16  Yes HuMarin OlpMD  Lancet Devices (AUTOLET IMPRESSION) MISC You to check blood sugar 2 times a day 09/28/13  Yes ElRenato ShinMD  Multiple Vitamins-Minerals ([facility]CECedar GroveTABS Take 1 tablet by mouth daily.   Yes [provider]  Omega-3 Fatty Acids (FISH OIL) 1200 MG CAPS Take 1,200 mg by mouth daily.   Yes [provider]  PARoxetine (PAXIL) 20 MG tablet TAKE 1 TABLET BY MOUTH  DAILY Patient taking differently: Take 20 mg by mouth daily.  02/05/19  Yes HuMarin OlpMD  tadalafil (CIALIS) 20 MG tablet Take 0.5-1 tablets (10-20 mg total) by mouth every other day as needed for erectile dysfunction. 06/18/14  Yes HuMarin OlpMD  telmisartan (MICARDIS) 80 MG tablet Take 1 tablet by mouth once daily Patient taking differently: Take 80 mg by mouth  daily.  09/11/18  Yes HuMarin OlpMD    Physical Exam: Vitals:   03/14/2019 1630 03/15/2019 1700 03/02/2019 1730 03/03/2019 1747  BP: (!) 107/40 (!) 99/54 (!) 102/48   Pulse: (!) 51 (!) 50 (!) 49   Resp: (!) 21 (!) 21 (!) 21   Temp:    99.8 F (37.7 C)  TempSrc:    Oral  SpO2: 94% 96% 95%   Weight:      Height:        Constitutional: NAD, calm, comfortable Eyes:  PERRL, lids and conjunctivae normal ENMT: Mucous membranes are moist. Posterior pharynx clear of any exudate or lesions.Normal dentition.  Neck: normal, supple, no masses, no thyromegaly Respiratory: clear to auscultation bilaterally, no wheezing, no crackles. Normal respiratory effort. No accessory muscle use.  Cardiovascular: Regular rate and rhythm, no murmurs / rubs / gallops. No extremity edema. 2+ pedal pulses. No carotid bruits.  Abdomen: no tenderness, no masses palpated. No hepatosplenomegaly. Bowel sounds positive.  Musculoskeletal: no clubbing / cyanosis. No joint deformity upper and lower extremities. Good ROM, no contractures. Normal muscle tone.  Skin: no rashes, lesions, ulcers. No induration Neurologic: CN 2-12 grossly intact. Sensation intact, DTR normal. Strength 5/5 in all 4.  Psychiatric: Normal judgment and insight. Alert and oriented x 3. Normal mood.    Labs on Admission: I have personally reviewed following labs and imaging studies  CBC: Recent Labs  Lab 03/13/2019 1225  WBC 4.7  NEUTROABS 3.3  HGB 14.2  HCT 44.1  MCV 92.3  PLT 633   Basic Metabolic Panel: Recent Labs  Lab 03/17/2019 1225  NA 138  K 3.6  CL 98  CO2 27  GLUCOSE 110*  BUN 31*  CREATININE 2.30*  CALCIUM 8.9   GFR: Estimated Creatinine Clearance: 29.9 mL/min (A) (by C-G formula based on SCr of 2.3 mg/dL (H)). Liver Function Tests: Recent Labs  Lab 03/18/2019 1225  AST 78*  ALT 61*  ALKPHOS 84  BILITOT 0.8  PROT 7.0  ALBUMIN 3.4*   No results for input(s): LIPASE, AMYLASE in the last 168 hours. No results for  input(s): AMMONIA in the last 168 hours. Coagulation Profile: No results for input(s): INR, PROTIME in the last 168 hours. Cardiac Enzymes: No results for input(s): CKTOTAL, CKMB, CKMBINDEX, TROPONINI in the last 168 hours. BNP (last 3 results) No results for input(s): PROBNP in the last 8760 hours. HbA1C: No results for input(s): HGBA1C in the last 72 hours. CBG: Recent Labs  Lab 03/15/2019 1238  GLUCAP 108*   Lipid Profile: Recent Labs    02/20/2019 1225  TRIG 151*   Thyroid Function Tests: No results for input(s): TSH, T4TOTAL, FREET4, T3FREE, THYROIDAB in the last 72 hours. Anemia Panel: Recent Labs    03/10/2019 1225  FERRITIN 510*   Urine analysis:    Component Value Date/Time   BILIRUBINUR Negative 09/05/2017 1009   PROTEINUR Positive (A) 09/05/2017 1009   UROBILINOGEN 0.2 09/05/2017 1009   NITRITE Negative 09/05/2017 1009   LEUKOCYTESUR Negative 09/05/2017 1009    Radiological Exams on Admission: DG Chest Port 1 View  Result Date: 03/16/2019 CLINICAL DATA:  COVID. EXAM: PORTABLE CHEST 1 VIEW COMPARISON:  No prior. FINDINGS: Borderline cardiomegaly. No pulmonary venous congestion. Bilateral diffuse interstitial prominence noted. Interstitial edema and/or pneumonitis could present this fashion. Tiny bilateral pleural effusions cannot be excluded. No pneumothorax. IMPRESSION: 1.  Borderline cardiomegaly. 2. Bilateral diffuse interstitial prominence noted. Interstitial edema and/or pneumonitis could present this fashion. Tiny bilateral pleural effusions cannot be excluded. Electronically Signed   By: Marcello Moores  Register   On: 02/26/2019 12:54    EKG: Independently reviewed.  Sinus rhythm with right bundle branch block, no acute changes  Assessment/Plan Active Problems:   Type 2 diabetes, controlled, with renal manifestation (HCC)   Hypertension associated with diabetes ([place])   Chronic diastolic heart failure (H[facility])   CKD (chronic kidney disease), stage III ([place])    Pneumonia due to COVID-19 virus   Acute respiratory failure with hypoxia ([place])   AKI (acute kidney injury) ([place])  1. Acute respiratory failure with hypoxia.  Secondary to COVID-19 pneumonia.  Continue supplemental oxygen and wean down as tolerated.  Continue on intravenous steroids and will start the patient on remdesivir.  If respiratory status decompensates, can consider Actemra.  He is currently on 2 L of oxygen and appears comfortable.  He was noted to be febrile in the emergency room, but this has improved with Tylenol. 2. Acute kidney injury on chronic kidney disease stage III.  Baseline creatinine 1.7.  Admission creatinine noted to be 2.3. Likely related to dehydration in the setting of ARB use.  Hold ARB.  Provide gentle hydration for the next 12 hours.  Recheck labs in a.m. 3. Chronic diastolic heart failure.  Appears to be compensated at this time.  Holding diuretics for now.  We will add BNP to lab work. 4. Hypertension.  Blood pressures running low.  Hold antihypertensive medications for now.  Resume as blood pressure will tolerate. 5. Diabetes.  Continue on basal insulin.  We will add linagliptin.  Start on sliding scale insulin.  DVT prophylaxis: heparin  Code Status: full code  Family Communication: discussed with wife and daughter over the phone  Disposition Plan: admit to [facility] for further care  Consults called:   Admission status: inpatient, tele   Kathie Dike MD [facility]   If 7PM-7AM, please contact night-coverage [email or web address]   03/07/2019, 7:00 PM \

## 2019-02-23 DIAGNOSIS — E669 Obesity, unspecified: Secondary | ICD-10-CM | POA: Diagnosis present

## 2019-02-23 LAB — COMPREHENSIVE METABOLIC PANEL
ALT: 50 U/L — ABNORMAL HIGH (ref 0–44)
AST: 59 U/L — ABNORMAL HIGH (ref 15–41)
Albumin: 3 g/dL — ABNORMAL LOW (ref 3.5–5.0)
Alkaline Phosphatase: 72 U/L (ref 38–126)
Anion gap: 12 (ref 5–15)
BUN: 47 mg/dL — ABNORMAL HIGH (ref 8–23)
CO2: 23 mmol/L (ref 22–32)
Calcium: 8.6 mg/dL — ABNORMAL LOW (ref 8.9–10.3)
Chloride: 101 mmol/L (ref 98–111)
Creatinine, Ser: 2.54 mg/dL — ABNORMAL HIGH (ref 0.61–1.24)
GFR calc Af Amer: 28 mL/min — ABNORMAL LOW (ref >60)
GFR calc non Af Amer: 24 mL/min — ABNORMAL LOW (ref >60)
Glucose, Bld: 313 mg/dL — ABNORMAL HIGH (ref 70–99)
Potassium: 4 mmol/L (ref 3.5–5.1)
Sodium: 136 mmol/L (ref 135–145)
Total Bilirubin: 0.8 mg/dL (ref 0.3–1.2)
Total Protein: 6.3 g/dL — ABNORMAL LOW (ref 6.5–8.1)

## 2019-02-23 LAB — D-DIMER, QUANTITATIVE: D-Dimer, Quant: 0.86 ug/mL-FEU — ABNORMAL HIGH (ref 0.00–0.50)

## 2019-02-23 LAB — CBC WITH DIFFERENTIAL/PLATELET
Abs Immature Granulocytes: 0.01 10*3/uL (ref 0.00–0.07)
Basophils Absolute: 0 10*3/uL (ref 0.0–0.1)
Basophils Relative: 0 %
Eosinophils Absolute: 0 10*3/uL (ref 0.0–0.5)
Eosinophils Relative: 0 %
HCT: 37.8 % — ABNORMAL LOW (ref 39.0–52.0)
Hemoglobin: 12.2 g/dL — ABNORMAL LOW (ref 13.0–17.0)
Immature Granulocytes: 0 %
Lymphocytes Relative: 18 %
Lymphs Abs: 0.6 10*3/uL — ABNORMAL LOW (ref 0.7–4.0)
MCH: 29.4 pg (ref 26.0–34.0)
MCHC: 32.3 g/dL (ref 30.0–36.0)
MCV: 91.1 fL (ref 80.0–100.0)
Monocytes Absolute: 0.2 10*3/uL (ref 0.1–1.0)
Monocytes Relative: 5 %
Neutro Abs: 2.4 10*3/uL (ref 1.7–7.7)
Neutrophils Relative %: 77 %
Platelets: 135 10*3/uL — ABNORMAL LOW (ref 150–400)
RBC: 4.15 MIL/uL — ABNORMAL LOW (ref 4.22–5.81)
RDW: 14.1 % (ref 11.5–15.5)
WBC: 3.1 10*3/uL — ABNORMAL LOW (ref 4.0–10.5)
nRBC: 0 % (ref 0.0–0.2)

## 2019-02-23 LAB — GLUCOSE, CAPILLARY
Glucose-Capillary: 274 mg/dL — ABNORMAL HIGH (ref 70–99)
Glucose-Capillary: 311 mg/dL — ABNORMAL HIGH (ref 70–99)
Glucose-Capillary: 306 mg/dL — ABNORMAL HIGH (ref 70–99)
Glucose-Capillary: 256 mg/dL — ABNORMAL HIGH (ref 70–99)

## 2019-02-23 LAB — C-REACTIVE PROTEIN: CRP: 1.9 mg/dL — ABNORMAL HIGH (ref <1.0)

## 2019-02-23 LAB — LACTIC ACID, PLASMA: Lactic Acid, Venous: 1.8 mmol/L (ref 0.5–1.9)

## 2019-02-23 LAB — FERRITIN: Ferritin: 473 ng/mL — ABNORMAL HIGH (ref 24–336)

## 2019-02-23 LAB — BRAIN NATRIURETIC PEPTIDE: B Natriuretic Peptide: 197 pg/mL — ABNORMAL HIGH (ref 0.0–100.0)

## 2019-02-23 MED ORDER — ZINC SULFATE 220 (50 ZN) MG PO CAPS
220.0000 mg | ORAL_CAPSULE | Freq: Every day | ORAL | Status: DC
  Administered 2019-02-23 – 2019-02-24 (×2): 220 mg via ORAL
  Filled 2019-02-23 (×2): qty 1

## 2019-02-23 MED ORDER — ASCORBIC ACID 500 MG PO TABS
500.0000 mg | ORAL_TABLET | Freq: Every day | ORAL | Status: DC
  Administered 2019-02-23 – 2019-02-24 (×2): 500 mg via ORAL
  Filled 2019-02-23 (×2): qty 1

## 2019-02-23 MED ORDER — DILTIAZEM HCL ER COATED BEADS 360 MG PO CP24
360.0000 mg | ORAL_CAPSULE | Freq: Every day | ORAL | Status: DC
  Administered 2019-02-23 – 2019-02-24 (×2): 360 mg via ORAL
  Filled 2019-02-23: qty 1
  Filled 2019-02-23 (×2): qty 2

## 2019-02-23 MED ORDER — CLONIDINE HCL 0.1 MG PO TABS
0.1000 mg | ORAL_TABLET | Freq: Two times a day (BID) | ORAL | Status: DC
  Administered 2019-02-23 – 2019-02-24 (×3): 0.1 mg via ORAL
  Filled 2019-02-23 (×4): qty 1

## 2019-02-23 MED ORDER — DEXAMETHASONE SODIUM PHOSPHATE 10 MG/ML IJ SOLN
6.0000 mg | INTRAMUSCULAR | Status: DC
  Administered 2019-02-23 – 2019-03-01 (×7): 6 mg via INTRAVENOUS
  Filled 2019-02-23 (×7): qty 1

## 2019-02-23 MED ORDER — GLUCERNA SHAKE PO LIQD: 237.0000 mL | Freq: Three times a day (TID) | ORAL | Status: DC

## 2019-02-23 MED ORDER — SODIUM CHLORIDE 0.9 % IV SOLN: INTRAVENOUS | Status: DC

## 2019-02-23 MED ORDER — IPRATROPIUM-ALBUTEROL 20-100 MCG/ACT IN AERS
1.0000 | INHALATION_SPRAY | Freq: Four times a day (QID) | RESPIRATORY_TRACT | Status: DC
  Administered 2019-02-23 – 2019-02-24 (×5): 1 via RESPIRATORY_TRACT
  Filled 2019-02-23 (×2): qty 4

## 2019-02-23 MED ORDER — INSULIN GLARGINE 100 UNIT/ML ~~LOC~~ SOLN
55.0000 [IU] | Freq: Two times a day (BID) | SUBCUTANEOUS | Status: DC
  Administered 2019-02-24: 55 [IU] via SUBCUTANEOUS
  Filled 2019-02-23 (×2): qty 0.55

## 2019-02-23 MED ORDER — INSULIN ASPART 100 UNIT/ML ~~LOC~~ SOLN
8.0000 [IU] | Freq: Three times a day (TID) | SUBCUTANEOUS | Status: DC
  Administered 2019-02-23 – 2019-02-24 (×3): 8 [IU] via SUBCUTANEOUS

## 2019-02-23 NOTE — Progress Notes (Signed)
Initial Nutrition Assessment  DOCUMENTATION CODES:   Obesity unspecified  INTERVENTION:   Glucerna Shake po TID, each supplement provides 220 kcal and 10 grams of protein  NUTRITION DIAGNOSIS:   Increased nutrient needs related to catabolic illness as evidenced by estimated needs.  GOAL:   Patient will meet greater than or equal to 90% of their needs  MONITOR:   PO intake, Supplement acceptance, Labs, Weight trends  REASON FOR ASSESSMENT:   Malnutrition Screening Tool    ASSESSMENT:   74 yo male admitted with acute respiratory failure secondary to COVID-19 pneumonia, AKI on CKD III. PMH includes CKD III, DM, HTN, CHF   Recorded po intake 100% at breakfast this AM, appetite good  Current wt 102 kg; no recent weight loss per weight encounters  Labs: CBGs 212-311, BUN 47, Creatinine 2.54 Meds:  NS at 75 ml/hr, Vitamin C, decadron, ss novolog, lantus, tradjenta, zinc sulfate   Diet Order:   Diet Order            Diet heart healthy/carb modified Room service appropriate? Yes; Fluid consistency: Thin  Diet effective now              EDUCATION NEEDS:   Not appropriate for education at this time  Skin:  Skin Assessment: Reviewed RN Assessment  Last BM:  no documented Bm  Height:   Ht Readings from Last 1 Encounters:  03/09/2019 5\' 4"  (1.626 m)    Weight:   Wt Readings from Last 1 Encounters:  03/10/2019 102 kg    Ideal Body Weight:  59 kg  BMI:  Body mass index is 38.6 kg/m.  Estimated Nutritional Needs:   Kcal:  2000-2200 kcals  Protein:  100-120 g  Fluid:  >/= 2 L   Kerman Passey MS, RDN, LDN, CNSC RD Pager Number and Weekend/On-Call After Hours Pager Located in [place]

## 2019-02-23 NOTE — Plan of Care (Signed)
  Problem: Education: Goal: Knowledge of risk factors and measures for prevention of condition will improve Outcome: Progressing   Problem: Coping: Goal: Psychosocial and spiritual needs will be supported Outcome: Progressing   Problem: Respiratory: Goal: Will maintain a patent airway Outcome: Progressing Goal: Complications related to the disease process, condition or treatment will be avoided or minimized Outcome: Progressing   

## 2019-02-23 NOTE — Progress Notes (Addendum)
PROGRESS NOTE    Paul [name]  [id number] DOB: Mar 31, 1945 DOA: 03/04/2019 PCP: Paul Olp, MD   Brief Narrative:  74 y.o. WM PMHx Diastolic CHF, CKD stage III, DM type 2 uncontrolled with complication, HTN, OSA on CPAP (does not always use)  Presents with myalgias, generalized weakness for the last several days.  He has not had any nausea, vomiting, abdominal pain.  Is been increasingly weak and fatigued.  Denies any cough.  He has been having fevers and was noted to be hypoxic when pulse ox was checked at home.  He was brought to the ER for evaluation.  He reports that he has been compliant with his medications.  ED Course: Patient's Covid test returned positive.  Chest x-ray indicated multifocal pneumonia.  Creatinine was elevated above baseline at 2.3.  Baseline creatinine 1.7.  Heart rate was noted to be in the low 50s, blood pressure in the low 100s.  Inflammatory markers were checked and noted to be mildly elevated.  Procalcitonin was negative. He was started on intravenous steroids and referred for admission.   Subjective: A/O x4, positive S OB, negative CP, negative N/V.   Assessment & Plan:   Active Problems:   Type 2 diabetes, controlled, with renal manifestation (HCC)   Hypertension associated with diabetes (Paul [name])   Chronic diastolic heart failure (HCC)   CKD (chronic kidney disease), stage III (Paul [name])   Pneumonia due to COVID-19 virus   Acute respiratory failure with hypoxia (HCC)   AKI (acute kidney injury) (Paul [name])   Obese   Covid pneumonia/acute respiratory failure with hypoxia COVID-19 Labs  Recent Labs    02/19/2019 1225 02/23/19 0000  DDIMER 1.04* 0.86*  FERRITIN 510* 473*  LDH 315*  --   CRP 1.7* 1.9*    Lab Results  Component Value Date   SARSCOV2NAA POSITIVE (A) 03/05/2019   SARSCOV2NAA RESULT:  NEGATIVE 12/13/2018   -Decadron 6 mg daily -Remdesivir per pharmacy protocol (end date 2/8) -Combivent -Vitamins per Covid protocol -Incentive  spirometry -Flutter valve -  Diastolic CHF -Strict ins and outs -Daily weight -Clonidine 0.1 mg BID -Diltiazem 360 mg daily -Hold on all her home HTN meds, including diuretic.  Patient does not appear fluid overloaded at this point.  At some point may need to restart  Essential HTN -See CHF  Acute on CKD stage III (baseline Cr 1.7) Recent Labs  Lab 02/20/2019 1225 02/23/19 0000  CREATININE 2.30* 2.54*  -Normal saline 141ml/hr  Diabetes type 2 uncontrolled with complication -[id number] Hemoglobin A1c= 7.3 -Lipid panel pending -Lantus 55 units BID -NovoLog 8 units qac -Resistant SSI  Obese (BMI 38.6 kg/m) -Nutrition consult    DVT prophylaxis: Subcu heparin Code Status: Full Family Communication:  Disposition Plan:    Consultants:    Procedures/Significant Events:     I have personally reviewed and interpreted all radiology studies and my findings are as above.  VENTILATOR SETTINGS: Nasal cannula 2/5 Flow; 2 L/min SPO2; 92%   Cultures   Antimicrobials: Anti-infectives (From admission, onward)   Start     Dose/Rate Stop   02/23/19 1000  remdesivir 100 mg in sodium chloride 0.9 % 100 mL IVPB  Status:  Discontinued     100 mg 200 mL/hr over 30 Minutes 03/02/2019 2026   02/23/19 1000  remdesivir 100 mg in sodium chloride 0.9 % 100 mL IVPB     100 mg 200 mL/hr over 30 Minutes 02/27/19 0959   02/24/2019 2023  remdesivir 200 mg in sodium chloride  0.9% 250 mL IVPB  Status:  Discontinued     200 mg 580 mL/hr over 30 Minutes 02/21/2019 2026   03/13/2019 1900  remdesivir 200 mg in sodium chloride 0.9% 250 mL IVPB     200 mg 580 mL/hr over 30 Minutes 03/05/2019 2334       Devices    LINES / TUBES:      Continuous Infusions: . sodium chloride 75 mL/hr at 03/17/2019 2145  . remdesivir 100 mg in NS 100 mL 100 mg (02/23/19 1055)     Objective: Vitals:   03/08/2019 2000 02/27/2019 2335 02/23/19 0458 02/23/19 0700  BP: 117/80 134/66 136/66 (!) 141/64  Pulse:  (!) 51 (!) 52 (!) 50   Resp: 20 20 18    Temp: (!) 97.5 F (36.4 C) 98.5 F (36.9 C) 98 F (36.7 C) 97.9 F (36.6 C)  TempSrc: Oral Oral Oral Oral  SpO2: 94% 92% 92%   Weight:      Height:        Intake/Output Summary (Last 24 hours) at 02/23/2019 1113 Last data filed at 02/23/2019 0800 Gross per 24 hour  Intake 570 ml  Output 300 ml  Net 270 ml   Filed Weights   02/28/2019 1221  Weight: 102 kg    Examination:  General: A/O x4, positive acute respiratory distress Eyes: negative scleral hemorrhage, negative anisocoria, negative icterus ENT: Negative Runny nose, negative gingival bleeding, Neck:  Negative scars, masses, torticollis, lymphadenopathy, JVD Lungs: Clear to auscultation bilaterally without wheezes or crackles Cardiovascular: Regular rate and rhythm without murmur gallop or rub normal S1 and S2 Abdomen: OBESE, negative abdominal pain, nondistended, positive soft, bowel sounds, no rebound, no ascites, no appreciable mass Extremities: No significant cyanosis, clubbing, or edema bilateral lower extremities Skin: Negative rashes, lesions, ulcers Psychiatric:  Negative depression, negative anxiety, negative fatigue, negative mania  Central nervous system:  Cranial nerves II through XII intact, tongue/uvula midline, all extremities muscle strength 5/5, sensation intact throughout,negative dysarthria, negative expressive aphasia, negative receptive aphasia.  .     Data Reviewed: Care during the described time interval was provided by me .  I have reviewed this patient's available data, including medical history, events of note, physical examination, and all test results as part of my evaluation.   CBC: Recent Labs  Lab 02/21/2019 1225 02/23/19 0000  WBC 4.7 3.1*  NEUTROABS 3.3 2.4  HGB 14.2 12.2*  HCT 44.1 37.8*  MCV 92.3 91.1  PLT 155 [id number]*   Basic Metabolic Panel: Recent Labs  Lab 03/16/2019 1225 02/23/19 0000  NA 138 136  K 3.6 4.0  CL 98 101  CO2 27 23    GLUCOSE 110* 313*  BUN 31* 47*  CREATININE 2.30* 2.54*  CALCIUM 8.9 8.6*   GFR: Estimated Creatinine Clearance: 27.1 mL/min (A) (by C-G formula based on SCr of 2.54 mg/dL (H)). Liver Function Tests: Recent Labs  Lab 03/04/2019 1225 02/23/19 0000  AST 78* 59*  ALT 61* 50*  ALKPHOS 84 72  BILITOT 0.8 0.8  PROT 7.0 6.3*  ALBUMIN 3.4* 3.0*   No results for input(s): LIPASE, AMYLASE in the last 168 hours. No results for input(s): AMMONIA in the last 168 hours. Coagulation Profile: No results for input(s): INR, PROTIME in the last 168 hours. Cardiac Enzymes: No results for input(s): CKTOTAL, CKMB, CKMBINDEX, TROPONINI in the last 168 hours. BNP (last 3 results) No results for input(s): PROBNP in the last 8760 hours. HbA1C: No results for input(s): HGBA1C in the last 72  hours. CBG: Recent Labs  Lab 02/28/2019 1238 02/21/2019 2045 02/23/19 0732  GLUCAP 108* 212* 274*   Lipid Profile: Recent Labs    02/19/2019 1225  TRIG 151*   Thyroid Function Tests: No results for input(s): TSH, T4TOTAL, FREET4, T3FREE, THYROIDAB in the last 72 hours. Anemia Panel: Recent Labs    03/14/2019 1225 02/23/19 0000  FERRITIN 510* 473*   Urine analysis:    Component Value Date/Time   BILIRUBINUR Negative 09/05/2017 1009   PROTEINUR Positive (A) 09/05/2017 1009   UROBILINOGEN 0.2 09/05/2017 1009   NITRITE Negative 09/05/2017 1009   LEUKOCYTESUR Negative 09/05/2017 1009   Sepsis Labs: @LABRCNTIP (procalcitonin:4,lacticidven:4)  ) Recent Results (from the past 240 hour(s))  Respiratory Panel by RT PCR (Flu A&B, Covid) - Nasopharyngeal Swab     Status: Abnormal   Collection Time: 02/27/2019 12:26 PM   Specimen: Nasopharyngeal Swab  Result Value Ref Range Status   SARS Coronavirus 2 by RT PCR POSITIVE (A) NEGATIVE Final    Comment: RESULT CALLED TO, READ BACK BY AND VERIFIED WITH: Bennie Hind RN 15:10 02/19/2019 ([email or web address]) (NOTE) SARS-CoV-2 target nucleic acids are DETECTED. SARS-CoV-2 RNA is  generally detectable in upper respiratory specimens  during the acute phase of infection. Positive results are indicative of the presence of the identified virus, but do not rule out bacterial infection or co-infection with other pathogens not detected by the test. Clinical correlation with patient history and other diagnostic information is necessary to determine patient infection status. The expected result is Negative. Fact Sheet for Patients:  PinkCheek.be Fact Sheet for Healthcare Providers: GravelBags.it This test is not yet approved or cleared by the [place] FDA and  has been authorized for detection and/or diagnosis of SARS-CoV-2 by FDA under an Emergency Use Authorization (EUA).  This EUA will remain in effect (meaning this test can be used)  for the duration of  the COVID-19 declaration under Section 564(b)(1) of the Act, 21 U.S.C. section 360bbb-3(b)(1), unless the authorization is terminated or revoked sooner.    Influenza A by PCR NEGATIVE NEGATIVE Final   Influenza B by PCR NEGATIVE NEGATIVE Final    Comment: (NOTE) The Xpert Xpress SARS-CoV-2/FLU/RSV assay is intended as an aid in  the diagnosis of influenza from Nasopharyngeal swab specimens and  should not be used as a sole basis for treatment. Nasal washings and  aspirates are unacceptable for Xpert Xpress SARS-CoV-2/FLU/RSV  testing. Fact Sheet for Patients: PinkCheek.be Fact Sheet for Healthcare Providers: GravelBags.it This test is not yet approved or cleared by the [place] FDA and  has been authorized for detection and/or diagnosis of SARS-CoV-2 by  FDA under an Emergency Use Authorization (EUA). This EUA will remain  in effect (meaning this test can be used) for the duration of the  Covid-19 declaration under Section 564(b)(1) of the Act, 21  U.S.C. section 360bbb-3(b)(1), unless the  authorization is  terminated or revoked. Performed at [facility], [place] [address], [place], [place] [address]          Radiology Studies: DG Chest Port 1 View  Result Date: 02/26/2019 CLINICAL DATA:  COVID. EXAM: PORTABLE CHEST 1 VIEW COMPARISON:  No prior. FINDINGS: Borderline cardiomegaly. No pulmonary venous congestion. Bilateral diffuse interstitial prominence noted. Interstitial edema and/or pneumonitis could present this fashion. Tiny bilateral pleural effusions cannot be excluded. No pneumothorax. IMPRESSION: 1.  Borderline cardiomegaly. 2. Bilateral diffuse interstitial prominence noted. Interstitial edema and/or pneumonitis could present this fashion. Tiny bilateral pleural effusions cannot be excluded. Electronically  Signed   By: Marcello Moores  Register   On: 03/10/2019 12:54        Scheduled Meds: . vitamin C  500 mg Oral Daily  . atorvastatin  20 mg Oral Daily  . dexamethasone (DECADRON) injection  6 mg Intravenous Q24H  . heparin  5,000 Units Subcutaneous Q8H  . insulin aspart  0-20 Units Subcutaneous TID WC  . insulin aspart  0-5 Units Subcutaneous QHS  . insulin glargine  105 Units Subcutaneous Daily  . Ipratropium-Albuterol  1 puff Inhalation QID  . linagliptin  5 mg Oral Daily  . PARoxetine  20 mg Oral Daily  . zinc sulfate  220 mg Oral Daily   Continuous Infusions: . sodium chloride 75 mL/hr at 03/16/2019 2145  . remdesivir 100 mg in NS 100 mL 100 mg (02/23/19 1055)     LOS: 1 day   The patient is critically ill with multiple organ systems failure and requires high complexity decision making for assessment and support, frequent evaluation and titration of therapies, application of advanced monitoring technologies and extensive interpretation of multiple databases. Critical Care Time devoted to patient care services described in this note  Time spent: 40 minutes     Kern Gingras, Geraldo Docker, MD [facility] Pager [phone]  If 7PM-7AM, please contact  night-coverage [email or web address] Password [email or web address] 02/23/2019, 11:13 AM

## 2019-02-24 ENCOUNTER — Inpatient Hospital Stay (HOSPITAL_COMMUNITY): Payer: Medicare Other

## 2019-02-24 LAB — CBC WITH DIFFERENTIAL/PLATELET
Abs Immature Granulocytes: 0.1 10*3/uL — ABNORMAL HIGH (ref 0.00–0.07)
Basophils Absolute: 0 10*3/uL (ref 0.0–0.1)
Basophils Relative: 0 %
Eosinophils Absolute: 0 10*3/uL (ref 0.0–0.5)
Eosinophils Relative: 0 %
HCT: 39 % (ref 39.0–52.0)
Hemoglobin: 13 g/dL (ref 13.0–17.0)
Immature Granulocytes: 1 %
Lymphocytes Relative: 6 %
Lymphs Abs: 0.9 10*3/uL (ref 0.7–4.0)
MCH: 30.3 pg (ref 26.0–34.0)
MCHC: 33.3 g/dL (ref 30.0–36.0)
MCV: 90.9 fL (ref 80.0–100.0)
Monocytes Absolute: 0.6 10*3/uL (ref 0.1–1.0)
Monocytes Relative: 5 %
Neutro Abs: 12.7 10*3/uL — ABNORMAL HIGH (ref 1.7–7.7)
Neutrophils Relative %: 88 %
Platelets: 166 10*3/uL (ref 150–400)
RBC: 4.29 MIL/uL (ref 4.22–5.81)
RDW: 14 % (ref 11.5–15.5)
WBC: 14.3 10*3/uL — ABNORMAL HIGH (ref 4.0–10.5)
nRBC: 0 % (ref 0.0–0.2)

## 2019-02-24 LAB — TROPONIN I (HIGH SENSITIVITY)
Troponin I (High Sensitivity): 71 ng/L — ABNORMAL HIGH (ref <18)
Troponin I (High Sensitivity): 130 ng/L (ref <18)
Troponin I (High Sensitivity): 194 ng/L (ref <18)

## 2019-02-24 LAB — COMPREHENSIVE METABOLIC PANEL WITH GFR
ALT: 48 U/L — ABNORMAL HIGH (ref 0–44)
AST: 62 U/L — ABNORMAL HIGH (ref 15–41)
Albumin: 2.8 g/dL — ABNORMAL LOW (ref 3.5–5.0)
Alkaline Phosphatase: 73 U/L (ref 38–126)
Anion gap: 15 (ref 5–15)
BUN: 50 mg/dL — ABNORMAL HIGH (ref 8–23)
CO2: 22 mmol/L (ref 22–32)
Calcium: 8.5 mg/dL — ABNORMAL LOW (ref 8.9–10.3)
Chloride: 103 mmol/L (ref 98–111)
Creatinine, Ser: 2.02 mg/dL — ABNORMAL HIGH (ref 0.61–1.24)
GFR calc Af Amer: 36 mL/min — ABNORMAL LOW
GFR calc non Af Amer: 31 mL/min — ABNORMAL LOW
Glucose, Bld: 235 mg/dL — ABNORMAL HIGH (ref 70–99)
Potassium: 4.1 mmol/L (ref 3.5–5.1)
Sodium: 140 mmol/L (ref 135–145)
Total Bilirubin: 0.3 mg/dL (ref 0.3–1.2)
Total Protein: 6.1 g/dL — ABNORMAL LOW (ref 6.5–8.1)

## 2019-02-24 LAB — POCT I-STAT 7, (LYTES, BLD GAS, ICA,H+H)
Acid-base deficit: 2 mmol/L (ref 0.0–2.0)
Bicarbonate: 22.5 mmol/L (ref 20.0–28.0)
Calcium, Ion: 1.32 mmol/L (ref 1.15–1.40)
HCT: 37 % — ABNORMAL LOW (ref 39.0–52.0)
Hemoglobin: 12.6 g/dL — ABNORMAL LOW (ref 13.0–17.0)
O2 Saturation: 90 %
Patient temperature: 98.4
Potassium: 4 mmol/L (ref 3.5–5.1)
Sodium: 147 mmol/L — ABNORMAL HIGH (ref 135–145)
TCO2: 24 mmol/L (ref 22–32)
pCO2 arterial: 36.5 mmHg (ref 32.0–48.0)
pH, Arterial: 7.398 (ref 7.350–7.450)
pO2, Arterial: 57 mmHg — ABNORMAL LOW (ref 83.0–108.0)

## 2019-02-24 LAB — GLUCOSE, CAPILLARY
Glucose-Capillary: 287 mg/dL — ABNORMAL HIGH (ref 70–99)
Glucose-Capillary: 253 mg/dL — ABNORMAL HIGH (ref 70–99)
Glucose-Capillary: 167 mg/dL — ABNORMAL HIGH (ref 70–99)
Glucose-Capillary: 136 mg/dL — ABNORMAL HIGH (ref 70–99)

## 2019-02-24 LAB — LIPID PANEL
Cholesterol: 133 mg/dL (ref 0–200)
HDL: 23 mg/dL — ABNORMAL LOW
LDL Cholesterol: 75 mg/dL (ref 0–99)
Total CHOL/HDL Ratio: 5.8 ratio
Triglycerides: 173 mg/dL — ABNORMAL HIGH
VLDL: 35 mg/dL (ref 0–40)

## 2019-02-24 LAB — HEMOGLOBIN A1C
Hgb A1c MFr Bld: 8.5 % — ABNORMAL HIGH (ref 4.8–5.6)
Mean Plasma Glucose: 197.25 mg/dL

## 2019-02-24 LAB — BASIC METABOLIC PANEL
Anion gap: 14 (ref 5–15)
BUN: 48 mg/dL — ABNORMAL HIGH (ref 8–23)
CO2: 20 mmol/L — ABNORMAL LOW (ref 22–32)
Calcium: 9.4 mg/dL (ref 8.9–10.3)
Chloride: 110 mmol/L (ref 98–111)
Creatinine, Ser: 1.89 mg/dL — ABNORMAL HIGH (ref 0.61–1.24)
GFR calc Af Amer: 39 mL/min — ABNORMAL LOW (ref >60)
GFR calc non Af Amer: 34 mL/min — ABNORMAL LOW (ref >60)
Glucose, Bld: 269 mg/dL — ABNORMAL HIGH (ref 70–99)
Potassium: 4.3 mmol/L (ref 3.5–5.1)
Sodium: 144 mmol/L (ref 135–145)

## 2019-02-24 LAB — D-DIMER, QUANTITATIVE: D-Dimer, Quant: 0.9 ug{FEU}/mL — ABNORMAL HIGH (ref 0.00–0.50)

## 2019-02-24 LAB — MAGNESIUM
Magnesium: 1.9 mg/dL (ref 1.7–2.4)
Magnesium: 2 mg/dL (ref 1.7–2.4)

## 2019-02-24 LAB — PHOSPHORUS
Phosphorus: 2.8 mg/dL (ref 2.5–4.6)
Phosphorus: 3.5 mg/dL (ref 2.5–4.6)

## 2019-02-24 LAB — FERRITIN: Ferritin: 448 ng/mL — ABNORMAL HIGH (ref 24–336)

## 2019-02-24 LAB — C-REACTIVE PROTEIN: CRP: 1.6 mg/dL — ABNORMAL HIGH

## 2019-02-24 MED ORDER — INSULIN GLARGINE 100 UNIT/ML ~~LOC~~ SOLN
60.0000 [IU] | Freq: Two times a day (BID) | SUBCUTANEOUS | Status: DC
  Administered 2019-02-24 – 2019-02-28 (×8): 60 [IU] via SUBCUTANEOUS
  Filled 2019-02-24 (×9): qty 0.6

## 2019-02-24 MED ORDER — INSULIN ASPART 100 UNIT/ML ~~LOC~~ SOLN
12.0000 [IU] | Freq: Three times a day (TID) | SUBCUTANEOUS | Status: DC
  Administered 2019-02-24: 12 [IU] via SUBCUTANEOUS

## 2019-02-24 MED ORDER — MORPHINE SULFATE (PF) 2 MG/ML IV SOLN
2.0000 mg | Freq: Once | INTRAVENOUS | Status: AC
  Administered 2019-02-24: 17:00:00 2 mg via INTRAVENOUS

## 2019-02-24 MED ORDER — LORAZEPAM 2 MG/ML IJ SOLN
2.0000 mg | Freq: Once | INTRAMUSCULAR | Status: AC
  Administered 2019-02-24: 2 mg via INTRAVENOUS
  Filled 2019-02-24: qty 1

## 2019-02-24 MED ORDER — ALBUMIN HUMAN 25 % IV SOLN
25.0000 g | Freq: Once | INTRAVENOUS | Status: AC
  Administered 2019-02-24: 15:00:00 25 g via INTRAVENOUS
  Filled 2019-02-24: qty 50

## 2019-02-24 MED ORDER — ATORVASTATIN CALCIUM 40 MG PO TABS: 40.0000 mg | ORAL_TABLET | Freq: Every day | ORAL | Status: DC

## 2019-02-24 MED ORDER — FUROSEMIDE 10 MG/ML IJ SOLN: 60.0000 mg | Freq: Once | INTRAMUSCULAR | Status: DC

## 2019-02-24 MED ORDER — FUROSEMIDE 10 MG/ML IJ SOLN
60.0000 mg | Freq: Once | INTRAMUSCULAR | Status: AC
  Administered 2019-02-24: 60 mg via INTRAVENOUS
  Filled 2019-02-24: qty 6

## 2019-02-24 MED ORDER — MORPHINE SULFATE (PF) 4 MG/ML IV SOLN
INTRAVENOUS | Status: AC
  Filled 2019-02-24: qty 1

## 2019-02-24 MED ORDER — HEPARIN SODIUM (PORCINE) 5000 UNIT/ML IJ SOLN
7500.0000 [IU] | Freq: Three times a day (TID) | INTRAMUSCULAR | Status: DC
  Administered 2019-02-24 (×2): 7500 [IU] via SUBCUTANEOUS
  Filled 2019-02-24 (×2): qty 2

## 2019-02-24 NOTE — Progress Notes (Signed)
1625: Pt called for RN assist. Pt observed in tripod position, labored breathing and tachypneic 32RR/min. 02@78 % on 7L HFNC. Accessory muscle and mouth breathing observed, NRB @15  placed. Pt anxious and restless, air hunger noted. RT and Rapid response initiated. Charge RN, Woods,MD  notified and aware.   1626: Telemetry monitoring staff alerted this RN that pt had 7 beats non-sustained V-tach, MD notified and aware.   1627: RT, rapid response team member and support RN bedside. Pt continues w/restlessness/anxiety/tremors. Bronchodilators administered  1635: V.O for medications received.

## 2019-02-24 NOTE — Progress Notes (Signed)
Responded to Rapid Response. Pt on 15L [place] HF and 15L NRB. RR 50 Combivent given. EKG done. Dr. Sherral Hammers at bedside.

## 2019-02-24 NOTE — Plan of Care (Signed)
  Problem: Education: Goal: Knowledge of risk factors and measures for prevention of condition will improve Outcome: Progressing   Problem: Coping: Goal: Psychosocial and spiritual needs will be supported Outcome: Progressing   Problem: Respiratory: Goal: Will maintain a patent airway Outcome: Progressing Goal: Complications related to the disease process, condition or treatment will be avoided or minimized Outcome: Progressing   

## 2019-02-24 NOTE — Progress Notes (Signed)
Patient transferred to 1C 164, placed on HHFNC 40L 100%, NRB.

## 2019-02-24 NOTE — Progress Notes (Signed)
Re-evaluation after RRT today:  Patient in bed, confused but following commands.  Hypoxic, oxygen sats =79 on NRB + HFNC.  Assisted patient to prone/lateral position with improvement in sats to 91%.  RR still 30-32/min with accessory muscle use. No current chest pain complaints.

## 2019-02-24 NOTE — Progress Notes (Signed)
Pt got out of bed unassisted, orientation is disoriented x3, pt managed to pull out IV and pulling at 02 and tele leads. MD notified. 02 maintaining 92% on HFNC and NRB at this time. Awaiting further orders from MD, Will report off to oncoming RN. Nothing further to note at this time.

## 2019-02-24 NOTE — Progress Notes (Addendum)
PROGRESS NOTE    Paul [name]  [id number] DOB: [birth year] DOA: 03/10/2019 PCP: Marin Olp, MD   Brief Narrative:  74 y.o. WM PMHx Diastolic CHF, CKD stage III, DM type 2 uncontrolled with complication, HTN, OSA on CPAP (does not always use)  Presents with myalgias, generalized weakness for the last several days.  He has not had any nausea, vomiting, abdominal pain.  Is been increasingly weak and fatigued.  Denies any cough.  He has been having fevers and was noted to be hypoxic when pulse ox was checked at home.  He was brought to the ER for evaluation.  He reports that he has been compliant with his medications.  ED Course: Patient's Covid test returned positive.  Chest x-ray indicated multifocal pneumonia.  Creatinine was elevated above baseline at 2.3.  Baseline creatinine 1.7.  Heart rate was noted to be in the low 50s, blood pressure in the low 100s.  Inflammatory markers were checked and noted to be mildly elevated.  Procalcitonin was negative. He was started on intravenous steroids and referred for admission.   Subjective: 2/6 afebrile overnight A/O x4, positive S OB.  Patient concerned that his respiratory status has worsened. ADDENDUM; patient bedside patient having increased WOB/SOB + chest pain.    Assessment & Plan:   Active Problems:   Type 2 diabetes, controlled, with renal manifestation (HCC)   Hypertension associated with diabetes ([place])   Chronic diastolic heart failure (HCC)   CKD (chronic kidney disease), stage III ([place])   Pneumonia due to COVID-19 virus   Acute respiratory failure with hypoxia (HCC)   AKI (acute kidney injury) ([place])   Obese   Covid pneumonia/acute respiratory failure with hypoxia COVID-19 Labs  Recent Labs    03/14/2019 1225 02/23/19 0000 02/24/19 0208  DDIMER 1.04* 0.86* 0.90*  FERRITIN 510* 473* 448*  LDH 315*  --   --   CRP 1.7* 1.9* 1.6*    Lab Results  Component Value Date   SARSCOV2NAA POSITIVE (A) 03/03/2019   SARSCOV2NAA RESULT:  NEGATIVE 12/13/2018   -Decadron 6 mg daily -Remdesivir per pharmacy protocol (end date 2/8) -Combivent -Vitamins per Covid protocol -Incentive spirometry -Flutter valve -2/6 although patient's respiratory status has worsened does not meet guidelines for Actemra or Covid convalescent plasma. -2/6 PCXR; worsening groundglass opacification see results below-Possible mild congestion (my read); albumin 25 g x 1  Diastolic CHF -Strict ins and outs +4.7 L -Daily weight -Clonidine 0.1 mg BID -Diltiazem 360 mg daily -Hold on all her home HTN meds, including diuretic.  Patient does not appear fluid overloaded at this point.  At some point may need to restart  Essential HTN -See CHF  Chest pain/S OB -EKG no significant change when compared to EKG from 02/21/2019.  RBBB, inferior infarct age undetermined, ST-T wave abnormality. -Trend troponin; pending -PCXR; pulmonary edema -Lasix IV 60 mg x 1  Acute on CKD stage III (baseline Cr 1.7) Recent Labs  Lab 03/03/2019 1225 02/23/19 0000 02/24/19 0208  CREATININE 2.30* 2.54* 2.02*  -2/6 given patient's increased SOB will decrease normal saline KVO  Diabetes type 2 uncontrolled with complication -[id number] Hemoglobin A1c= 7.3 -2/6 increase Lantus 60 units BID -2/6 increase NovoLog 12 units qac  -Resistant SSI -Diabetic diet; nursing to ensure             NO complex carbohydrates             NO fruit drinks  NO peanut butter, apple sauce             NO sweet fruit  HLD -2/6 LDL= 75 -Increase Lipitor 40 mg daily  Obese (BMI 38.6 kg/m) -Nutrition consult    DVT prophylaxis: Subcu heparin Code Status: Full Family Communication: 2/6 spoke with [name] (wife) counseled on plan of care answered all questions  Disposition Plan:    Consultants:    Procedures/Significant Events:  2/6 PCXR;-interstitial markings are diffusely coarsened with chronic features.  -Patchy bilateral ground-glass opacities  are noted bilaterally, Lt>>Rt , progressive in the interval. 2/6 PCXR (1727);-moderate to marked severity infiltrates are seen involving predominantly the bilateral upper lobes and, to a lesser degree, the bilateral lung bases. This is increased in severity when compared to the prior study.    I have personally reviewed and interpreted all radiology studies and my findings are as above.  VENTILATOR SETTINGS: Nasal cannula 2/6 Flow; 6 L/min SPO2; 88%   Cultures   Antimicrobials: Anti-infectives (From admission, onward)   Start     Dose/Rate Stop   02/23/19 1000  remdesivir 100 mg in sodium chloride 0.9 % 100 mL IVPB  Status:  Discontinued     100 mg 200 mL/hr over 30 Minutes 03/07/2019 2026   02/23/19 1000  remdesivir 100 mg in sodium chloride 0.9 % 100 mL IVPB     100 mg 200 mL/hr over 30 Minutes 02/27/19 0959   03/07/2019 2023  remdesivir 200 mg in sodium chloride 0.9% 250 mL IVPB  Status:  Discontinued     200 mg 580 mL/hr over 30 Minutes 03/02/2019 2026   02/27/2019 1900  remdesivir 200 mg in sodium chloride 0.9% 250 mL IVPB     200 mg 580 mL/hr over 30 Minutes 03/10/2019 2334       Devices    LINES / TUBES:      Continuous Infusions: . sodium chloride 100 mL/hr at 02/24/19 0724  . remdesivir 100 mg in NS 100 mL 100 mg (02/24/19 0843)     Objective: Vitals:   02/24/19 0503 02/24/19 0735 02/24/19 0749 02/24/19 0756  BP: (!) 173/80 (!) 163/78    Pulse: 60 69 66 64  Resp: 20 (!) 24 (!) 22 20  Temp: 98.1 F (36.7 C)     TempSrc: Oral     SpO2: 92% (!) 87% (!) 88% (!) 89%  Weight:      Height:        Intake/Output Summary (Last 24 hours) at 02/24/2019 1021 Last data filed at 02/24/2019 0500 Gross per 24 hour  Intake 5810 ml  Output 1950 ml  Net 3860 ml   Filed Weights   02/24/2019 1221 02/23/19 1115 02/24/19 0500  Weight: 102 kg 99.8 kg 99.7 kg   Physical Exam:  General: A/O x4, positive acute respiratory distress Eyes: negative scleral hemorrhage, negative  anisocoria, negative icterus ENT: Negative Runny nose, negative gingival bleeding, Neck:  Negative scars, masses, torticollis, lymphadenopathy, JVD Lungs: Clear to auscultation bilaterally without wheezes or crackles Cardiovascular: Regular rate and rhythm without murmur gallop or rub normal S1 and S2 Abdomen: negative abdominal pain, nondistended, positive soft, bowel sounds, no rebound, no ascites, no appreciable mass Extremities: No significant cyanosis, clubbing, or edema bilateral lower extremities Skin: Negative rashes, lesions, ulcers Psychiatric:  Negative depression, negative anxiety, negative fatigue, negative mania  Central nervous system:  Cranial nerves II through XII intact, tongue/uvula midline, all extremities muscle strength 5/5, sensation intact throughout, negative dysarthria, negative expressive aphasia, negative receptive  aphasia. .     Data Reviewed: Care during the described time interval was provided by me .  I have reviewed this patient's available data, including medical history, events of note, physical examination, and all test results as part of my evaluation.   CBC: Recent Labs  Lab 02/26/2019 1225 02/23/19 0000 02/24/19 0208  WBC 4.7 3.1* 14.3*  NEUTROABS 3.3 2.4 12.7*  HGB 14.2 12.2* 13.0  HCT 44.1 37.8* 39.0  MCV 92.3 91.1 90.9  PLT 155 135* [id number]   Basic Metabolic Panel: Recent Labs  Lab 03/15/2019 1225 02/23/19 0000 02/24/19 0208  NA 138 136 140  K 3.6 4.0 4.1  CL 98 101 103  CO2 27 23 22   GLUCOSE 110* 313* 235*  BUN 31* 47* 50*  CREATININE 2.30* 2.54* 2.02*  CALCIUM 8.9 8.6* 8.5*  MG  --   --  1.9  PHOS  --   --  2.8   GFR: Estimated Creatinine Clearance: 33.7 mL/min (A) (by C-G formula based on SCr of 2.02 mg/dL (H)). Liver Function Tests: Recent Labs  Lab 03/08/2019 1225 02/23/19 0000 02/24/19 0208  AST 78* 59* 62*  ALT 61* 50* 48*  ALKPHOS 84 72 73  BILITOT 0.8 0.8 0.3  PROT 7.0 6.3* 6.1*  ALBUMIN 3.4* 3.0* 2.8*   No results  for input(s): LIPASE, AMYLASE in the last 168 hours. No results for input(s): AMMONIA in the last 168 hours. Coagulation Profile: No results for input(s): INR, PROTIME in the last 168 hours. Cardiac Enzymes: No results for input(s): CKTOTAL, CKMB, CKMBINDEX, TROPONINI in the last 168 hours. BNP (last 3 results) No results for input(s): PROBNP in the last 8760 hours. HbA1C: Recent Labs    02/24/19 0208  HGBA1C 8.5*   CBG: Recent Labs  Lab 02/24/2019 2045 02/23/19 0732 02/23/19 1146 02/23/19 1549 02/23/19 1945  GLUCAP 212* 274* 311* 306* 256*   Lipid Profile: Recent Labs    02/23/2019 1225 02/24/19 0208  CHOL  --  133  HDL  --  23*  LDLCALC  --  75  TRIG 151* 173*  CHOLHDL  --  5.8   Thyroid Function Tests: No results for input(s): TSH, T4TOTAL, FREET4, T3FREE, THYROIDAB in the last 72 hours. Anemia Panel: Recent Labs    02/23/19 0000 02/24/19 0208  FERRITIN 473* 448*   Urine analysis:    Component Value Date/Time   BILIRUBINUR Negative 09/05/2017 1009   PROTEINUR Positive (A) 09/05/2017 1009   UROBILINOGEN 0.2 09/05/2017 1009   NITRITE Negative 09/05/2017 1009   LEUKOCYTESUR Negative 09/05/2017 1009   Sepsis Labs: @LABRCNTIP (procalcitonin:4,lacticidven:4)  ) Recent Results (from the past 240 hour(s))  Blood Culture (routine x 2)     Status: None (Preliminary result)   Collection Time: 02/21/2019 12:15 PM   Specimen: BLOOD  Result Value Ref Range Status   Specimen Description BLOOD LEFT ANTECUBITAL  Final   Special Requests   Final    BOTTLES DRAWN AEROBIC AND ANAEROBIC Blood Culture adequate volume   Culture   Final    NO GROWTH 2 DAYS Performed at [facility], [place] [address], [place], [place] [address]    Report Status PENDING  Incomplete  Respiratory Panel by RT PCR (Flu A&B, Covid) - Nasopharyngeal Swab     Status: Abnormal   Collection Time: 02/21/2019 12:26 PM   Specimen: Nasopharyngeal Swab  Result Value Ref Range Status   SARS Coronavirus  2 by RT PCR POSITIVE (A) NEGATIVE Final    Comment: RESULT  CALLED TO, READ BACK BY AND VERIFIED WITH: Bennie Hind RN 15:10 03/14/2019 ([email or web address]) (NOTE) SARS-CoV-2 target nucleic acids are DETECTED. SARS-CoV-2 RNA is generally detectable in upper respiratory specimens  during the acute phase of infection. Positive results are indicative of the presence of the identified virus, but do not rule out bacterial infection or co-infection with other pathogens not detected by the test. Clinical correlation with patient history and other diagnostic information is necessary to determine patient infection status. The expected result is Negative. Fact Sheet for Patients:  PinkCheek.be Fact Sheet for Healthcare Providers: GravelBags.it This test is not yet approved or cleared by the [place] FDA and  has been authorized for detection and/or diagnosis of SARS-CoV-2 by FDA under an Emergency Use Authorization (EUA).  This EUA will remain in effect (meaning this test can be used)  for the duration of  the COVID-19 declaration under Section 564(b)(1) of the Act, 21 U.S.C. section 360bbb-3(b)(1), unless the authorization is terminated or revoked sooner.    Influenza A by PCR NEGATIVE NEGATIVE Final   Influenza B by PCR NEGATIVE NEGATIVE Final    Comment: (NOTE) The Xpert Xpress SARS-CoV-2/FLU/RSV assay is intended as an aid in  the diagnosis of influenza from Nasopharyngeal swab specimens and  should not be used as a sole basis for treatment. Nasal washings and  aspirates are unacceptable for Xpert Xpress SARS-CoV-2/FLU/RSV  testing. Fact Sheet for Patients: PinkCheek.be Fact Sheet for Healthcare Providers: GravelBags.it This test is not yet approved or cleared by the [place] FDA and  has been authorized for detection and/or diagnosis of SARS-CoV-2 by  FDA under an Emergency Use  Authorization (EUA). This EUA will remain  in effect (meaning this test can be used) for the duration of the  Covid-19 declaration under Section 564(b)(1) of the Act, 21  U.S.C. section 360bbb-3(b)(1), unless the authorization is  terminated or revoked. Performed at [facility], [place] [address] [place], [place] [address]   Blood Culture (routine x 2)     Status: None (Preliminary result)   Collection Time: 03/07/2019 12:50 PM   Specimen: BLOOD RIGHT ARM  Result Value Ref Range Status   Specimen Description BLOOD RIGHT ARM  Final   Special Requests   Final    BOTTLES DRAWN AEROBIC AND ANAEROBIC Blood Culture adequate volume   Culture   Final    NO GROWTH 2 DAYS Performed at [facility], [place] [address], [place], [place] [address]    Report Status PENDING  Incomplete         Radiology Studies: DG Chest Port 1 View  Result Date: 02/27/2019 CLINICAL DATA:  COVID. EXAM: PORTABLE CHEST 1 VIEW COMPARISON:  No prior. FINDINGS: Borderline cardiomegaly. No pulmonary venous congestion. Bilateral diffuse interstitial prominence noted. Interstitial edema and/or pneumonitis could present this fashion. Tiny bilateral pleural effusions cannot be excluded. No pneumothorax. IMPRESSION: 1.  Borderline cardiomegaly. 2. Bilateral diffuse interstitial prominence noted. Interstitial edema and/or pneumonitis could present this fashion. Tiny bilateral pleural effusions cannot be excluded. Electronically Signed   By: [place]   On: 02/23/2019 12:54        Scheduled Meds: . vitamin C  500 mg Oral Daily  . atorvastatin  20 mg Oral Daily  . cloNIDine  0.1 mg Oral BID  . dexamethasone (DECADRON) injection  6 mg Intravenous Q24H  . diltiazem  360 mg Oral Daily  . heparin  5,000 Units Subcutaneous Q8H  . insulin aspart  0-20 Units Subcutaneous  TID WC  . insulin aspart  0-5 Units Subcutaneous QHS  . insulin aspart  8 Units Subcutaneous TID WC  . insulin glargine  55 Units Subcutaneous  BID  . Ipratropium-Albuterol  1 puff Inhalation QID  . linagliptin  5 mg Oral Daily  . PARoxetine  20 mg Oral Daily  . zinc sulfate  220 mg Oral Daily   Continuous Infusions: . sodium chloride 100 mL/hr at 02/24/19 0724  . remdesivir 100 mg in NS 100 mL 100 mg (02/24/19 0843)     LOS: 2 days   The patient is critically ill with multiple organ systems failure and requires high complexity decision making for assessment and support, frequent evaluation and titration of therapies, application of advanced monitoring technologies and extensive interpretation of multiple databases. Critical Care Time devoted to patient care services described in this note  Time spent: 40 minutes     Nyheem Binette, Geraldo Docker, MD [facility] Pager [phone]  If 7PM-7AM, please contact night-coverage [email or web address] Password [facility] 02/24/2019, 10:21 AM

## 2019-02-24 NOTE — Progress Notes (Signed)
Paul Hammers, MD bedside for further eval and tx

## 2019-02-25 ENCOUNTER — Inpatient Hospital Stay (HOSPITAL_COMMUNITY): Payer: Medicare Other

## 2019-02-25 DIAGNOSIS — J9601 Acute respiratory failure with hypoxia: Secondary | ICD-10-CM

## 2019-02-25 DIAGNOSIS — J80 Acute respiratory distress syndrome: Secondary | ICD-10-CM

## 2019-02-25 DIAGNOSIS — J1282 Pneumonia due to coronavirus disease 2019: Secondary | ICD-10-CM

## 2019-02-25 DIAGNOSIS — J9600 Acute respiratory failure, unspecified whether with hypoxia or hypercapnia: Secondary | ICD-10-CM

## 2019-02-25 DIAGNOSIS — U071 COVID-19: Principal | ICD-10-CM

## 2019-02-25 DIAGNOSIS — J9311 Primary spontaneous pneumothorax: Secondary | ICD-10-CM

## 2019-02-25 LAB — POCT I-STAT 7, (LYTES, BLD GAS, ICA,H+H)
Acid-base deficit: 1 mmol/L (ref 0.0–2.0)
Acid-base deficit: 2 mmol/L (ref 0.0–2.0)
Bicarbonate: 28.6 mmol/L — ABNORMAL HIGH (ref 20.0–28.0)
Bicarbonate: 26.1 mmol/L (ref 20.0–28.0)
Calcium, Ion: 1.34 mmol/L (ref 1.15–1.40)
Calcium, Ion: 1.29 mmol/L (ref 1.15–1.40)
HCT: 39 % (ref 39.0–52.0)
HCT: 38 % — ABNORMAL LOW (ref 39.0–52.0)
Hemoglobin: 13.3 g/dL (ref 13.0–17.0)
Hemoglobin: 12.9 g/dL — ABNORMAL LOW (ref 13.0–17.0)
O2 Saturation: 98 %
O2 Saturation: 91 %
Patient temperature: 98.5
Potassium: 4.5 mmol/L (ref 3.5–5.1)
Potassium: 4.7 mmol/L (ref 3.5–5.1)
Sodium: 147 mmol/L — ABNORMAL HIGH (ref 135–145)
Sodium: 148 mmol/L — ABNORMAL HIGH (ref 135–145)
TCO2: 31 mmol/L (ref 22–32)
TCO2: 28 mmol/L (ref 22–32)
pCO2 arterial: 68.7 mmHg (ref 32.0–48.0)
pCO2 arterial: 58.6 mmHg — ABNORMAL HIGH (ref 32.0–48.0)
pH, Arterial: 7.227 — ABNORMAL LOW (ref 7.350–7.450)
pH, Arterial: 7.257 — ABNORMAL LOW (ref 7.350–7.450)
pO2, Arterial: 123 mmHg — ABNORMAL HIGH (ref 83.0–108.0)
pO2, Arterial: 73 mmHg — ABNORMAL LOW (ref 83.0–108.0)

## 2019-02-25 LAB — CBC WITH DIFFERENTIAL/PLATELET
Abs Immature Granulocytes: 0.39 10*3/uL — ABNORMAL HIGH (ref 0.00–0.07)
Basophils Absolute: 0 10*3/uL (ref 0.0–0.1)
Basophils Relative: 0 %
Eosinophils Absolute: 0 10*3/uL (ref 0.0–0.5)
Eosinophils Relative: 0 %
HCT: 43.8 % (ref 39.0–52.0)
Hemoglobin: 13.9 g/dL (ref 13.0–17.0)
Immature Granulocytes: 2 %
Lymphocytes Relative: 4 %
Lymphs Abs: 0.7 10*3/uL (ref 0.7–4.0)
MCH: 29.6 pg (ref 26.0–34.0)
MCHC: 31.7 g/dL (ref 30.0–36.0)
MCV: 93.4 fL (ref 80.0–100.0)
Monocytes Absolute: 0.9 10*3/uL (ref 0.1–1.0)
Monocytes Relative: 5 %
Neutro Abs: 17.4 10*3/uL — ABNORMAL HIGH (ref 1.7–7.7)
Neutrophils Relative %: 89 %
Platelets: 221 10*3/uL (ref 150–400)
RBC: 4.69 MIL/uL (ref 4.22–5.81)
RDW: 14.5 % (ref 11.5–15.5)
WBC: 19.5 10*3/uL — ABNORMAL HIGH (ref 4.0–10.5)
nRBC: 0 % (ref 0.0–0.2)

## 2019-02-25 LAB — PHOSPHORUS: Phosphorus: 4.7 mg/dL — ABNORMAL HIGH (ref 2.5–4.6)

## 2019-02-25 LAB — C-REACTIVE PROTEIN: CRP: 1 mg/dL — ABNORMAL HIGH (ref <1.0)

## 2019-02-25 LAB — MAGNESIUM: Magnesium: 2.1 mg/dL (ref 1.7–2.4)

## 2019-02-25 LAB — COMPREHENSIVE METABOLIC PANEL
ALT: 79 U/L — ABNORMAL HIGH (ref 0–44)
AST: 115 U/L — ABNORMAL HIGH (ref 15–41)
Albumin: 3.3 g/dL — ABNORMAL LOW (ref 3.5–5.0)
Alkaline Phosphatase: 95 U/L (ref 38–126)
Anion gap: 11 (ref 5–15)
BUN: 50 mg/dL — ABNORMAL HIGH (ref 8–23)
CO2: 26 mmol/L (ref 22–32)
Calcium: 9.1 mg/dL (ref 8.9–10.3)
Chloride: 113 mmol/L — ABNORMAL HIGH (ref 98–111)
Creatinine, Ser: 1.87 mg/dL — ABNORMAL HIGH (ref 0.61–1.24)
GFR calc Af Amer: 40 mL/min — ABNORMAL LOW (ref >60)
GFR calc non Af Amer: 34 mL/min — ABNORMAL LOW (ref >60)
Glucose, Bld: 81 mg/dL (ref 70–99)
Potassium: 4.7 mmol/L (ref 3.5–5.1)
Sodium: 150 mmol/L — ABNORMAL HIGH (ref 135–145)
Total Bilirubin: 1.1 mg/dL (ref 0.3–1.2)
Total Protein: 6.8 g/dL (ref 6.5–8.1)

## 2019-02-25 LAB — GLUCOSE, CAPILLARY
Glucose-Capillary: 85 mg/dL (ref 70–99)
Glucose-Capillary: 94 mg/dL (ref 70–99)
Glucose-Capillary: 87 mg/dL (ref 70–99)
Glucose-Capillary: 74 mg/dL (ref 70–99)
Glucose-Capillary: 84 mg/dL (ref 70–99)

## 2019-02-25 LAB — D-DIMER, QUANTITATIVE: D-Dimer, Quant: 0.93 ug/mL-FEU — ABNORMAL HIGH (ref 0.00–0.50)

## 2019-02-25 LAB — FERRITIN: Ferritin: 551 ng/mL — ABNORMAL HIGH (ref 24–336)

## 2019-02-25 LAB — MRSA PCR SCREENING: MRSA by PCR: NEGATIVE

## 2019-02-25 MED ORDER — MIDAZOLAM HCL 2 MG/2ML IJ SOLN
INTRAMUSCULAR | Status: AC
  Administered 2019-02-25: 2 mg
  Filled 2019-02-25: qty 4

## 2019-02-25 MED ORDER — FENTANYL 2500MCG IN NS 250ML (10MCG/ML) PREMIX INFUSION: 25.0000 ug/h | INTRAVENOUS | Status: DC

## 2019-02-25 MED ORDER — ASCORBIC ACID 500 MG PO TABS
500.0000 mg | ORAL_TABLET | Freq: Every day | ORAL | Status: DC
  Administered 2019-02-25 – 2019-03-01 (×5): 500 mg
  Filled 2019-02-25 (×5): qty 1

## 2019-02-25 MED ORDER — PROPOFOL 10 MG/ML IV BOLUS
INTRAVENOUS | Status: AC
  Filled 2019-02-25: qty 20

## 2019-02-25 MED ORDER — VITAL HIGH PROTEIN PO LIQD
1000.0000 mL | ORAL | Status: DC
  Administered 2019-02-25: 1000 mL

## 2019-02-25 MED ORDER — FENTANYL CITRATE (PF) 100 MCG/2ML IJ SOLN
INTRAMUSCULAR | Status: AC
  Administered 2019-02-25: 03:00:00 100 ug
  Filled 2019-02-25: qty 2

## 2019-02-25 MED ORDER — ETOMIDATE 2 MG/ML IV SOLN: 20.0000 mg | Freq: Once | INTRAVENOUS | Status: DC

## 2019-02-25 MED ORDER — MIDAZOLAM BOLUS VIA INFUSION
1.0000 mg | INTRAVENOUS | Status: DC | PRN
  Filled 2019-02-25: qty 2

## 2019-02-25 MED ORDER — DILTIAZEM HCL 60 MG PO TABS
60.0000 mg | ORAL_TABLET | Freq: Three times a day (TID) | ORAL | Status: DC
  Administered 2019-02-25 (×2): 60 mg
  Filled 2019-02-25 (×6): qty 1

## 2019-02-25 MED ORDER — LIDOCAINE HCL 1 % IJ SOLN
10.0000 mL | Freq: Once | INTRAMUSCULAR | Status: AC
  Administered 2019-02-25: 10 mL via INTRADERMAL
  Filled 2019-02-25: qty 10

## 2019-02-25 MED ORDER — FENTANYL CITRATE (PF) 100 MCG/2ML IJ SOLN: 25.0000 ug | Freq: Once | INTRAMUSCULAR | Status: DC

## 2019-02-25 MED ORDER — NOREPINEPHRINE 4 MG/250ML-% IV SOLN
2.0000 ug/min | INTRAVENOUS | Status: DC
  Administered 2019-02-25 – 2019-02-28 (×2): 2 ug/min via INTRAVENOUS
  Filled 2019-02-25 (×2): qty 250

## 2019-02-25 MED ORDER — ZINC SULFATE 220 (50 ZN) MG PO CAPS
220.0000 mg | ORAL_CAPSULE | Freq: Every day | ORAL | Status: DC
  Administered 2019-02-25 – 2019-03-01 (×5): 220 mg
  Filled 2019-02-25 (×5): qty 1

## 2019-02-25 MED ORDER — FENTANYL CITRATE (PF) 100 MCG/2ML IJ SOLN: 50.0000 ug | Freq: Once | INTRAMUSCULAR | Status: DC

## 2019-02-25 MED ORDER — ARTIFICIAL TEARS OPHTHALMIC OINT
1.0000 "application " | TOPICAL_OINTMENT | Freq: Three times a day (TID) | OPHTHALMIC | Status: DC
  Administered 2019-02-25 – 2019-03-01 (×12): 1 via OPHTHALMIC
  Filled 2019-02-25 (×3): qty 3.5

## 2019-02-25 MED ORDER — SODIUM CHLORIDE 0.9 % IV SOLN
250.0000 mL | INTRAVENOUS | Status: DC
  Administered 2019-02-25: 250 mL via INTRAVENOUS

## 2019-02-25 MED ORDER — PRO-STAT SUGAR FREE PO LIQD
30.0000 mL | Freq: Two times a day (BID) | ORAL | Status: DC
  Administered 2019-02-25 – 2019-02-26 (×3): 30 mL
  Filled 2019-02-25 (×3): qty 30

## 2019-02-25 MED ORDER — ORAL CARE MOUTH RINSE
15.0000 mL | OROMUCOSAL | Status: DC
  Administered 2019-02-25 – 2019-03-01 (×44): 15 mL via OROMUCOSAL

## 2019-02-25 MED ORDER — VECURONIUM BROMIDE 10 MG IV SOLR: 10.0000 mg | Freq: Once | INTRAVENOUS | Status: AC

## 2019-02-25 MED ORDER — ROCURONIUM BROMIDE 10 MG/ML (PF) SYRINGE
PREFILLED_SYRINGE | INTRAVENOUS | Status: AC
  Filled 2019-02-25: qty 10

## 2019-02-25 MED ORDER — MIDAZOLAM 50MG/50ML (1MG/ML) PREMIX INFUSION
INTRAVENOUS | Status: AC
  Administered 2019-02-25: 2 mg/h via INTRAVENOUS
  Filled 2019-02-25: qty 50

## 2019-02-25 MED ORDER — PANTOPRAZOLE SODIUM 40 MG IV SOLR
40.0000 mg | INTRAVENOUS | Status: DC
  Administered 2019-02-25 – 2019-03-01 (×5): 40 mg via INTRAVENOUS
  Filled 2019-02-25 (×5): qty 40

## 2019-02-25 MED ORDER — CHLORHEXIDINE GLUCONATE 0.12% ORAL RINSE (MEDLINE KIT)
15.0000 mL | Freq: Two times a day (BID) | OROMUCOSAL | Status: DC
  Administered 2019-02-25 – 2019-03-01 (×9): 15 mL via OROMUCOSAL

## 2019-02-25 MED ORDER — ETOMIDATE 2 MG/ML IV SOLN
INTRAVENOUS | Status: AC
  Administered 2019-02-25: 20 mg
  Filled 2019-02-25: qty 20

## 2019-02-25 MED ORDER — FENTANYL CITRATE (PF) 2500 MCG/50ML IJ SOLN
25.0000 ug/h | INTRAMUSCULAR | Status: DC
  Administered 2019-02-25 (×2): 225 ug/h via INTRAVENOUS
  Administered 2019-02-26: 16:00:00 150 ug/h via INTRAVENOUS
  Administered 2019-02-26: 01:00:00 200 ug/h via INTRAVENOUS
  Administered 2019-02-27: 250 ug/h via INTRAVENOUS
  Administered 2019-02-28 – 2019-03-01 (×3): 200 ug/h via INTRAVENOUS
  Filled 2019-02-25 (×9): qty 50

## 2019-02-25 MED ORDER — CHLORHEXIDINE GLUCONATE CLOTH 2 % EX PADS
6.0000 | MEDICATED_PAD | Freq: Every day | CUTANEOUS | Status: DC
  Administered 2019-02-25 – 2019-03-01 (×6): 6 via TOPICAL

## 2019-02-25 MED ORDER — PAROXETINE HCL 20 MG PO TABS
20.0000 mg | ORAL_TABLET | Freq: Every day | ORAL | Status: DC
  Administered 2019-02-25 – 2019-03-01 (×5): 20 mg
  Filled 2019-02-25 (×5): qty 1

## 2019-02-25 MED ORDER — LINAGLIPTIN 5 MG PO TABS
5.0000 mg | ORAL_TABLET | Freq: Every day | ORAL | Status: DC
  Administered 2019-02-25 – 2019-03-01 (×5): 5 mg
  Filled 2019-02-25 (×5): qty 1

## 2019-02-25 MED ORDER — VECURONIUM BROMIDE 10 MG IV SOLR
INTRAVENOUS | Status: AC
  Administered 2019-02-25: 10 mg via INTRAVENOUS
  Filled 2019-02-25: qty 10

## 2019-02-25 MED ORDER — ENOXAPARIN SODIUM 60 MG/0.6ML ~~LOC~~ SOLN
50.0000 mg | Freq: Two times a day (BID) | SUBCUTANEOUS | Status: DC
  Administered 2019-02-25 – 2019-02-26 (×3): 50 mg via SUBCUTANEOUS
  Filled 2019-02-25 (×3): qty 0.6

## 2019-02-25 MED ORDER — CISATRACURIUM BOLUS VIA INFUSION
0.1000 mg/kg | Freq: Once | INTRAVENOUS | Status: AC
  Administered 2019-02-25: 10 mg via INTRAVENOUS
  Filled 2019-02-25: qty 10

## 2019-02-25 MED ORDER — SUCCINYLCHOLINE CHLORIDE 200 MG/10ML IV SOSY
PREFILLED_SYRINGE | INTRAVENOUS | Status: AC
  Filled 2019-02-25: qty 10

## 2019-02-25 MED ORDER — ETOMIDATE 2 MG/ML IV SOLN
20.0000 mg | Freq: Once | INTRAVENOUS | Status: AC
  Administered 2019-02-25: 20 mg via INTRAVENOUS

## 2019-02-25 MED ORDER — SODIUM CHLORIDE 0.9 % IV SOLN
0.0000 ug/kg/min | INTRAVENOUS | Status: DC
  Administered 2019-02-25: 10:00:00 3 ug/kg/min via INTRAVENOUS
  Administered 2019-02-25: 1.5 ug/kg/min via INTRAVENOUS
  Administered 2019-02-27: 1 ug/kg/min via INTRAVENOUS
  Filled 2019-02-25 (×4): qty 20

## 2019-02-25 MED ORDER — CLONIDINE HCL 0.1 MG PO TABS
0.1000 mg | ORAL_TABLET | Freq: Two times a day (BID) | ORAL | Status: DC
  Administered 2019-02-25: 0.1 mg
  Filled 2019-02-25: qty 1

## 2019-02-25 MED ORDER — INSULIN ASPART 100 UNIT/ML ~~LOC~~ SOLN
0.0000 [IU] | SUBCUTANEOUS | Status: DC
  Administered 2019-02-26: 3 [IU] via SUBCUTANEOUS
  Administered 2019-02-26: 4 [IU] via SUBCUTANEOUS
  Administered 2019-02-26: 20:00:00 7 [IU] via SUBCUTANEOUS
  Administered 2019-02-26: 16:00:00 4 [IU] via SUBCUTANEOUS
  Administered 2019-02-26 – 2019-02-27 (×2): 3 [IU] via SUBCUTANEOUS
  Administered 2019-02-27: 0 [IU] via SUBCUTANEOUS
  Administered 2019-02-27: 4 [IU] via SUBCUTANEOUS
  Administered 2019-02-27: 3 [IU] via SUBCUTANEOUS
  Administered 2019-02-27: 7 [IU] via SUBCUTANEOUS
  Administered 2019-02-28 (×2): 4 [IU] via SUBCUTANEOUS
  Administered 2019-02-28: 11 [IU] via SUBCUTANEOUS
  Administered 2019-03-01: 7 [IU] via SUBCUTANEOUS
  Administered 2019-03-01 (×3): 4 [IU] via SUBCUTANEOUS

## 2019-02-25 MED ORDER — FENTANYL BOLUS VIA INFUSION
25.0000 ug | INTRAVENOUS | Status: DC | PRN
  Filled 2019-02-25: qty 25

## 2019-02-25 MED ORDER — ATORVASTATIN CALCIUM 40 MG PO TABS
40.0000 mg | ORAL_TABLET | Freq: Every day | ORAL | Status: DC
  Administered 2019-02-25 – 2019-03-01 (×5): 40 mg
  Filled 2019-02-25 (×5): qty 1

## 2019-02-25 MED ORDER — MIDAZOLAM 50MG/50ML (1MG/ML) PREMIX INFUSION
0.0000 mg/h | INTRAVENOUS | Status: DC
  Administered 2019-02-25: 8 mg/h via INTRAVENOUS
  Administered 2019-02-25: 7 mg/h via INTRAVENOUS
  Administered 2019-02-26 (×2): 6 mg/h via INTRAVENOUS
  Administered 2019-02-26 – 2019-02-27 (×2): 7 mg/h via INTRAVENOUS
  Administered 2019-02-27 – 2019-03-01 (×5): 6 mg/h via INTRAVENOUS
  Filled 2019-02-25 (×13): qty 50

## 2019-02-25 MED ORDER — MIDAZOLAM HCL 2 MG/2ML IJ SOLN: 2.0000 mg | Freq: Once | INTRAMUSCULAR | Status: DC

## 2019-02-25 MED ORDER — BARICITINIB 2 MG PO TABS
2.0000 mg | ORAL_TABLET | Freq: Every day | ORAL | Status: DC
  Administered 2019-02-25 – 2019-02-26 (×2): 2 mg via ORAL
  Filled 2019-02-25 (×2): qty 1

## 2019-02-25 MED ORDER — STERILE WATER FOR INJECTION IJ SOLN
INTRAMUSCULAR | Status: AC
  Filled 2019-02-25: qty 10

## 2019-02-25 MED ORDER — FENTANYL 2500MCG IN NS 250ML (10MCG/ML) PREMIX INFUSION
INTRAVENOUS | Status: AC
  Administered 2019-02-25: 50 ug/h via INTRAVENOUS
  Filled 2019-02-25: qty 250

## 2019-02-25 NOTE — Procedures (Signed)
Intubation Procedure Note Paul [name] [id number] 01-23-1945  Procedure: Intubation Indications: Respiratory insufficiency  Procedure Details Consent: Risks of procedure as well as the alternatives and risks of each were explained to the (patient/caregiver).  Consent for procedure obtained. Time Out: Verified patient identification, verified procedure, site/side was marked, verified correct patient position, special equipment/implants available, medications/allergies/relevent history reviewed, required imaging and test results available.  Performed  Maximum sterile technique was used including cap, gloves, gown, hand hygiene and mask.   8.0 ET tube placed under DL with MAC 4 glide scope.  Tube placed without difficulty on first pass.  Positioning confirmed by direct visualization,ETCO2 and auscultation.  Noted to have large cuff leak, cuff would not consistently hold air.  For this reason the tube was changed to a 7.5 ET tube over a tube exchanger, again under direct visualization.  The second tube did maintain adequate cuff inflation   Evaluation Hemodynamic Status: BP stable throughout; O2 sats: transiently fell during during procedure and currently acceptable Patient's Current Condition: stable Complications: No apparent complications Patient did tolerate procedure well. Chest X-ray ordered to verify placement.  CXR: pending.   Baltazar Apo, MD, PhD 02/25/2019, 4:02 AM [facility] [phone] or if no answer [phone]

## 2019-02-25 NOTE — Progress Notes (Signed)
Notified wife susan and daughter. Questions and concerns addressed. No other issues at this time.

## 2019-02-25 NOTE — Progress Notes (Signed)
Chest x-ray pending.

## 2019-02-25 NOTE — Progress Notes (Signed)
Chest tube leak:  D: When attempting to prone patient, noticed large air leak on ventilator and 5+ air leak on chest tube seal.  A: Turned patient back to supine and reassess VS and attached to monitor.  Air leak resolved on CT and volume loss resolved on vent.  Contacted MDwith findings.  CXR ordered.  Patient sats 96% on 100% FiO2 when supined.  R: CXR reviewed and MD noted continuing pneumothorax.  Re-order CXR in 1 hour and assess if lung has inflated.

## 2019-02-25 NOTE — Consult Note (Addendum)
NAME:  Paul [name], MRN:  [id number], DOB:  [birth year], LOS: 3 ADMISSION DATE:  03/08/2019, CONSULTATION DATE: 02/25/2019 REFERRING MD: Dr. Vanita Ingles, TRH, CHIEF COMPLAINT: Acute respiratory failure  Brief History   74 year old man with hypertension, DM, CKD.  Progressive hypoxemic respiratory failure due to COVID-19 pneumonia.  Intubated 2/7.  Right pneumothorax, chest tube 2/7.   History of present illness   74 year old obese man with hypertension, diastolic CHF, type 2 diabetes, CKD stage III, OSA (not always compliant with CPAP), admitted on 03/02/2019 with fever, weakness/fatigue, myalgias and hypoxemic respiratory failure.  Chest x-ray showed bilateral interstitial infiltrates.  Treated with remdesivir, dexamethasone and supportive care.  Experienced progressive dyspnea, hypoxemia and worsening pulmonary infiltrates on chest x-ray, most recent afternoon 2/6.  FiO2 needs up titrated to 1.00 mask + +60 L/min HHFNC, but saturations remained mostly high 80s percent.  Began to experience progressive confusion, moved to ICU early 2/7.  GOC discussed with family by Dr. Vanita Ingles, deemed acceptable for escalation of his support.  He was sedated and intubated 2/7.  Post ventilation chest x-ray with large right pneumothorax, tube thoracostomy placed 2/7.  Past Medical History   Past Medical History:  Diagnosis Date  . Colon polyps 12/09/2009   Multiple in descending colon  . Depression   . Diabetes mellitus    type 2  . Diabetic retinopathy associated with controlled type 2 diabetes mellitus ([place]) 06/26/2018  . Diverticulosis 12/09/2009   Moderate  . Heart murmur   . Hyperlipidemia   . Hypertension   . melanoma dx'd 12/2012   rt arm; surg   . Microalbuminuria   . Sleep apnea    CPAP    Significant Hospital Events   Admission 2/4 Transfer to ICU with progressive infiltrates, hypoxia > intubated 2/7 Right pneumothorax 2/7  Consults:    Procedures:  ETT 2/7 >> Right chest tube 2/7  >>  Significant Diagnostic Tests:  Chest x-ray 2/6 reviewed by me, showed significant progression of bilateral interstitial and alveolar infiltrates Chest x-ray 2/7 reviewed by me, post intubation, large right pneumothorax  Micro Data:  SARSCoV2 2/4 >> positive Influenza A & B 2/4 >> negative Blood 2/4 >>  Respiratory 2/7 >>   Antimicrobials:  Remdesivir 2/4 >>   Interim history/subjective:  Intubated and sedated, hemodynamically stable  Objective   Blood pressure (!) 145/82, pulse 68, temperature 98.5 F (36.9 C), resp. rate (!) 24, height _0  (1.626 m), weight 99.7 kg, SpO2 (!) 85 %.    Vent Mode: PRVC FiO2 (%):  [80 %-100 %] 80 % Set Rate:  [24 bmp-28 bmp] 28 bmp Vt Set:  [410 mL] 410 mL PEEP:  [12 cmH20-14 cmH20] 12 cmH20 Plateau Pressure:  [28 cmH20] 28 cmH20   Intake/Output Summary (Last 24 hours) at 02/25/2019 0529 Last data filed at 02/24/2019 2230 Gross per 24 hour  Intake 1270.18 ml  Output 1900 ml  Net -629.82 ml   Filed Weights   02/19/2019 1221 02/23/19 1115 02/24/19 0500  Weight: 102 kg 99.8 kg 99.7 kg    Examination: General: Ill-appearing, obese, uncomfortable and in moderate respiratory distress HENT: Oropharynx dry, pupils equal, no stridor Lungs: Coarse bilaterally with diffuse inspiratory crackles louder on the left than on the right, no wheezing Cardiovascular: Regular, distant, no murmur Abdomen: Protuberant, obese, nontender, positive bowel sounds Extremities: Cool, no significant edema Neuro: Awake, conversing but confused, requires reorientation, able to move all extremities GU: Condom catheter in place  Resolved Hospital Problem list  Assessment & Plan:  Acute hypoxemic respiratory failure and ARDS due to COVID-19 pneumonia Mechanical ventilation via ARDS protocol, target PRVC 6 cc/kg, starting with 7 cc/kg 2/7, decrease as able Wean PEEP and FiO2 as able, initial settings FiO2 1.00, PEEP 14 Goal plateau pressure less than 30, driving  pressure less than 15 Paralytics if necessary for vent synchrony, gas exchange Cycle prone positioning if necessary for oxygenation later today Deep sedation per PAD protocol, goal RASS -4, currently fentanyl, Versed infusions Diuresis as blood pressure and renal function can tolerate, goal CVP 5-8.  Lasix was held on 2/6, restart and dose daily when stable to do so VAP prevention order set  COVID-19 pneumonia Remdesivir plan for 5 days Dexamethasone plan for 10 days May be a candidate for Tocilizumab or other biologic.  Consider on 2/7 Vitamin C, zinc as ordered  Right pneumothorax Right tube thoracostomy placed on 2/7 -20 cmH2O suction, air leak 2 of 7 Follow chest x-ray No evidence at this time to suggest superimposed bacterial PNA >> check respiratory cx on 2/7 and hold off on abx for now.   Hypertension with HFpEF Continue clonidine twice daily per tube Change long-acting diltiazem to short acting, decrease dose > 60 mg every 8 hours Home Lasix on hold as above  Diabetes mellitus with hyperglycemia Sliding-scale insulin resistance scale Lantus 60 units twice daily Continue linagliptin  Acute on chronic renal failure stage III (baseline S Cr documented 1.7) Follow BMP, urine output, I/O (+3.7 L total) Avoid nephrotoxins, dose medications appropriately  Hyperlipidemia Continue atorvastatin per tube  Depression Continue Paxil per tube  At risk malnutrition Nutrition consult to initiate tube feeding OG tube in place  Need for sedation to facilitate mechanical ventilation, associated encephalopathy PAD protocol, Versed and fentanyl as ordered Goal RASS -4   Best practice:  Diet: NPO, tube feeding consult Pain/Anxiety/Delirium protocol (if indicated): Ordered, fentanyl, Versed infusions VAP protocol (if indicated): In place DVT prophylaxis: Change heparin to appropriate dose enoxaparin for severity of illness GI prophylaxis: Pantoprazole started 2/7 Glucose  control: SSI resistant scale, Lantus Mobility: BR Code Status: Full Family Communication: Updated patient's wife and daughter by phone 2/7 Disposition: ICU  Labs   CBC: Recent Labs  Lab 02/25/2019 1225 02/23/19 0000 02/24/19 0208 02/24/19 2228 02/25/19 0511  WBC 4.7 3.1* 14.3*  --   --   NEUTROABS 3.3 2.4 12.7*  --   --   HGB 14.2 12.2* 13.0 12.6* 13.3  HCT 44.1 37.8* 39.0 37.0* 39.0  MCV 92.3 91.1 90.9  --   --   PLT 155 135* 166  --   --     Basic Metabolic Panel: Recent Labs  Lab 03/04/2019 1225 03/04/2019 1225 02/23/19 0000 02/24/19 0208 02/24/19 1710 02/24/19 2228 02/25/19 0511  NA 138   < > 136 140 144 147* 147*  K 3.6   < > 4.0 4.1 4.3 4.0 4.5  CL 98  --  101 103 110  --   --   CO2 27  --  23 22 20*  --   --   GLUCOSE 110*  --  313* 235* 269*  --   --   BUN 31*  --  47* 50* 48*  --   --   CREATININE 2.30*  --  2.54* 2.02* 1.89*  --   --   CALCIUM 8.9  --  8.6* 8.5* 9.4  --   --   MG  --   --   --  1.9  2.0  --   --   PHOS  --   --   --  2.8 3.5  --   --    < > = values in this interval not displayed.   GFR: Estimated Creatinine Clearance: 36 mL/min (A) (by C-G formula based on SCr of 1.89 mg/dL (H)). Recent Labs  Lab 03/09/2019 1225 02/25/2019 1226 02/20/2019 1245 02/23/19 0000 02/24/19 0208  PROCALCITON  --  <0.10  --   --   --   WBC 4.7  --   --  3.1* 14.3*  LATICACIDVEN  --   --  1.9 1.8  --     Liver Function Tests: Recent Labs  Lab 03/03/2019 1225 02/23/19 0000 02/24/19 0208  AST 78* 59* 62*  ALT 61* 50* 48*  ALKPHOS 84 72 73  BILITOT 0.8 0.8 0.3  PROT 7.0 6.3* 6.1*  ALBUMIN 3.4* 3.0* 2.8*   No results for input(s): LIPASE, AMYLASE in the last 168 hours. No results for input(s): AMMONIA in the last 168 hours.  ABG    Component Value Date/Time   PHART 7.227 (L) 02/25/2019 0511   PCO2ART 68.7 (HH) 02/25/2019 0511   PO2ART 123.0 (H) 02/25/2019 0511   HCO3 28.6 (H) 02/25/2019 0511   TCO2 31 02/25/2019 0511   ACIDBASEDEF 1.0 02/25/2019 0511     O2SAT 98.0 02/25/2019 0511     Coagulation Profile: No results for input(s): INR, PROTIME in the last 168 hours.  Cardiac Enzymes: No results for input(s): CKTOTAL, CKMB, CKMBINDEX, TROPONINI in the last 168 hours.  HbA1C: Hemoglobin A1C  Date/Time Value Ref Range Status  12/13/2018 10:20 AM 7.3 (A) 4.0 - 5.6 % Final  09/19/2018 11:15 AM 8.1 (A) 4.0 - 5.6 % Final   Hgb A1c MFr Bld  Date/Time Value Ref Range Status  02/24/2019 02:08 AM 8.5 (H) 4.8 - 5.6 % Final    Comment:    (NOTE) Pre diabetes:          5.7%-6.4% Diabetes:              >6.4% Glycemic control for   <7.0% adults with diabetes   06/15/2018 02:47 PM 8.3 (H) 4.6 - 6.5 % Final    Comment:    Glycemic Control Guidelines for People with Diabetes:Non Diabetic:  <6%Goal of Therapy: <7%Additional Action Suggested:  >8%     CBG: Recent Labs  Lab 02/23/19 1945 02/24/19 1145 02/24/19 1803 02/24/19 1952 02/24/19 2031  GLUCAP 256* 287* 253* 167* 136*    Review of Systems:   Pt unable to give  Past Medical History  He,  has a past medical history of Colon polyps (12/09/2009), Depression, Diabetes mellitus, Diabetic retinopathy associated with controlled type 2 diabetes mellitus ([place]) (06/26/2018), Diverticulosis (12/09/2009), Heart murmur, Hyperlipidemia, Hypertension, melanoma (dx'd 12/2012), Microalbuminuria, and Sleep apnea.   Surgical History    Past Surgical History:  Procedure Laterality Date  . COLONOSCOPY    . MASS EXCISION  09/29/2011   [facility]-Excision sebaceous cyst on neck  . MELANOMA EXCISION  12/2012  . POLYPECTOMY    . TONSILLECTOMY AND ADENOIDECTOMY       Social History   reports that he quit smoking about 20 years ago. His smoking use included cigars and cigarettes. He has a 67.50 pack-year smoking history. He has never used smokeless tobacco. He reports current alcohol use. He reports that he does not use drugs.   Family History   His family history includes BRCA 1/2 in his cousin and  maternal aunt; Breast cancer in his mother; Breast cancer (age of onset: 16) in his cousin; Cerebral aneurysm in his father; Heart Problems in his paternal grandfather; Heart attack in his mother; Hypertension in his father; Kidney failure in his maternal aunt; Stomach cancer in his maternal uncle. There is no history of Colon cancer, Colon polyps, Esophageal cancer, or Rectal cancer.   Allergies Allergies  Allergen Reactions  . Amlodipine Swelling  . Spironolactone     gynecomastia     Home Medications  Prior to Admission medications   Medication Sig Start Date End Date Taking? Authorizing Provider  ACCU-CHEK FASTCLIX LANCETS MISC Use to check blood sugar 2 times per day 12/01/17  Yes Renato Shin, MD  acetaZOLAMIDE (DIAMOX) 125 MG tablet Take 125 mg by mouth 2 (two) times daily.   Yes [provider]  atorvastatin (LIPITOR) 20 MG tablet TAKE 1 TABLET BY MOUTH  DAILY AT 6PM Patient taking differently: Take 20 mg by mouth daily.  09/11/18  Yes Marin Olp, MD  Blood Glucose Monitoring Suppl (ACCU-CHEK GUIDE) w/Device KIT 1 each by Does not apply route 2 (two) times daily. 11/10/17  Yes Renato Shin, MD  bumetanide (BUMEX) 2 MG tablet Take 1 tablet by mouth  daily Patient taking differently: Take 2 mg by mouth See admin instructions. Take 24m tablet by mouth in the morning and then take 221mby mouth in the evening 08/04/15  Yes HuMarin OlpMD  Cholecalciferol (VITAMIN D-3) 1000 UNITS CAPS Take 2,000 Units by mouth daily.   Yes [provider]  cloNIDine (CATAPRES) 0.1 MG tablet Take 1 tablet (0.1 mg total) by mouth 2 (two) times daily. 08/08/18  Yes HuMarin OlpMD  diltiazem (CARDIZEM CD) 360 MG 24 hr capsule TAKE 1 CAPSULE BY MOUTH  DAILY WITH BREAKFAST Patient taking differently: Take 360 mg by mouth daily.  02/05/19  Yes HuMarin OlpMD  glucose blood test strip Use to monitor glucose levels twice per day 12/01/17  Yes ElRenato ShinMD    hydrALAZINE (APRESOLINE) 100 MG tablet Take 150 mg by mouth 2 (two) times daily. Taking 1 tablet my mouth in the morning and two in the evening.   Yes [provider]  Insulin Glargine (LANTUS SOLOSTAR) 100 UNIT/ML Solostar Pen Inject 105 Units into the skin every morning. 12/13/18  Yes ElRenato ShinMD  Insulin Pen Needle 31G X 8 MM MISC Inject insulin two times a day. 09/07/17  Yes ElRenato ShinMD  labetalol (NORMODYNE) 200 MG tablet TAKE 1 TABLET BY MOUTH TWO  TIMES DAILY Patient taking differently: Take 300 mg by mouth 2 (two) times daily.  12/22/16  Yes HuMarin OlpMD  Lancet Devices (AUTOLET IMPRESSION) MISC You to check blood sugar 2 times a day 09/28/13  Yes ElRenato ShinMD  Multiple Vitamins-Minerals ([facility]ECrooksTABS Take 1 tablet by mouth daily.   Yes [provider]  Omega-3 Fatty Acids (FISH OIL) 1200 MG CAPS Take 1,200 mg by mouth daily.   Yes [provider]  PARoxetine (PAXIL) 20 MG tablet TAKE 1 TABLET BY MOUTH  DAILY Patient taking differently: Take 20 mg by mouth daily.  02/05/19  Yes HuMarin OlpMD  tadalafil (CIALIS) 20 MG tablet Take 0.5-1 tablets (10-20 mg total) by mouth every other day as needed for erectile dysfunction. 06/18/14  Yes HuMarin OlpMD  telmisartan (MICARDIS) 80 MG tablet Take 1 tablet by mouth once daily Patient taking differently: Take 80  mg by mouth daily.  09/11/18  Yes Marin Olp, MD     Critical care time: 120 minutes     Baltazar Apo, MD, PhD 02/25/2019, 5:59 AM [facility] [phone] or if no answer [phone]

## 2019-02-25 NOTE — Ethics Note (Signed)
ADVANCE CARE PLANNING NOTE  Reason for Advance Care Planning Conversation: Meeting with patient, family, and/or surrogates to address patient's wishes   Summary of Conversation: Called the patient's wife to address the patient's deterioration and increasing oxygen requirement.  On the conversation was the patient's wife and 2 daughters.  Informed them that at the beginning of the day he was on 6 LPM [place] O2, oxygen has been titrated throughout the day now he is on 60 LPM heated high flow, his oxygen saturation is staying in the 80s.  Advised that this was the maximum support that we could provide without either invasive or noninvasive ventilatory support.  Reported to them that he is confused and unable to make decisions for himself.  At this time they want everything done including but not limited to endotracheal intubation.  In addition to time spent rendering evaluation and management services for the conditions listed below, I spent 20 over-the-phone minutes with the the patient's family discussing advance care planning.   Code status order of Full Code has been entered.  This advanced care planning meeting was voluntary involving the parties above.   Chief Complaint: SOB  Active Problems:   Type 2 diabetes, controlled, with renal manifestation (HCC)   Hypertension associated with diabetes (HCC)   Chronic diastolic heart failure (HCC)   CKD (chronic kidney disease), stage III ([place])   Pneumonia due to COVID-19 virus   Acute respiratory failure with hypoxia (HCC)   AKI (acute kidney injury) ([place])   Obese   Electronically signed by Peyton Bottoms  02/25/19 / 2:21 AM

## 2019-02-25 NOTE — Progress Notes (Signed)
Patient's family updated of plans to prone patient now that he is paralyzed.  All questions answered.

## 2019-02-25 NOTE — Progress Notes (Signed)
PCCM progress note  Pt examined on rounds, labs records reviewed Dyssynchronous on vent CT to suction with 1/7 air leak  Blood pressure (!) 154/60, pulse 85, temperature 99.9 F (37.7 C), temperature source Oral, resp. rate (!) 33, height 5\' 4"  (1.626 m), weight 99.7 kg, SpO2 91 %. Gen:      No acute distress HEENT:  EOMI, sclera anicteric Neck:     No masses; no thyromegaly Lungs:    Clear to auscultation bilaterally; normal respiratory effort CV:         Regular rate and rhythm; no murmurs Abd:      + bowel sounds; soft, non-tender; no palpable masses, no distension Ext:    No edema; adequate peripheral perfusion Skin:      Warm and dry; no rash Neuro:Sedated  Assessment/Plan Severe ARDS secondary to COVID-19 Rt Ptx  Will paralyze and recheck ABG Prone if P/F ratio remains below 150 Continue CT to suction Remdesivir, decadron Start Baracitibnib.  The patient is critically ill with multiple organ system failure and requires high complexity decision making for assessment and support, frequent evaluation and titration of therapies, advanced monitoring, review of radiographic studies and interpretation of complex data.   Critical Care Time devoted to patient care services, exclusive of separately billable procedures, described in this note is 35 minutes.   Marshell Garfinkel MD [facility] Please see [email or web address] for pager details.  02/25/2019, 9:24 AM

## 2019-02-25 NOTE — Progress Notes (Signed)
Pt ETT secured with cloth tape and protective barriers at 25cm at the lip. Small blood blister to center bottom lip noted and covered with protective barrier. Pt placed into supine position with RT x2 and RN x3. Upon supination pt with large leak on ventilator. ETT remained secure at 25cm, suction catheter passed easily. RN noticed large leak with chest tube. Dr. Vaughan Browner notified and pt emergently placed back into supine position. Leak on ventilator resolved. RT will continue to monitor.

## 2019-02-25 NOTE — Progress Notes (Signed)
Pt transferred to room 164 from 139. Pt was placed on HHF at 40/100.

## 2019-02-25 NOTE — Progress Notes (Signed)
eLink Physician-Brief Progress Note Patient Name: Paul [name] DOB: 11-18-1945 MRN: [id number]   Date of Service  02/25/2019  HPI/Events of Note  Hypotension (MAP 58).  Had antihypertensives added earlier today.  Sedated but cannot remove these due to paralyzed state.   eICU Interventions  D/c'd clonidine and diltiazem orders.  Ordered peripheral levophed drip to support BP while antihypertensives that were added today (now d/c'd) can wash out.  Will not tolerate fluid bolus given COVID/ARDS.     Intervention Category Intermediate Interventions: Hypotension - evaluation and management  Charlott Rakes 02/25/2019, 8:27 PM

## 2019-02-25 NOTE — Progress Notes (Signed)
ANTICOAGULATION CONSULT NOTE - Initial Consult  Pharmacy Consult for lovenox Indication: VTE prophylaxis  Allergies  Allergen Reactions  . Amlodipine Swelling  . Spironolactone     gynecomastia    Patient Measurements: Height: 5\' 4"  (162.6 cm) Weight: 219 lb 12.8 oz (99.7 kg) IBW/kg (Calculated) : 59.2 Heparin Dosing Weight: 99.7 kg  Vital Signs: Temp: 98.5 F (36.9 C) (02/07 0415) Temp Source: Axillary (02/06 1924) BP: 145/82 (02/07 0415) Pulse Rate: 68 (02/07 0500)  Labs: Recent Labs    03/12/2019 1225 03/08/2019 1225 02/23/19 0000 02/23/19 0000 02/24/19 0208 02/24/19 0208 02/24/19 1710 02/24/19 1853 02/24/19 2102 02/24/19 2228 02/25/19 0511  HGB 14.2   < > 12.2*   < > 13.0   < >  --   --   --  12.6* 13.3  HCT 44.1   < > 37.8*   < > 39.0  --   --   --   --  37.0* 39.0  PLT 155  --  135*  --  166  --   --   --   --   --   --   CREATININE 2.30*   < > 2.54*  --  2.02*  --  1.89*  --   --   --   --   TROPONINIHS  --   --   --   --   --   --  71* 130* 194*  --   --    < > = values in this interval not displayed.    Estimated Creatinine Clearance: 36 mL/min (A) (by C-G formula based on SCr of 1.89 mg/dL (H)).   Medical History: Past Medical History:  Diagnosis Date  . Colon polyps 12/09/2009   Multiple in descending colon  . Depression   . Diabetes mellitus    type 2  . Diabetic retinopathy associated with controlled type 2 diabetes mellitus ([place]) 06/26/2018  . Diverticulosis 12/09/2009   Moderate  . Heart murmur   . Hyperlipidemia   . Hypertension   . melanoma dx'd 12/2012   rt arm; surg   . Microalbuminuria   . Sleep apnea    CPAP   Assessment: 74 yo man to start lovenox for VTE px.  He is COVID positive.  BMI 97.7, CrCl >30 ml/min Goal of Therapy:  Prevention of VTE Monitor platelets by anticoagulation protocol: Yes   Plan:  Lovenox 50 mg sq q12 hours Monitor for bleeding complications  Aadon Gorelik Poteet 02/25/2019,5:54 AM

## 2019-02-25 NOTE — Progress Notes (Signed)
eLink Physician-Brief Progress Note Patient Name: Paul [name] DOB: 1945/05/20 MRN: [id number]   Date of Service  02/25/2019  HPI/Events of Note  69M with HFpEF, CKD (baseline Cr 1.7), T2DM, HTN and OSA who was admitted 2 days ago with COVID-19 manifesting as hypoxemia and fevers at home. His respiratory status worsened since time of admission and this evening he was transferred to the ICU for hypoxemia requiring HFNC 100% + NRB on top to saturate about 86-87%.  On my exam, the patient is alert and oriented but fatigued in appearance. His breathing is labored and his respiratory rate is 36/min. His SpO2 is unchanged from above. HR 63, BP with systolics in the [id number].  CXR shows very severe bilateral opacification of the lung fields consistent with severe COVID-19.  Given his progressive respiratory failure and refractory hypoxemia we will proceed with endotracheal intubation.   eICU Interventions  # Neuro: - Propofol/fentanyl for RASS -1 to 0.  # Respiratory: - MV, ARDSNet protocol, 6cc/kg IBW (360cc) - CXR post-intubation - Continue dexamethasone 6mg  Q24H (started 2/5) - Last dose of remdesivir scheduled for today (2/7) - Keep even to net negative I/O with Lasix PRN to improve oxygenation. His baseline Cr is ~1.7.  # Cardiac: - Antihypertensives may need adjustment after intubation. Will need to reassess once we see BP on propofol.  # Endocrine: - Continue insulin regimen for patient's T2DM  # GI: - NPO for now. - Place OG tube for initiation of feeding likely later today.  # Renal: - Goal net even to negative as above.  Wife was updated regarding the change in his clinical condition prior to intubation and she was in agreement with the procedure.     Intervention Category Major Interventions: Respiratory failure - evaluation and management  Marily Lente Pacer Dorn 02/25/2019, 2:59 AM

## 2019-02-25 NOTE — Progress Notes (Signed)
PCCM interval note  Notified by nursing that patient became unstable on proning with low tidal volumes, low minute ventilation, large air leak on chest tube  Patient placed immediately supine again.  Repeat chest x-ray shows that the chest tube is likely displaced with reexpansion of pneumothorax. There is No significant tidling or air leak noted on chest tube, bedside ultrasound shows pneumothorax on the right  We placed another chest tube superior to the old chest tube with reexpansion of the lung Will hold off on proning for tonight Continue sedation, paralysis, low tidal volume ventilation  Called family and updated on events of the day  The patient is critically ill with multiple organ system failure and requires high complexity decision making for assessment and support, frequent evaluation and titration of therapies, advanced monitoring, review of radiographic studies and interpretation of complex data.   Critical Care Time devoted to patient care services, exclusive of separately billable procedures, described in this note is 35 minutes.   Marshell Garfinkel MD [facility] Please see [email or web address] for pager details.  02/25/2019, 6:05 PM

## 2019-02-25 NOTE — Progress Notes (Signed)
Critical ABG results given to Dr Lamonte Sakai.  Increased RR to 28, decreased FIO2 to 80%, Peep to 12.

## 2019-02-25 NOTE — Procedures (Signed)
Chest Tube Insertion Procedure Note  Indications:  Clinically significant Pneumothorax  Pre-operative Diagnosis: Pneumothorax  Post-operative Diagnosis: Pneumothorax  Procedure Details  Informed consent was obtained for the procedure, including sedation.  Risks of lung perforation, hemorrhage, arrhythmia, and adverse drug reaction were discussed.   After sterile skin prep, using standard technique, a 20 French tube was placed in the right lateral 5th rib space.  Findings: Good rush of air 2/7 air leak on suction -20 cmH2O  Estimated Blood Loss:  Minimal         Specimens:  None              Complications:  None; patient tolerated the procedure well.         Disposition: ICU - intubated and critically ill.         Condition: stable  Attending Attestation: I performed the procedure.  Baltazar Apo, MD, PhD 02/25/2019, 5:28 AM [facility] [phone] or if no answer [phone]

## 2019-02-25 NOTE — Progress Notes (Signed)
Notified eLinks Dr. Gillermina Phy concerning pt's borderline blood pressures with maps in the low 60s. New orders obtain. Levophed drip ordered. No other issues at this time.

## 2019-02-25 NOTE — Progress Notes (Signed)
Patient transferred to ICU for increased WOB, Placed on [place] (increased by Dr. Vanita Ingles) 100%, NRB.  CCM to consult.

## 2019-02-25 NOTE — Procedures (Signed)
Chest Tube Insertion Procedure Note  Indications:  Clinically significant Pneumothorax  Pre-operative Diagnosis: Pneumothorax  Post-operative Diagnosis: Pneumothorax  Procedure Details  Informed consent was obtained for the procedure, including sedation.  Risks of lung perforation, hemorrhage, arrhythmia, and adverse drug reaction were discussed.   After sterile skin prep, using standard technique, a 20 French tube was placed in the right lateral rib space, superior to the old chest tube. Gush of air noted during procedure with air leak on the pleurovac  Estimated Blood Loss:  Minimal         Specimens:  None              Complications:  None; patient tolerated the procedure well.         Disposition: ICU - intubated and critically ill.         Condition: stable  Marshell Garfinkel MD [facility] 02/25/2019, 6:09 PM

## 2019-02-25 NOTE — Progress Notes (Signed)
Transferred pt to ICU (206-3) due to increased O2 demands.

## 2019-02-26 ENCOUNTER — Inpatient Hospital Stay (HOSPITAL_COMMUNITY): Payer: Medicare Other

## 2019-02-26 ENCOUNTER — Other Ambulatory Visit: Payer: Medicare Other

## 2019-02-26 DIAGNOSIS — N179 Acute kidney failure, unspecified: Secondary | ICD-10-CM

## 2019-02-26 DIAGNOSIS — I5032 Chronic diastolic (congestive) heart failure: Secondary | ICD-10-CM

## 2019-02-26 LAB — CBC WITH DIFFERENTIAL/PLATELET
Abs Immature Granulocytes: 0.19 10*3/uL — ABNORMAL HIGH (ref 0.00–0.07)
Basophils Absolute: 0 10*3/uL (ref 0.0–0.1)
Basophils Relative: 0 %
Eosinophils Absolute: 0 10*3/uL (ref 0.0–0.5)
Eosinophils Relative: 0 %
HCT: 37.2 % — ABNORMAL LOW (ref 39.0–52.0)
Hemoglobin: 11.4 g/dL — ABNORMAL LOW (ref 13.0–17.0)
Immature Granulocytes: 1 %
Lymphocytes Relative: 5 %
Lymphs Abs: 0.8 10*3/uL (ref 0.7–4.0)
MCH: 29.9 pg (ref 26.0–34.0)
MCHC: 30.6 g/dL (ref 30.0–36.0)
MCV: 97.6 fL (ref 80.0–100.0)
Monocytes Absolute: 0.7 10*3/uL (ref 0.1–1.0)
Monocytes Relative: 5 %
Neutro Abs: 12.7 10*3/uL — ABNORMAL HIGH (ref 1.7–7.7)
Neutrophils Relative %: 89 %
Platelets: 174 10*3/uL (ref 150–400)
RBC: 3.81 MIL/uL — ABNORMAL LOW (ref 4.22–5.81)
RDW: 15.1 % (ref 11.5–15.5)
WBC: 14.4 10*3/uL — ABNORMAL HIGH (ref 4.0–10.5)
nRBC: 0.1 % (ref 0.0–0.2)

## 2019-02-26 LAB — COMPREHENSIVE METABOLIC PANEL
ALT: 53 U/L — ABNORMAL HIGH (ref 0–44)
AST: 48 U/L — ABNORMAL HIGH (ref 15–41)
Albumin: 2.2 g/dL — ABNORMAL LOW (ref 3.5–5.0)
Alkaline Phosphatase: 64 U/L (ref 38–126)
Anion gap: 8 (ref 5–15)
BUN: 70 mg/dL — ABNORMAL HIGH (ref 8–23)
CO2: 25 mmol/L (ref 22–32)
Calcium: 7.8 mg/dL — ABNORMAL LOW (ref 8.9–10.3)
Chloride: 115 mmol/L — ABNORMAL HIGH (ref 98–111)
Creatinine, Ser: 3.35 mg/dL — ABNORMAL HIGH (ref 0.61–1.24)
GFR calc Af Amer: 20 mL/min — ABNORMAL LOW (ref >60)
GFR calc non Af Amer: 17 mL/min — ABNORMAL LOW (ref >60)
Glucose, Bld: 134 mg/dL — ABNORMAL HIGH (ref 70–99)
Potassium: 4.7 mmol/L (ref 3.5–5.1)
Sodium: 148 mmol/L — ABNORMAL HIGH (ref 135–145)
Total Bilirubin: 0.6 mg/dL (ref 0.3–1.2)
Total Protein: 5.2 g/dL — ABNORMAL LOW (ref 6.5–8.1)

## 2019-02-26 LAB — GLUCOSE, CAPILLARY
Glucose-Capillary: 132 mg/dL — ABNORMAL HIGH (ref 70–99)
Glucose-Capillary: 129 mg/dL — ABNORMAL HIGH (ref 70–99)
Glucose-Capillary: 111 mg/dL — ABNORMAL HIGH (ref 70–99)
Glucose-Capillary: 152 mg/dL — ABNORMAL HIGH (ref 70–99)
Glucose-Capillary: 221 mg/dL — ABNORMAL HIGH (ref 70–99)
Glucose-Capillary: 188 mg/dL — ABNORMAL HIGH (ref 70–99)
Glucose-Capillary: 215 mg/dL — ABNORMAL HIGH (ref 70–99)

## 2019-02-26 LAB — D-DIMER, QUANTITATIVE: D-Dimer, Quant: 1.84 ug/mL-FEU — ABNORMAL HIGH (ref 0.00–0.50)

## 2019-02-26 LAB — MAGNESIUM: Magnesium: 2 mg/dL (ref 1.7–2.4)

## 2019-02-26 LAB — C-REACTIVE PROTEIN: CRP: 10.3 mg/dL — ABNORMAL HIGH (ref <1.0)

## 2019-02-26 LAB — PHOSPHORUS: Phosphorus: 5.1 mg/dL — ABNORMAL HIGH (ref 2.5–4.6)

## 2019-02-26 LAB — FERRITIN: Ferritin: 418 ng/mL — ABNORMAL HIGH (ref 24–336)

## 2019-02-26 MED ORDER — HEPARIN SODIUM (PORCINE) 10000 UNIT/ML IJ SOLN
10000.0000 [IU] | Freq: Three times a day (TID) | INTRAMUSCULAR | Status: DC
  Administered 2019-02-27 – 2019-03-01 (×8): 10000 [IU] via SUBCUTANEOUS
  Filled 2019-02-26 (×8): qty 1

## 2019-02-26 MED ORDER — PRO-STAT SUGAR FREE PO LIQD
30.0000 mL | Freq: Four times a day (QID) | ORAL | Status: DC
  Administered 2019-02-26 – 2019-03-01 (×13): 30 mL
  Filled 2019-02-26 (×13): qty 30

## 2019-02-26 MED ORDER — VITAL 1.5 CAL PO LIQD
1000.0000 mL | ORAL | Status: DC
  Administered 2019-02-26 – 2019-02-27 (×2): 1000 mL

## 2019-02-26 MED ORDER — METOPROLOL TARTRATE 5 MG/5ML IV SOLN
2.5000 mg | Freq: Four times a day (QID) | INTRAVENOUS | Status: DC | PRN
  Administered 2019-02-27: 2.5 mg via INTRAVENOUS
  Administered 2019-02-28 (×2): 5 mg via INTRAVENOUS
  Filled 2019-02-26 (×3): qty 5

## 2019-02-26 MED ORDER — ACETAMINOPHEN 160 MG/5ML PO SOLN
650.0000 mg | Freq: Four times a day (QID) | ORAL | Status: DC | PRN
  Administered 2019-02-26 – 2019-02-28 (×4): 650 mg
  Filled 2019-02-26 (×4): qty 20.3

## 2019-02-26 MED ORDER — FREE WATER
250.0000 mL | Freq: Four times a day (QID) | Status: DC
  Administered 2019-02-26 – 2019-03-01 (×13): 250 mL

## 2019-02-26 MED ORDER — POLYETHYLENE GLYCOL 3350 17 G PO PACK
17.0000 g | PACK | Freq: Every day | ORAL | Status: DC
  Administered 2019-02-26 – 2019-02-28 (×3): 17 g via ORAL
  Filled 2019-02-26 (×3): qty 1

## 2019-02-26 MED ORDER — ADULT MULTIVITAMIN LIQUID CH
15.0000 mL | Freq: Every day | ORAL | Status: DC
  Administered 2019-02-26 – 2019-03-01 (×4): 15 mL
  Filled 2019-02-26 (×4): qty 15

## 2019-02-26 MED ORDER — SENNOSIDES 8.8 MG/5ML PO SYRP: 5.0000 mL | ORAL_SOLUTION | Freq: Every day | ORAL | Status: DC | PRN

## 2019-02-26 MED ORDER — ADULT MULTIVITAMIN W/MINERALS CH: 1.0000 | ORAL_TABLET | Freq: Every day | ORAL | Status: DC

## 2019-02-26 MED ORDER — FUROSEMIDE 10 MG/ML IJ SOLN
40.0000 mg | Freq: Four times a day (QID) | INTRAMUSCULAR | Status: AC
  Administered 2019-02-26 (×2): 40 mg via INTRAVENOUS
  Filled 2019-02-26 (×2): qty 4

## 2019-02-26 NOTE — Progress Notes (Signed)
eLink Physician-Brief Progress Note Patient Name: KINGSON WELLES DOB: May 11, 1945 MRN: [id number]   Date of Service  02/26/2019  HPI/Events of Note  PCXR decreased PTX , no change in multifocal pna KUB no obstruction noted.    eICU Interventions  May restart TF at trickle feeds , reassess in am if able to advance to goal rate.      Intervention Category Minor Interventions: Other:  Robi Dewolfe NP-C  02/26/2019, 9:52 PM

## 2019-02-26 NOTE — Progress Notes (Signed)
eLink Physician-Brief Progress Note Patient Name: Paul [name] DOB: Dec 15, 1945 MRN: [id number]   Date of Service  02/26/2019  HPI/Events of Note  Weaned off pressors , b/p tr up .  Home HTN meds Diamox/bumex/catapress/hydralazine /labetalol/micardis .  Scr 3.35.    eICU Interventions  Add Metoprolol  As needed  For now for b/p > 160.  Reassess b/p in am and decide on home meds.      Intervention Category Minor Interventions: Other:  Rayden Scheper 02/26/2019, 10:20 PM

## 2019-02-26 NOTE — Progress Notes (Signed)
eLink Physician-Brief Progress Note Patient Name: DUANE SLEIGHT DOB: 21-Aug-1945 MRN: [id number]   Date of Service  02/26/2019  HPI/Events of Note  Bedside RN reported 500cc TF residual. Asking if she should return or hold? No complications/vomiting.  TF started today at [place] . No recent BM .    eICU Interventions  1. Hold TF for now.  2. Check ABD 1 view- if okay then can restart trickle feeds.       Intervention Category Minor Interventions: Other:  Quindon Denker 02/26/2019, 8:21 PM

## 2019-02-26 NOTE — Progress Notes (Signed)
Nutrition Follow-up  DOCUMENTATION CODES:   Obesity unspecified  INTERVENTION:   Tube Feeding:  Vital 1.5 at 40 ml/hr Pro-Stat 30 mL QID Provides 1440 kcals, 125 g of protein and 730 mL of free water  Add free water of 250 mL q 6 hours: total free water 1730 mL. Pt hypernatremic, likely need to increase free water based on sodium trend  Add MVI with [place]  NUTRITION DIAGNOSIS:   Increased nutrient needs related to catabolic illness as evidenced by estimated needs.  Progressing  GOAL:   Patient will meet greater than or equal to 90% of their needs  Progressing  MONITOR:   Vent status, Weight trends, Labs, Skin, TF tolerance  REASON FOR ASSESSMENT:   Malnutrition Screening Tool    ASSESSMENT:   74 yo male admitted with acute respiratory failure secondary to COVID-19 pneumonia, AKI on CKD III. PMH includes CKD III, DM, HTN, CHF  2/04 Admitted 2/07 Intubated, CT insertion x 35for right pneumothorax  Prone positioning as able; pt did not tolerate yesterday  Patient is currently intubated on ventilator support, fentanyl and versed for sedation, paralyzed with nimbex, requiring levophed MV: 10.6 L/min Temp (24hrs), Avg:98.9 F (37.2 C), Min:98.7 F (37.1 C), Max:99.4 F (37.4 C)  Adult TF protocol initiated yesterday. Tolerating Vital High Protein at 40 ml/hr  Labs: sodium 148 (H), Creatinine 3.35, BUN 70, CBGs 84-132, phosphorus 5.1, potassium wdl Meds:  Vitamin C, decadron, ss novolog, lantus, tradjenta, zinc sulfate  Diet Order:   Diet Order    None      EDUCATION NEEDS:   Not appropriate for education at this time  Skin:  Skin Assessment: Reviewed RN Assessment  Last BM:  no BM, abdomen soft, BS present  Height:   Ht Readings from Last 1 Encounters:  03/07/2019 5\' 4"  (1.626 m)    Weight:   Wt Readings from Last 1 Encounters:  02/26/19 100.8 kg    Ideal Body Weight:  59 kg  BMI:  Body mass index is 38.14 kg/m.  Estimated Nutritional  Needs:   Kcal:  1944 kcals (PSU), [id number] kcals (kcals/kg)  Protein:  115-130 g  Fluid:  >/= 2 L   Kerman Passey MS, RDN, LDN, CNSC RD Pager Number and Weekend/On-Call After Hours Pager Located in [place]

## 2019-02-26 NOTE — Progress Notes (Signed)
NAME:  Paul [name], MRN:  [id number], DOB:  [birth year], LOS: 4 ADMISSION DATE:  03/15/2019, CONSULTATION DATE:  2/7 REFERRING MD:  Vanita Ingles, CHIEF COMPLAINT:  Dyspnea   Brief History   74 y/o male admitted with COVID pneumonia on 2/4, intubated on 2/7, had a pneumothorax and R chest tube placed on 2/7.  Past Medical History  Depression DM2 HTN HLD Diverticulosis OSA on CPAP  Significant Hospital Events   Admission 2/4 Transfer to ICU with progressive infiltrates, hypoxia > intubated 2/7 Right pneumothorax 2/7  Consults:    Procedures:  ETT 2/7 >> Right chest tube 2/7 >>  Significant Diagnostic Tests:    Micro Data:  SARSCoV2 2/4 >> positive Influenza A & B 2/4 >> negative Blood 2/4 >>  Respiratory 2/7 >>  Antimicrobials:  Remdesivir 2/4 >> 2/8? baricitinib 2/4 >>   Interim history/subjective:   Pneumothorax bigger on CXR   Objective   Blood pressure (!) 123/57, pulse 68, temperature 98.7 F (37.1 C), temperature source Oral, resp. rate (!) 28, height 5\' 4"  (1.626 m), weight 99.7 kg, SpO2 94 %.    Vent Mode: PRVC FiO2 (%):  [70 %-100 %] 70 % Set Rate:  [28 bmp] 28 bmp Vt Set:  [410 mL] 410 mL PEEP:  [12 cmH20] 12 cmH20 Plateau Pressure:  [19 cmH20-26 cmH20] 19 cmH20   Intake/Output Summary (Last 24 hours) at 02/26/2019 0818 Last data filed at 02/26/2019 0800 Gross per 24 hour  Intake 1080.15 ml  Output 784 ml  Net 296.15 ml   Filed Weights   03/14/2019 1221 02/23/19 1115 02/24/19 0500  Weight: 102 kg 99.8 kg 99.7 kg    Examination:  General:  In bed on vent HENT: NCAT ETT in place PULM: CTA B, vent supported breathing CV: RRR, no mgr GI: BS+, soft, nontender MSK: normal bulk and tone Neuro: sedated on vent    2/8 CXR reviewed. R pneumothorax bigger  Resolved Hospital Problem list     Assessment & Plan:  ARDS due to COVID 19 pneumonia 2/8 plateau 24, PEEP 12, driving pressure 12, decreasing FiO2 Continue mechanical ventilation per ARDS  protocol Target TVol 6-8cc/kgIBW Target Plateau Pressure < 30cm H20 Target driving pressure less than 15 cm of water Target PaO2 55-65: titrate PEEP/FiO2 per protocol As long as PaO2 to FiO2 ratio is less than 1:150 position in prone position for 16 hours a day Check CVP daily if CVL in place Target CVP less than 4, diurese as necessary Ventilator associated pneumonia prevention protocol 2/8 Decrease FiO2, no prone due to chest tubes, baricitinib, add lasix  R pneumothorax > worse on CXR Increase chest tube to 40cm H20 Remove chest tube #1 (not functioning) Repeat CXR in AM  HTN, HFpEF Lasix  AKI> significantly worse 2/8 Diurese Repeat BMET later this evening Monitor BMET and UOP Replace electrolytes as needed  DM2 with hyperglycemia > well controlled Continue glargine and SSI and linagliptin  HLD statin  Depression paroxetine  At risk malnutrition Tube feeding  Need for sedation for mechanical ventilation RASS target -3 to -4, prn paralytic Fentanyl infusion, versed infusion  Best practice:  Diet: tube feeding Pain/Anxiety/Delirium protocol (if indicated): as above VAP protocol (if indicated): yes DVT prophylaxis: lovenox per pharmacy GI prophylaxis: Pantoprazole for stress ulcer prophylaxis Glucose control: as above Mobility: bed rest Code Status: DNR > confirmed with family, continue full medical support Family Communication: I called his daughter and wife today and gave them an update.  They voiced understanding. Disposition:  remain in ICU  Labs   CBC: Recent Labs  Lab 02/28/2019 1225 03/10/2019 1225 02/23/19 0000 02/23/19 0000 02/24/19 [id number] 02/24/19 [id number] 02/24/19 2228 02/25/19 0511 02/25/19 0520 02/25/19 1202 02/26/19 0530  WBC 4.7  --  3.1*  --  14.3*  --   --   --  19.5*  --  14.4*  NEUTROABS 3.3  --  2.4  --  12.7*  --   --   --  17.4*  --  12.7*  HGB 14.2   < > 12.2*   < > 13.0   < > 12.6* 13.3 13.9 12.9* 11.4*  HCT 44.1   < > 37.8*   < >  39.0   < > 37.0* 39.0 43.8 38.0* 37.2*  MCV 92.3  --  91.1  --  90.9  --   --   --  93.4  --  97.6  PLT 155  --  135*  --  166  --   --   --  221  --  174   < > = values in this interval not displayed.    Basic Metabolic Panel: Recent Labs  Lab 02/23/19 0000 02/23/19 0000 02/24/19 0208 02/24/19 0208 02/24/19 1710 02/24/19 1710 02/24/19 2228 02/25/19 0511 02/25/19 0520 02/25/19 1202 02/26/19 0530  NA 136   < > 140   < > 144   < > 147* 147* 150* 148* 148*  K 4.0   < > 4.1   < > 4.3   < > 4.0 4.5 4.7 4.7 4.7  CL 101  --  103  --  110  --   --   --  113*  --  115*  CO2 23  --  22  --  20*  --   --   --  26  --  25  GLUCOSE 313*  --  235*  --  269*  --   --   --  81  --  134*  BUN 47*  --  50*  --  48*  --   --   --  50*  --  70*  CREATININE 2.54*  --  2.02*  --  1.89*  --   --   --  1.87*  --  3.35*  CALCIUM 8.6*  --  8.5*  --  9.4  --   --   --  9.1  --  7.8*  MG  --   --  1.9  --  2.0  --   --   --  2.1  --  2.0  PHOS  --   --  2.8  --  3.5  --   --   --  4.7*  --  5.1*   < > = values in this interval not displayed.   GFR: Estimated Creatinine Clearance: 20.3 mL/min (A) (by C-G formula based on SCr of 3.35 mg/dL (H)). Recent Labs  Lab 03/17/2019 1225 02/24/2019 1226 03/15/2019 1245 02/23/19 0000 02/24/19 0208 02/25/19 0520 02/26/19 0530  PROCALCITON  --  <0.10  --   --   --   --   --   WBC   < >  --   --  3.1* 14.3* 19.5* 14.4*  LATICACIDVEN  --   --  1.9 1.8  --   --   --    < > = values in this interval not displayed.    Liver Function Tests: Recent Labs  Lab 03/08/2019 1225 02/23/19 0000 02/24/19  [id number] 02/25/19 0520 02/26/19 0530  AST 78* 59* 62* 115* 48*  ALT 61* 50* 48* 79* 53*  ALKPHOS 84 72 73 95 64  BILITOT 0.8 0.8 0.3 1.1 0.6  PROT 7.0 6.3* 6.1* 6.8 5.2*  ALBUMIN 3.4* 3.0* 2.8* 3.3* 2.2*   No results for input(s): LIPASE, AMYLASE in the last 168 hours. No results for input(s): AMMONIA in the last 168 hours.  ABG    Component Value Date/Time   PHART  7.257 (L) 02/25/2019 1202   PCO2ART 58.6 (H) 02/25/2019 1202   PO2ART 73.0 (L) 02/25/2019 1202   HCO3 26.1 02/25/2019 1202   TCO2 28 02/25/2019 1202   ACIDBASEDEF 2.0 02/25/2019 1202   O2SAT 91.0 02/25/2019 1202     Coagulation Profile: No results for input(s): INR, PROTIME in the last 168 hours.  Cardiac Enzymes: No results for input(s): CKTOTAL, CKMB, CKMBINDEX, TROPONINI in the last 168 hours.  HbA1C: Hemoglobin A1C  Date/Time Value Ref Range Status  12/13/2018 10:20 AM 7.3 (A) 4.0 - 5.6 % Final  09/19/2018 11:15 AM 8.1 (A) 4.0 - 5.6 % Final   Hgb A1c MFr Bld  Date/Time Value Ref Range Status  02/24/2019 02:08 AM 8.5 (H) 4.8 - 5.6 % Final    Comment:    (NOTE) Pre diabetes:          5.7%-6.4% Diabetes:              >6.4% Glycemic control for   <7.0% adults with diabetes   06/15/2018 02:47 PM 8.3 (H) 4.6 - 6.5 % Final    Comment:    Glycemic Control Guidelines for People with Diabetes:Non Diabetic:  <6%Goal of Therapy: <7%Additional Action Suggested:  >8%     CBG: Recent Labs  Lab 02/25/19 1119 02/25/19 1711 02/25/19 1928 02/26/19 0401 02/26/19 0731  GLUCAP 87 74 84 132* 129*     Critical care time: 40 minutes     Roselie Awkward, MD [facility] Pager: [phone] Cell: [phone] If no response, call [phone]

## 2019-02-26 NOTE — Progress Notes (Signed)
eLink Physician-Brief Progress Note Patient Name: Paul [name] DOB: Sep 18, 1945 MRN: [id number]   Date of Service  02/26/2019  HPI/Events of Note  RN called , question about chest tube site leaking , wants vaseline gauze order and pCXR .   eICU Interventions  1. Place vaseline gauze at chest tube site.  2. PCXR now.      Intervention Category Intermediate Interventions: Other:  Anthony Tamburo 02/26/2019, 8:45 PM

## 2019-02-26 NOTE — Plan of Care (Signed)
We were able to decrease his oxygen need this morning from 70% to 60%. His SpO2 is sitting between 90-95%. Secretions minimal.

## 2019-02-27 ENCOUNTER — Inpatient Hospital Stay (HOSPITAL_COMMUNITY): Payer: Medicare Other

## 2019-02-27 ENCOUNTER — Telehealth: Payer: Self-pay | Admitting: Endocrinology

## 2019-02-27 LAB — GLUCOSE, CAPILLARY
Glucose-Capillary: 108 mg/dL — ABNORMAL HIGH (ref 70–99)
Glucose-Capillary: 110 mg/dL — ABNORMAL HIGH (ref 70–99)
Glucose-Capillary: 125 mg/dL — ABNORMAL HIGH (ref 70–99)
Glucose-Capillary: 143 mg/dL — ABNORMAL HIGH (ref 70–99)
Glucose-Capillary: 154 mg/dL — ABNORMAL HIGH (ref 70–99)
Glucose-Capillary: 206 mg/dL — ABNORMAL HIGH (ref 70–99)

## 2019-02-27 LAB — MAGNESIUM: Magnesium: 2.3 mg/dL (ref 1.7–2.4)

## 2019-02-27 LAB — CBC WITH DIFFERENTIAL/PLATELET
Abs Immature Granulocytes: 0.12 10*3/uL — ABNORMAL HIGH (ref 0.00–0.07)
Basophils Absolute: 0 10*3/uL (ref 0.0–0.1)
Basophils Relative: 0 %
Eosinophils Absolute: 0 10*3/uL (ref 0.0–0.5)
Eosinophils Relative: 0 %
HCT: 43.8 % (ref 39.0–52.0)
Hemoglobin: 13.1 g/dL (ref 13.0–17.0)
Immature Granulocytes: 1 %
Lymphocytes Relative: 9 %
Lymphs Abs: 1 10*3/uL (ref 0.7–4.0)
MCH: 29.5 pg (ref 26.0–34.0)
MCHC: 29.9 g/dL — ABNORMAL LOW (ref 30.0–36.0)
MCV: 98.6 fL (ref 80.0–100.0)
Monocytes Absolute: 0.7 10*3/uL (ref 0.1–1.0)
Monocytes Relative: 7 %
Neutro Abs: 9.1 10*3/uL — ABNORMAL HIGH (ref 1.7–7.7)
Neutrophils Relative %: 83 %
Platelets: 234 10*3/uL (ref 150–400)
RBC: 4.44 MIL/uL (ref 4.22–5.81)
RDW: 14.9 % (ref 11.5–15.5)
WBC: 11 10*3/uL — ABNORMAL HIGH (ref 4.0–10.5)
nRBC: 0 % (ref 0.0–0.2)

## 2019-02-27 LAB — D-DIMER, QUANTITATIVE: D-Dimer, Quant: 2.55 ug/mL-FEU — ABNORMAL HIGH (ref 0.00–0.50)

## 2019-02-27 LAB — COMPREHENSIVE METABOLIC PANEL
ALT: 65 U/L — ABNORMAL HIGH (ref 0–44)
AST: 46 U/L — ABNORMAL HIGH (ref 15–41)
Albumin: 2.5 g/dL — ABNORMAL LOW (ref 3.5–5.0)
Alkaline Phosphatase: 86 U/L (ref 38–126)
Anion gap: 9 (ref 5–15)
BUN: 91 mg/dL — ABNORMAL HIGH (ref 8–23)
CO2: 26 mmol/L (ref 22–32)
Calcium: 8.7 mg/dL — ABNORMAL LOW (ref 8.9–10.3)
Chloride: 113 mmol/L — ABNORMAL HIGH (ref 98–111)
Creatinine, Ser: 4.14 mg/dL — ABNORMAL HIGH (ref 0.61–1.24)
GFR calc Af Amer: 15 mL/min — ABNORMAL LOW (ref >60)
GFR calc non Af Amer: 13 mL/min — ABNORMAL LOW (ref >60)
Glucose, Bld: 132 mg/dL — ABNORMAL HIGH (ref 70–99)
Potassium: 4.9 mmol/L (ref 3.5–5.1)
Sodium: 148 mmol/L — ABNORMAL HIGH (ref 135–145)
Total Bilirubin: 1 mg/dL (ref 0.3–1.2)
Total Protein: 6 g/dL — ABNORMAL LOW (ref 6.5–8.1)

## 2019-02-27 LAB — CULTURE, BLOOD (ROUTINE X 2)
Culture: NO GROWTH
Culture: NO GROWTH
Special Requests: ADEQUATE
Special Requests: ADEQUATE

## 2019-02-27 LAB — C-REACTIVE PROTEIN: CRP: 16.6 mg/dL — ABNORMAL HIGH (ref <1.0)

## 2019-02-27 LAB — PHOSPHORUS: Phosphorus: 4.8 mg/dL — ABNORMAL HIGH (ref 2.5–4.6)

## 2019-02-27 LAB — FERRITIN: Ferritin: 578 ng/mL — ABNORMAL HIGH (ref 24–336)

## 2019-02-27 MED ORDER — CEFAZOLIN SODIUM-DEXTROSE 1-4 GM/50ML-% IV SOLN
1.0000 g | Freq: Two times a day (BID) | INTRAVENOUS | Status: DC
  Administered 2019-02-27 – 2019-02-28 (×3): 1 g via INTRAVENOUS
  Filled 2019-02-27 (×4): qty 50

## 2019-02-27 MED ORDER — CISATRACURIUM BESYLATE (PF) 10 MG/5ML IV SOLN
10.0000 mg | INTRAVENOUS | Status: DC | PRN
  Filled 2019-02-27 (×2): qty 5

## 2019-02-27 MED ORDER — CLONAZEPAM 1 MG PO TABS
1.0000 mg | ORAL_TABLET | Freq: Two times a day (BID) | ORAL | Status: DC
  Administered 2019-02-27 – 2019-03-01 (×5): 1 mg
  Filled 2019-02-27 (×5): qty 1

## 2019-02-27 MED ORDER — OXYCODONE HCL 5 MG PO TABS
5.0000 mg | ORAL_TABLET | Freq: Four times a day (QID) | ORAL | Status: DC
  Administered 2019-02-27 – 2019-03-01 (×9): 5 mg
  Filled 2019-02-27 (×9): qty 1

## 2019-02-27 MED ORDER — CEFAZOLIN SODIUM-DEXTROSE 2-4 GM/100ML-% IV SOLN
2.0000 g | Freq: Once | INTRAVENOUS | Status: AC
  Administered 2019-02-27: 2 g via INTRAVENOUS
  Filled 2019-02-27: qty 100

## 2019-02-27 NOTE — Progress Notes (Signed)
Assisted tele visit to patient with wife.  Leela Vanbrocklin P, RN  

## 2019-02-27 NOTE — Telephone Encounter (Signed)
na

## 2019-02-27 NOTE — Progress Notes (Signed)
NAME:  Paul [name], MRN:  [id number], DOB:  02-Aug-1945, LOS: 5 ADMISSION DATE:  02/26/2019, CONSULTATION DATE:  2/7 REFERRING MD:  Vanita Ingles, CHIEF COMPLAINT:  Dyspnea   Brief History   74 y/o male admitted with COVID pneumonia on 2/4, intubated on 2/7, had a pneumothorax and R chest tube placed on 2/7.  Past Medical History  Depression DM2 HTN HLD Diverticulosis OSA on CPAP  Significant Hospital Events   Admission 2/4 Transfer to ICU with progressive infiltrates, hypoxia > intubated 2/7 Right pneumothorax 2/7  Consults:    Procedures:  ETT 2/7 >> Right chest tube 2/7 >>  Significant Diagnostic Tests:    Micro Data:  SARSCoV2 2/4 >> positive Influenza A & B 2/4 >> negative Blood 2/4 >>  Respiratory 2/7 >> staph aureus  Antimicrobials:  Remdesivir 2/4 >> 2/8? baricitinib 2/4 >>   Interim history/subjective:   Fever Staph aureus in resp culture Renal function worse  Objective   Blood pressure (!) 116/58, pulse 85, temperature 99.3 F (37.4 C), resp. rate (!) 28, height 5\' 4"  (1.626 m), weight 100.8 kg, SpO2 (!) 88 %.    Vent Mode: PRVC FiO2 (%):  [50 %] 50 % Set Rate:  [28 bmp] 28 bmp Vt Set:  [410 mL] 410 mL PEEP:  [12 cmH20] 12 cmH20 Plateau Pressure:  [22 cmH20-28 cmH20] 28 cmH20   Intake/Output Summary (Last 24 hours) at 02/27/2019 1045 Last data filed at 02/27/2019 0800 Gross per 24 hour  Intake 1229.74 ml  Output 2476 ml  Net -1246.26 ml   Filed Weights   02/23/19 1115 02/24/19 0500 02/26/19 1000  Weight: 99.8 kg 99.7 kg 100.8 kg    Examination:  General:  In bed on vent HENT: NCAT ETT in place PULM: CTA B, vent supported breathing CV: RRR, no mgr GI: BS+, soft, nontender MSK: normal bulk and tone Neuro: sedated on vent    2/9 CXR reviewed. r pneumothorax improved but still present, bilateral infiltrates  Resolved Hospital Problem list     Assessment & Plan:  ARDS due to COVID 19 pneumonia Continue mechanical ventilation per ARDS  protocol Target TVol 6-8cc/kgIBW Target Plateau Pressure < 30cm H20 Target driving pressure less than 15 cm of water Target PaO2 55-65: titrate PEEP/FiO2 per protocol As long as PaO2 to FiO2 ratio is less than 1:150 position in prone position for 16 hours a day Check CVP daily if CVL in place Target CVP less than 4, diurese as necessary Ventilator associated pneumonia prevention protocol 2/9 no prone with pneumothorax and chest tubes, continue to adjust vent per protocol  R pneumothorax > smaller 2/9 compared to 2/8 Continue chest tube to -40cm suction today, decrease to -20 cm 2/10  HTN, HFpEF Hold lasix with worsening renal failure  AKI> significantly worse 2/8, again 2/9 Family does not want to pursue hemodialysis Hold lasix Monitor BMET and UOP Replace electrolytes as needed   DM2 with hyperglycemia > well controlled Continue glargine, SSI, linagliptin  HLD Statin  Depression Paroxetine  At risk malnutrition Tube feding  Need for sedation for mechanical ventilation Acute metabolic encephalopathy Fentanyl infusion, versed infusion RASS target -4 to -5 prn paralytic protocol> change to intermittent   Best practice:  Diet: tube feeding Pain/Anxiety/Delirium protocol (if indicated): as above VAP protocol (if indicated): yes DVT prophylaxis: lovenox per pharmacy GI prophylaxis: Pantoprazole for stress ulcer prophylaxis Glucose control: as above Mobility: bed rest Code Status: DNR > confirmed with family, continue full medical support Family Communication: I spoke  to his family at length on 2/9 by phone, they do not want him to receive hemodialysis after we talked about his overall poor prognosis.  They want to watch his condition day by day.  I advised he is unlikely to survive but at this point they are not ready to withdraw care.   Disposition: remain in ICU  Labs   CBC: Recent Labs  Lab 02/23/19 0000 02/23/19 0000 02/24/19 0208 02/24/19 2228  02/25/19 0511 02/25/19 0520 02/25/19 1202 02/26/19 0530 02/27/19 0543  WBC 3.1*  --  14.3*  --   --  19.5*  --  14.4* 11.0*  NEUTROABS 2.4  --  12.7*  --   --  17.4*  --  12.7* 9.1*  HGB 12.2*   < > 13.0   < > 13.3 13.9 12.9* 11.4* 13.1  HCT 37.8*   < > 39.0   < > 39.0 43.8 38.0* 37.2* 43.8  MCV 91.1  --  90.9  --   --  93.4  --  97.6 98.6  PLT 135*  --  166  --   --  221  --  174 234   < > = values in this interval not displayed.    Basic Metabolic Panel: Recent Labs  Lab 02/24/19 0208 02/24/19 0208 02/24/19 1710 02/24/19 2228 02/25/19 0511 02/25/19 0520 02/25/19 1202 02/26/19 0530 02/27/19 0543  NA 140   < > 144   < > 147* 150* 148* 148* 148*  K 4.1   < > 4.3   < > 4.5 4.7 4.7 4.7 4.9  CL 103  --  110  --   --  113*  --  115* 113*  CO2 22  --  20*  --   --  26  --  25 26  GLUCOSE 235*  --  269*  --   --  81  --  134* 132*  BUN 50*  --  48*  --   --  50*  --  70* 91*  CREATININE 2.02*  --  1.89*  --   --  1.87*  --  3.35* 4.14*  CALCIUM 8.5*  --  9.4  --   --  9.1  --  7.8* 8.7*  MG 1.9  --  2.0  --   --  2.1  --  2.0 2.3  PHOS 2.8  --  3.5  --   --  4.7*  --  5.1* 4.8*   < > = values in this interval not displayed.   GFR: Estimated Creatinine Clearance: 16.5 mL/min (A) (by C-G formula based on SCr of 4.14 mg/dL (H)). Recent Labs  Lab 03/09/2019 1225 03/02/2019 1226 03/02/2019 1245 02/23/19 0000 02/23/19 0000 02/24/19 0208 02/25/19 0520 02/26/19 0530 02/27/19 0543  PROCALCITON  --  <0.10  --   --   --   --   --   --   --   WBC   < >  --   --  3.1*   < > 14.3* 19.5* 14.4* 11.0*  LATICACIDVEN  --   --  1.9 1.8  --   --   --   --   --    < > = values in this interval not displayed.    Liver Function Tests: Recent Labs  Lab 02/23/19 0000 02/24/19 0208 02/25/19 0520 02/26/19 0530 02/27/19 0543  AST 59* 62* 115* 48* 46*  ALT 50* 48* 79* 53* 65*  ALKPHOS 72 73 95 64 86  BILITOT 0.8 0.3 1.1 0.6 1.0  PROT 6.3* 6.1* 6.8 5.2* 6.0*  ALBUMIN 3.0* 2.8* 3.3* 2.2*  2.5*   No results for input(s): LIPASE, AMYLASE in the last 168 hours. No results for input(s): AMMONIA in the last 168 hours.  ABG    Component Value Date/Time   PHART 7.257 (L) 02/25/2019 1202   PCO2ART 58.6 (H) 02/25/2019 1202   PO2ART 73.0 (L) 02/25/2019 1202   HCO3 26.1 02/25/2019 1202   TCO2 28 02/25/2019 1202   ACIDBASEDEF 2.0 02/25/2019 1202   O2SAT 91.0 02/25/2019 1202     Coagulation Profile: No results for input(s): INR, PROTIME in the last 168 hours.  Cardiac Enzymes: No results for input(s): CKTOTAL, CKMB, CKMBINDEX, TROPONINI in the last 168 hours.  HbA1C: Hemoglobin A1C  Date/Time Value Ref Range Status  12/13/2018 10:20 AM 7.3 (A) 4.0 - 5.6 % Final  09/19/2018 11:15 AM 8.1 (A) 4.0 - 5.6 % Final   Hgb A1c MFr Bld  Date/Time Value Ref Range Status  02/24/2019 02:08 AM 8.5 (H) 4.8 - 5.6 % Final    Comment:    (NOTE) Pre diabetes:          5.7%-6.4% Diabetes:              >6.4% Glycemic control for   <7.0% adults with diabetes   06/15/2018 02:47 PM 8.3 (H) 4.6 - 6.5 % Final    Comment:    Glycemic Control Guidelines for People with Diabetes:Non Diabetic:  <6%Goal of Therapy: <7%Additional Action Suggested:  >8%     CBG: Recent Labs  Lab 02/26/19 1534 02/26/19 1927 02/27/19 0007 02/27/19 0410 02/27/19 0729  GLUCAP 188* 215* 206* 154* 110*     Critical care time: 40 minutes     Roselie Awkward, MD [facility] Pager: [phone] Cell: [phone] If no response, call [phone]

## 2019-02-27 NOTE — Progress Notes (Signed)
Pharmacy Antibiotic Note  Paul [name] is a 74 y.o. male admitted on 02/28/2019 with COVID-19 pneumonia.  Pharmacy has been consulted for cefazolin dosing for Staph aureus pneumonia.  MRSA PCR is negative and he has AKI with SCr 4.14, Tm 101.7  Plan:  Cefazolin 2g IV x1 now, then 1g IV q12h  Follow up renal function, culture results, and clinical course.    Height: 5\' 4"  (162.6 cm) Weight: 222 lb 3.6 oz (100.8 kg) IBW/kg (Calculated) : 59.2  Temp (24hrs), Avg:100.6 F (38.1 C), Min:99.1 F (37.3 C), Max:101.7 F (38.7 C)  Recent Labs  Lab 02/21/2019 1225 02/21/2019 1245 02/23/19 0000 02/23/19 0000 02/24/19 0208 02/24/19 1710 02/25/19 0520 02/26/19 0530 02/27/19 0543  WBC   < >  --  3.1*  --  14.3*  --  19.5* 14.4* 11.0*  CREATININE   < >  --  2.54*   < > 2.02* 1.89* 1.87* 3.35* 4.14*  LATICACIDVEN  --  1.9 1.8  --   --   --   --   --   --    < > = values in this interval not displayed.    Estimated Creatinine Clearance: 16.5 mL/min (A) (by C-G formula based on SCr of 4.14 mg/dL (H)).    Allergies  Allergen Reactions  . Amlodipine Swelling  . Spironolactone     gynecomastia    Antimicrobials this admission: 2/4 Remdesivir >>2/8 2/7 Baricitinib >> 2/8 2/9 Cefazolin >>   Dose adjustments this admission:  Microbiology results: 2/4 BCx: ngtd 2/7 MRSA PCR: neg 2/8 TA: moderate staph aureus   Thank you for allowing pharmacy to be a part of this patient's care.  Gretta Arab PharmD, BCPS Clinical pharmacist phone 7am- 5pm: [phone] 02/27/2019 9:02 AM

## 2019-02-28 ENCOUNTER — Inpatient Hospital Stay (HOSPITAL_COMMUNITY): Payer: Medicare Other

## 2019-02-28 DIAGNOSIS — N1831 Chronic kidney disease, stage 3a: Secondary | ICD-10-CM

## 2019-02-28 DIAGNOSIS — E1121 Type 2 diabetes mellitus with diabetic nephropathy: Secondary | ICD-10-CM

## 2019-02-28 DIAGNOSIS — Z9911 Dependence on respirator [ventilator] status: Secondary | ICD-10-CM

## 2019-02-28 DIAGNOSIS — E875 Hyperkalemia: Secondary | ICD-10-CM

## 2019-02-28 LAB — CBC WITH DIFFERENTIAL/PLATELET
Abs Immature Granulocytes: 0.08 10*3/uL — ABNORMAL HIGH (ref 0.00–0.07)
Basophils Absolute: 0 10*3/uL (ref 0.0–0.1)
Basophils Relative: 1 %
Eosinophils Absolute: 0 10*3/uL (ref 0.0–0.5)
Eosinophils Relative: 0 %
HCT: 42.2 % (ref 39.0–52.0)
Hemoglobin: 12.7 g/dL — ABNORMAL LOW (ref 13.0–17.0)
Immature Granulocytes: 1 %
Lymphocytes Relative: 6 %
Lymphs Abs: 0.4 10*3/uL — ABNORMAL LOW (ref 0.7–4.0)
MCH: 29.2 pg (ref 26.0–34.0)
MCHC: 30.1 g/dL (ref 30.0–36.0)
MCV: 97 fL (ref 80.0–100.0)
Monocytes Absolute: 0.4 10*3/uL (ref 0.1–1.0)
Monocytes Relative: 7 %
Neutro Abs: 5.1 10*3/uL (ref 1.7–7.7)
Neutrophils Relative %: 85 %
Platelets: 236 10*3/uL (ref 150–400)
RBC: 4.35 MIL/uL (ref 4.22–5.81)
RDW: 14.9 % (ref 11.5–15.5)
WBC: 6 10*3/uL (ref 4.0–10.5)
nRBC: 0 % (ref 0.0–0.2)

## 2019-02-28 LAB — C-REACTIVE PROTEIN: CRP: 24 mg/dL — ABNORMAL HIGH (ref <1.0)

## 2019-02-28 LAB — MAGNESIUM: Magnesium: 2.5 mg/dL — ABNORMAL HIGH (ref 1.7–2.4)

## 2019-02-28 LAB — COMPREHENSIVE METABOLIC PANEL
ALT: 52 U/L — ABNORMAL HIGH (ref 0–44)
AST: 66 U/L — ABNORMAL HIGH (ref 15–41)
Albumin: 2.2 g/dL — ABNORMAL LOW (ref 3.5–5.0)
Alkaline Phosphatase: 90 U/L (ref 38–126)
Anion gap: 11 (ref 5–15)
BUN: 136 mg/dL — ABNORMAL HIGH (ref 8–23)
CO2: 25 mmol/L (ref 22–32)
Calcium: 9.1 mg/dL (ref 8.9–10.3)
Chloride: 111 mmol/L (ref 98–111)
Creatinine, Ser: 5.85 mg/dL — ABNORMAL HIGH (ref 0.61–1.24)
GFR calc Af Amer: 10 mL/min — ABNORMAL LOW (ref >60)
GFR calc non Af Amer: 9 mL/min — ABNORMAL LOW (ref >60)
Glucose, Bld: 105 mg/dL — ABNORMAL HIGH (ref 70–99)
Potassium: 5.4 mmol/L — ABNORMAL HIGH (ref 3.5–5.1)
Sodium: 147 mmol/L — ABNORMAL HIGH (ref 135–145)
Total Bilirubin: 0.6 mg/dL (ref 0.3–1.2)
Total Protein: 6 g/dL — ABNORMAL LOW (ref 6.5–8.1)

## 2019-02-28 LAB — FERRITIN: Ferritin: 625 ng/mL — ABNORMAL HIGH (ref 24–336)

## 2019-02-28 LAB — GLUCOSE, CAPILLARY
Glucose-Capillary: 104 mg/dL — ABNORMAL HIGH (ref 70–99)
Glucose-Capillary: 105 mg/dL — ABNORMAL HIGH (ref 70–99)
Glucose-Capillary: 117 mg/dL — ABNORMAL HIGH (ref 70–99)
Glucose-Capillary: 164 mg/dL — ABNORMAL HIGH (ref 70–99)
Glucose-Capillary: 166 mg/dL — ABNORMAL HIGH (ref 70–99)
Glucose-Capillary: 252 mg/dL — ABNORMAL HIGH (ref 70–99)
Glucose-Capillary: 90 mg/dL (ref 70–99)

## 2019-02-28 LAB — PHOSPHORUS: Phosphorus: 5.2 mg/dL — ABNORMAL HIGH (ref 2.5–4.6)

## 2019-02-28 LAB — CULTURE, RESPIRATORY W GRAM STAIN

## 2019-02-28 LAB — APTT: aPTT: 32 seconds (ref 24–36)

## 2019-02-28 LAB — D-DIMER, QUANTITATIVE: D-Dimer, Quant: 3.82 ug/mL-FEU — ABNORMAL HIGH (ref 0.00–0.50)

## 2019-02-28 MED ORDER — SODIUM ZIRCONIUM CYCLOSILICATE 10 G PO PACK
10.0000 g | PACK | Freq: Two times a day (BID) | ORAL | Status: DC
  Administered 2019-02-28 – 2019-03-01 (×3): 10 g via ORAL
  Filled 2019-02-28 (×4): qty 1

## 2019-02-28 MED ORDER — AMIODARONE HCL IN DEXTROSE 360-4.14 MG/200ML-% IV SOLN
60.0000 mg/h | INTRAVENOUS | Status: AC
  Administered 2019-02-28 (×2): 60 mg/h via INTRAVENOUS
  Filled 2019-02-28: qty 200

## 2019-02-28 MED ORDER — AMIODARONE HCL IN DEXTROSE 360-4.14 MG/200ML-% IV SOLN
30.0000 mg/h | INTRAVENOUS | Status: DC
  Administered 2019-03-01 (×3): 30 mg/h via INTRAVENOUS
  Filled 2019-02-28 (×3): qty 200

## 2019-02-28 MED ORDER — AMIODARONE LOAD VIA INFUSION
150.0000 mg | Freq: Once | INTRAVENOUS | Status: AC
  Administered 2019-02-28: 150 mg via INTRAVENOUS
  Filled 2019-02-28: qty 83.34

## 2019-02-28 MED ORDER — INSULIN GLARGINE 100 UNIT/ML ~~LOC~~ SOLN
40.0000 [IU] | Freq: Two times a day (BID) | SUBCUTANEOUS | Status: DC
  Administered 2019-02-28: 40 [IU] via SUBCUTANEOUS
  Filled 2019-02-28 (×2): qty 0.4

## 2019-02-28 MED ORDER — SENNOSIDES 8.8 MG/5ML PO SYRP
5.0000 mL | ORAL_SOLUTION | Freq: Two times a day (BID) | ORAL | Status: DC
  Administered 2019-02-28 – 2019-03-01 (×3): 5 mL
  Filled 2019-02-28 (×3): qty 5

## 2019-02-28 MED ORDER — POLYETHYLENE GLYCOL 3350 17 G PO PACK
17.0000 g | PACK | Freq: Two times a day (BID) | ORAL | Status: DC
  Administered 2019-02-28 – 2019-03-01 (×2): 17 g via ORAL
  Filled 2019-02-28 (×2): qty 1

## 2019-02-28 MED FILL — Fentanyl Citrate Preservative Free (PF) Inj 2500 MCG/50ML: INTRAMUSCULAR | Qty: 50 | Status: AC

## 2019-02-28 MED FILL — Sodium Chloride IV Soln 0.9%: INTRAVENOUS | Qty: 250 | Status: AC

## 2019-02-28 NOTE — Progress Notes (Signed)
Spoke with wife regarding having Idelle Crouch come and anoint patient.  Patient is Catholic and that is her request.  She shared that a Idelle Crouch came this am to [facility] and was told he could not come in.  Shared with Mrs Kosmalski our spiritual services at [facility] and she agreed to have [facility] spiritual services anoint the patient.  She requested the chaplain reach to her and make her aware that this has occurred.  Spoke with Dr Domingo Cocking from spiritual services to advise and he will arrange for this to occur tomorrow am.

## 2019-02-28 NOTE — Plan of Care (Signed)
Now in Afib with RVR- sustained tachycardia ~150. Not responsive to IV metoprolol.  Loading with IV amiodarone.  Julian Hy, DO 02/28/19 2:47 PM [facility]

## 2019-02-28 NOTE — Progress Notes (Signed)
S/w pt's wife Paul [name] via telephone ~1015.  She expressed the family's desire to begin HD "to give his lungs more time to heal".  She stated she had changed her mind from yesterday's conversation with the MD as they had [place] [facility] and wanted more information to know what was the pt's best option; including PD.  She also asked if she needed to find a kidney specialist if they were to start HD. After Mrs. 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  My explanation seemed to help and I assured them I would ask the MD to call and if indeed they began CRRT, a nephrologist would manage that aspect of Paul [name]'s care.  Additionally during the conversation, Mrs. Jewart mentioned their priest had tried to visit today and was turned away.  She expressed understanding after I explained no outside visitors are allowed at this facility due to being a strictly [place], different from [facility], etc.      ~1515 Paul [name]'s dtr Paul [name] called for an update as they had seen in 'MyChart' notes that the pt had gone into Afib and wanted to know how he was doing and how that was being treated.  She also requested to set up an [employer].  I updated her on the Afib course of treatment and obtained a phone number for Paul [name].  While on the phone I asked if she had been a party to the aforementioned conversation this morning.  She said she heard some of it and her sister, Paul [name] participated as well.   ~1730 an [place] connection was established with the pt's dtr and spouse.  I answered questions they had during their visit.

## 2019-02-28 NOTE — Progress Notes (Signed)
All belongings, including Cell phone given to patient's daughter at [place]

## 2019-02-28 NOTE — Progress Notes (Signed)
NAME:  Paul [name], MRN:  [id number], DOB:  [birth year], LOS: 6 ADMISSION DATE:  02/20/2019, CONSULTATION DATE:  2/7 REFERRING MD:  Vanita Ingles, CHIEF COMPLAINT:  Dyspnea   Brief History   74 y/o male admitted with COVID pneumonia on 2/4, intubated on 2/7, had a pneumothorax and R chest tube placed on 2/7.  Past Medical History  Depression DM2 HTN HLD Diverticulosis OSA on CPAP  Significant Hospital Events   Admission 2/4 Transfer to ICU with progressive infiltrates, hypoxia > intubated 2/7 Right pneumothorax 2/7  Consults:    Procedures:  ETT 2/7 >> Right chest tube 2/7 >> removed 2/9 Right chest tube 2/7>> Significant Diagnostic Tests:    Micro Data:  SARSCoV2 2/4 >> positive Influenza A & B 2/4 >> negative Blood 2/4 >>  Respiratory 2/7 >> staph aureus  Antimicrobials:  Remdesivir 2/4 >> 2/8? baricitinib 2/4 >>   Interim history/subjective:   UOP improved. Back off pressors today. Tolerating TF at goal.  Objective   Blood pressure (!) 165/69, pulse (!) 101, temperature (!) 101.1 F (38.4 C), resp. rate 17, height 5\' 4"  (1.626 m), weight 100.8 kg, SpO2 91 %.    Vent Mode: PRVC FiO2 (%):  [50 %] 50 % Set Rate:  [28 bmp] 28 bmp Vt Set:  [410 mL] 410 mL PEEP:  [10 [id number] cmH20] 10 cmH20 Plateau Pressure:  [19 cmH20-28 cmH20] 21 cmH20   Intake/Output Summary (Last 24 hours) at 02/28/2019 1149 Last data filed at 02/28/2019 1030 Gross per 24 hour  Intake 1112.21 ml  Output 725 ml  Net 387.21 ml   Filed Weights   02/23/19 1115 02/24/19 0500 02/26/19 1000  Weight: 99.8 kg 99.7 kg 100.8 kg    Examination:  General: Intubated, sedated, critically ill-appearing lying in bed HENT: [place]/AT, eyes anicteric PULM: Rales bilaterally.  Periodic brisk air leak from right-sided chest tube.  Chest tube site redressed-no erythema around insertion site. CV: Regular rate and rhythm, no murmurs GI: Soft, nontender, nondistended MSK: Appropriate muscle tone for age, no  significant peripheral edema, no clubbing or cyanosis Neuro: RASS -5, PERRL    2/9 CXR personally reviewed-persistent right pneumothorax.  Diffuse bilateral infiltrates.  Resolved Hospital Problem list     Assessment & Plan:  ARDS due to COVID 19 pneumonia -Continue low tidal volume ventilation, 4-8 cc/kg ideal body weight with goal plateau less than 30 and driving pressure less than 15.  Titrate PEEP and FiO2 per ARDS Net ladder.  Permissive hypercapnia okay. -Goal net negative volume balance -VAP prevention protocol -con't NMB -Continue chest tube to suction.  Goal is to minimize mean airway pressure to facilitate closure of BPF. -No proning due to need for chest tube.  R pneumothorax > persistent Continue chest tube to suction, trial of suction to -20 cm of water.  If pneumothorax enlarges, may require increase in suction.  HTN, HFpEF.  Net positive fluid balance +2.2L  -Holding Lasix due to AKI  AKI> significantly worse 2/8, again 2/9 Family has previously not wanted to pursue hemodialysis Azotemia -Continue Foley catheter to facilitate strict I's/O -Avoid nephrotoxic meds and renally dose meds -Continue to monitor  Hyperkalemia -Lokelma twice daily -Continue to monitor  DM2 with hyperglycemia > very well controlled -Appreciate pharmacy's assistance with decrease in insulin doses -Continue linagliptin -Goal blood glucose 140-180 while admitted to the ICU  HLD -Continue statin  Depression -Continue SSRI  At risk malnutrition -Continue enteral feeds at goal  Need for sedation for mechanical ventilation Acute metabolic  encephalopathy -Continue fentanyl and Versed infusions with target goal RASS -40 -5 to facilitate vent synchrony.   Best practice:  Diet: tube feeding Pain/Anxiety/Delirium protocol (if indicated): as above VAP protocol (if indicated): yes DVT prophylaxis: lovenox per pharmacy GI prophylaxis: Pantoprazole for stress ulcer prophylaxis Glucose  control: as above Mobility: bed rest Code Status: DNR > confirmed with family, continue full medical support Family Communication: I spoke to his wife Paul [name] and multiple children over the phone to update them on his care. We will con't to monitor UOP & renal function over the next 1-2 days and con't current support. No plans for RRT. Disposition: ICU  Labs   CBC: Recent Labs  Lab 02/24/19 0208 02/24/19 2228 02/25/19 0520 02/25/19 1202 02/26/19 0530 02/27/19 0543 02/28/19 0605  WBC 14.3*  --  19.5*  --  14.4* 11.0* 6.0  NEUTROABS 12.7*  --  17.4*  --  12.7* 9.1* 5.1  HGB 13.0   < > 13.9 12.9* 11.4* 13.1 12.7*  HCT 39.0   < > 43.8 38.0* 37.2* 43.8 42.2  MCV 90.9  --  93.4  --  97.6 98.6 97.0  PLT 166  --  221  --  174 234 236   < > = values in this interval not displayed.    Basic Metabolic Panel: Recent Labs  Lab 02/24/19 1710 02/24/19 2228 02/25/19 0520 02/25/19 1202 02/26/19 0530 02/27/19 0543 02/28/19 0605  NA 144   < > 150* 148* 148* 148* 147*  K 4.3   < > 4.7 4.7 4.7 4.9 5.4*  CL 110  --  113*  --  115* 113* 111  CO2 20*  --  26  --  25 26 25   GLUCOSE 269*  --  81  --  134* 132* 105*  BUN 48*  --  50*  --  70* 91* 136*  CREATININE 1.89*  --  1.87*  --  3.35* 4.14* 5.85*  CALCIUM 9.4  --  9.1  --  7.8* 8.7* 9.1  MG 2.0  --  2.1  --  2.0 2.3 2.5*  PHOS 3.5  --  4.7*  --  5.1* 4.8* 5.2*   < > = values in this interval not displayed.   GFR: Estimated Creatinine Clearance: 11.7 mL/min (A) (by C-G formula based on SCr of 5.85 mg/dL (H)). Recent Labs  Lab 03/18/2019 1225 03/16/2019 1226 03/15/2019 1245 02/23/19 0000 02/24/19 0208 02/25/19 0520 02/26/19 0530 02/27/19 0543 02/28/19 0605  PROCALCITON  --  <0.10  --   --   --   --   --   --   --   WBC   < >  --   --  3.1*   < > 19.5* 14.4* 11.0* 6.0  LATICACIDVEN  --   --  1.9 1.8  --   --   --   --   --    < > = values in this interval not displayed.    Liver Function Tests: Recent Labs  Lab 02/24/19 0208  02/25/19 0520 02/26/19 0530 02/27/19 0543 02/28/19 0605  AST 62* 115* 48* 46* 66*  ALT 48* 79* 53* 65* 52*  ALKPHOS 73 95 64 86 90  BILITOT 0.3 1.1 0.6 1.0 0.6  PROT 6.1* 6.8 5.2* 6.0* 6.0*  ALBUMIN 2.8* 3.3* 2.2* 2.5* 2.2*   No results for input(s): LIPASE, AMYLASE in the last 168 hours. No results for input(s): AMMONIA in the last 168 hours.  ABG    Component  Value Date/Time   PHART 7.257 (L) 02/25/2019 1202   PCO2ART 58.6 (H) 02/25/2019 1202   PO2ART 73.0 (L) 02/25/2019 1202   HCO3 26.1 02/25/2019 1202   TCO2 28 02/25/2019 1202   ACIDBASEDEF 2.0 02/25/2019 1202   O2SAT 91.0 02/25/2019 1202     Coagulation Profile: No results for input(s): INR, PROTIME in the last 168 hours.  Cardiac Enzymes: No results for input(s): CKTOTAL, CKMB, CKMBINDEX, TROPONINI in the last 168 hours.  HbA1C: Hemoglobin A1C  Date/Time Value Ref Range Status  12/13/2018 10:20 AM 7.3 (A) 4.0 - 5.6 % Final  09/19/2018 11:15 AM 8.1 (A) 4.0 - 5.6 % Final   Hgb A1c MFr Bld  Date/Time Value Ref Range Status  02/24/2019 02:08 AM 8.5 (H) 4.8 - 5.6 % Final    Comment:    (NOTE) Pre diabetes:          5.7%-6.4% Diabetes:              >6.4% Glycemic control for   <7.0% adults with diabetes   06/15/2018 02:47 PM 8.3 (H) 4.6 - 6.5 % Final    Comment:    Glycemic Control Guidelines for People with Diabetes:Non Diabetic:  <6%Goal of Therapy: <7%Additional Action Suggested:  >8%     CBG: Recent Labs  Lab 02/27/19 1757 02/27/19 2106 02/28/19 0020 02/28/19 0517 02/28/19 0742  GLUCAP 143* 125* 117* 90 104*     This patient is critically ill with multiple organ system failure which requires frequent high complexity decision making, assessment, support, evaluation, and titration of therapies. This was completed through the application of advanced monitoring technologies and extensive interpretation of multiple databases. During this encounter critical care time was devoted to patient care services  described in this note for 50 minutes.

## 2019-02-28 NOTE — Procedures (Signed)
Cortrak  Person Inserting Tube:  Nikholas Geffre C, RD Tube Type:  Cortrak - 43 inches Tube Location:  Right nare Initial Placement:  Stomach Secured by: Bridle Technique Used to Measure Tube Placement:  Documented cm marking at nare/ corner of mouth Cortrak Secured At:  74 cm    Cortrak Tube Team Note:  Consult received to place a Cortrak feeding tube.   No x-ray is required. RN may begin using tube.   If the tube becomes dislodged please keep the tube and contact the Cortrak team at [email or web address] (password [email or web address]) for replacement.  If after hours and replacement cannot be delayed, place a NG tube and confirm placement with an abdominal x-ray.    Augustine Brannick P., RD, LDN, CNSC See AMiON for contact information    

## 2019-03-01 ENCOUNTER — Other Ambulatory Visit: Payer: Medicare Other

## 2019-03-01 ENCOUNTER — Inpatient Hospital Stay (HOSPITAL_COMMUNITY): Payer: Medicare Other

## 2019-03-01 DIAGNOSIS — Z7189 Other specified counseling: Secondary | ICD-10-CM

## 2019-03-01 LAB — COMPREHENSIVE METABOLIC PANEL
ALT: 39 U/L (ref 0–44)
AST: 85 U/L — ABNORMAL HIGH (ref 15–41)
Albumin: 1.8 g/dL — ABNORMAL LOW (ref 3.5–5.0)
Alkaline Phosphatase: 129 U/L — ABNORMAL HIGH (ref 38–126)
Anion gap: 12 (ref 5–15)
BUN: 165 mg/dL — ABNORMAL HIGH (ref 8–23)
CO2: 25 mmol/L (ref 22–32)
Calcium: 9 mg/dL (ref 8.9–10.3)
Chloride: 106 mmol/L (ref 98–111)
Creatinine, Ser: 6.8 mg/dL — ABNORMAL HIGH (ref 0.61–1.24)
GFR calc Af Amer: 8 mL/min — ABNORMAL LOW (ref >60)
GFR calc non Af Amer: 7 mL/min — ABNORMAL LOW (ref >60)
Glucose, Bld: 233 mg/dL — ABNORMAL HIGH (ref 70–99)
Potassium: 5.2 mmol/L — ABNORMAL HIGH (ref 3.5–5.1)
Sodium: 143 mmol/L (ref 135–145)
Total Bilirubin: 0.5 mg/dL (ref 0.3–1.2)
Total Protein: 5.4 g/dL — ABNORMAL LOW (ref 6.5–8.1)

## 2019-03-01 LAB — POCT I-STAT 7, (LYTES, BLD GAS, ICA,H+H)
Acid-base deficit: 2 mmol/L (ref 0.0–2.0)
Bicarbonate: 26.2 mmol/L (ref 20.0–28.0)
Calcium, Ion: 1.34 mmol/L (ref 1.15–1.40)
HCT: 35 % — ABNORMAL LOW (ref 39.0–52.0)
Hemoglobin: 11.9 g/dL — ABNORMAL LOW (ref 13.0–17.0)
O2 Saturation: 81 %
Patient temperature: 37.4
Potassium: 5.1 mmol/L (ref 3.5–5.1)
Sodium: 143 mmol/L (ref 135–145)
TCO2: 28 mmol/L (ref 22–32)
pCO2 arterial: 61.7 mmHg — ABNORMAL HIGH (ref 32.0–48.0)
pH, Arterial: 7.238 — ABNORMAL LOW (ref 7.350–7.450)
pO2, Arterial: 56 mmHg — ABNORMAL LOW (ref 83.0–108.0)

## 2019-03-01 LAB — GLUCOSE, CAPILLARY
Glucose-Capillary: 225 mg/dL — ABNORMAL HIGH (ref 70–99)
Glucose-Capillary: 193 mg/dL — ABNORMAL HIGH (ref 70–99)
Glucose-Capillary: 156 mg/dL — ABNORMAL HIGH (ref 70–99)
Glucose-Capillary: 182 mg/dL — ABNORMAL HIGH (ref 70–99)

## 2019-03-01 LAB — MAGNESIUM: Magnesium: 2.8 mg/dL — ABNORMAL HIGH (ref 1.7–2.4)

## 2019-03-01 LAB — APTT: aPTT: 45 seconds — ABNORMAL HIGH (ref 24–36)

## 2019-03-01 LAB — PHOSPHORUS: Phosphorus: 5.3 mg/dL — ABNORMAL HIGH (ref 2.5–4.6)

## 2019-03-01 MED ORDER — CEFAZOLIN SODIUM-DEXTROSE 1-4 GM/50ML-% IV SOLN
1.0000 g | INTRAVENOUS | Status: DC
  Filled 2019-03-01: qty 50

## 2019-03-01 MED ORDER — GLYCOPYRROLATE 1 MG PO TABS
1.0000 mg | ORAL_TABLET | ORAL | Status: DC | PRN
  Filled 2019-03-01: qty 1

## 2019-03-01 MED ORDER — FENTANYL CITRATE (PF) 100 MCG/2ML IJ SOLN: 25.0000 ug | INTRAMUSCULAR | Status: DC | PRN

## 2019-03-01 MED ORDER — DIPHENHYDRAMINE HCL 50 MG/ML IJ SOLN: 25.0000 mg | INTRAMUSCULAR | Status: DC | PRN

## 2019-03-01 MED ORDER — INSULIN GLARGINE 100 UNIT/ML ~~LOC~~ SOLN
60.0000 [IU] | Freq: Two times a day (BID) | SUBCUTANEOUS | Status: DC
  Filled 2019-03-01: qty 0.6

## 2019-03-01 MED ORDER — GLYCOPYRROLATE 0.2 MG/ML IJ SOLN: 0.2000 mg | INTRAMUSCULAR | Status: DC | PRN

## 2019-03-01 MED ORDER — MORPHINE SULFATE (PF) 2 MG/ML IV SOLN
5.0000 mg | Freq: Once | INTRAVENOUS | Status: AC
  Administered 2019-03-01: 18:00:00 5 mg via INTRAVENOUS
  Filled 2019-03-01: qty 3

## 2019-03-01 MED ORDER — IPRATROPIUM-ALBUTEROL 0.5-2.5 (3) MG/3ML IN SOLN: 3.0000 mL | Freq: Four times a day (QID) | RESPIRATORY_TRACT | Status: DC

## 2019-03-01 MED ORDER — MIDAZOLAM HCL 2 MG/2ML IJ SOLN: 2.0000 mg | INTRAMUSCULAR | Status: DC | PRN

## 2019-03-01 MED ORDER — POLYVINYL ALCOHOL 1.4 % OP SOLN
1.0000 [drp] | Freq: Four times a day (QID) | OPHTHALMIC | Status: DC | PRN
  Filled 2019-03-01: qty 15

## 2019-03-01 MED ORDER — DEXTROSE 5 % IV SOLN: INTRAVENOUS | Status: DC

## 2019-03-01 MED ORDER — CISATRACURIUM BESYLATE (PF) 10 MG/5ML IV SOLN: 10.0000 mg | INTRAVENOUS | Status: DC | PRN

## 2019-03-01 MED ORDER — CISATRACURIUM BESYLATE 20 MG/10ML IV SOLN
10.0000 mg | INTRAVENOUS | Status: DC | PRN
  Filled 2019-03-01: qty 10

## 2019-03-01 MED ORDER — INSULIN GLARGINE 100 UNIT/ML ~~LOC~~ SOLN
50.0000 [IU] | Freq: Two times a day (BID) | SUBCUTANEOUS | Status: DC
  Administered 2019-03-01: 50 [IU] via SUBCUTANEOUS
  Filled 2019-03-01 (×2): qty 0.5

## 2019-03-19 NOTE — Progress Notes (Signed)
Patient extubated for compassionate extubation.  RN and this RT at the bedside.

## 2019-03-19 NOTE — Progress Notes (Signed)
Chaplain following in order to facilitate rite of the sick with pt and family.  Attempted contact pt nurse for assessment.  Nurse unavailable at time.  Plan to contact family member and facilitate video conference with family and priest this AM.

## 2019-03-19 NOTE — Progress Notes (Signed)
RT Note:  Rate increased to 32 per ABG results (per Elink)

## 2019-03-19 NOTE — Progress Notes (Addendum)
Chaplain present to facilitate anointing / rite of the sick.   Consulted with care team, coordinated with spouse to who requests chaplain provide anointing at bedside with family on video conference.   Provided Rite of sick and anointing, prayers for commendation.  Facilitated time for family to commune with pt via video conference.  Provided support for family.       Jerene Pitch, MDiv, [facility] Lead Clinical Chaplain  [facility]

## 2019-03-19 NOTE — Progress Notes (Signed)
NAME:  Paul [name], MRN:  [id number], DOB:  1945/12/09, LOS: 7 ADMISSION DATE:  03/18/2019, CONSULTATION DATE:  2/7 REFERRING MD:  Vanita Ingles, CHIEF COMPLAINT:  Dyspnea   Brief History   74 y/o male admitted with COVID pneumonia on 2/4, intubated on 2/7, had a pneumothorax and R chest tube placed on 2/7.  Past Medical History  Depression DM2 HTN HLD Diverticulosis OSA on CPAP  Significant Hospital Events   Admission 2/4 Transfer to ICU with progressive infiltrates, hypoxia > intubated 2/7 Right pneumothorax 2/7  Consults:    Procedures:  ETT 2/7 >> Right chest tube 2/7 >> removed 2/9 Right chest tube 2/7>> Significant Diagnostic Tests:    Micro Data:  SARSCoV2 2/4 >> positive Influenza A & B 2/4 >> negative Blood 2/4 >>  Respiratory 2/7 >> staph aureus  Antimicrobials:  Remdesivir 2/4 >> 2/8? baricitinib 2/4 >>   Interim history/subjective:   Increasing oxygen requirements overnight.  Was anointed as part of receiving last rights today by the chaplain.  Objective   Blood pressure 135/71, pulse 91, temperature 99.5 F (37.5 C), resp. rate (!) 38, height 5\' 4"  (1.626 m), weight 105.1 kg, SpO2 (!) 86 %.    Vent Mode: PRVC FiO2 (%):  [70 %-100 %] 100 % Set Rate:  [28 bmp-32 bmp] 32 bmp Vt Set:  [410 mL] 410 mL PEEP:  [8 cmH20-12 cmH20] 12 cmH20 Plateau Pressure:  [17 cmH20-23 cmH20] 23 cmH20   Intake/Output Summary (Last 24 hours) at 19-Mar-2019 1557 Last data filed at Mar 19, 2019 1400 Gross per 24 hour  Intake 2281.54 ml  Output 1150 ml  Net 1131.54 ml   Filed Weights   02/24/19 0500 02/26/19 1000 03-19-19 0500  Weight: 99.7 kg 100.8 kg 105.1 kg    Examination:  General: Critically ill-appearing man lying intubated, sedated HENT: [place]/AT, eyes anicteric, mild scleral edema. PULM: Bilateral rales, some inspiratory rhonchi with minimal secretions from ETT.  Chest tube with intermittent air leak.   CV: Regular rate and rhythm, no murmurs GI: Soft,  nontender MSK: Mild lower extremity edema.  No clubbing.  Mild mottling of feet. Neuro: RASS -5, not withdrawing to pain.  PERRL.    2/11 CXR personally reviewed-persistent right pneumothorax, mild subcutaneous emphysema around chest tube on the right  Resolved Hospital Problem list     Assessment & Plan:  ARDS due to COVID 19 pneumonia-worsening with increasing oxygen requirements. -Continue low tidal volume ventilation, 4 to 8 cc/kg ideal body weight with goal plateau less than 30 and driving pressure less than 15.  -Continue chest tube to suction; given the intermittent air leak rather than continuous, I am not suspicious that changing his chest tube would remarkably improve his pneumothorax. -VAP prevention protocol -Unable to prone due to chest tube -Discussed with [name] family that his condition is worsened and he is unlikely to survive given his persistent severe disease and progression.  His family will visit this afternoon prior to withdrawal of aggressive care and focusing on comfort.  R pneumothorax > persistent -Continue chest tube to suction.  No plans to change chest tube given plan for change to comfort based care.  HTN, HFpEF.  Net positive fluid balance +2.2L  -Holding Lasix due to worsening AKI  AKI> significantly worse 2/8, again 2/9 Family has previously not wanted to pursue hemodialysis Azotemia -Continue Foley catheter for strict I's/O -Avoid nephrotoxic meds and renally dose meds  Hyperkalemia -Lokelma twice a day -No repeat labs  DM2 with hyperglycemia >  very well controlled -Will d/c Accu-Cheks and supplemental insulin once transitioning to comfort based care  HLD  Depression  At risk malnutrition -Discontinue enteral feeds once OG tube is removed  Need for sedation for mechanical ventilation Acute metabolic encephalopathy -Continue fentanyl and Versed to facilitate vent tolerance.  Unfortunately he is unlikely to tolerate any vent  dyssynchrony with the severity of his respiratory disease.  Goals of care discussion -I discussed with his wife Paul [name] and daughter that his condition is worsened as we feared it might, and they wish to pursue comfort based approach to his care at this time, which is appropriate.  Best practice:   Code Status: DNR Disposition: ICU  Labs   CBC: Recent Labs  Lab 02/24/19 0208 02/24/19 2228 02/25/19 0520 02/25/19 0520 02/25/19 1202 02/26/19 0530 02/27/19 0543 02/28/19 0605 03-24-2019 0533  WBC 14.3*  --  19.5*  --   --  14.4* 11.0* 6.0  --   NEUTROABS 12.7*  --  17.4*  --   --  12.7* 9.1* 5.1  --   HGB 13.0   < > 13.9   < > 12.9* 11.4* 13.1 12.7* 11.9*  HCT 39.0   < > 43.8   < > 38.0* 37.2* 43.8 42.2 35.0*  MCV 90.9  --  93.4  --   --  97.6 98.6 97.0  --   PLT 166  --  221  --   --  174 234 236  --    < > = values in this interval not displayed.    Basic Metabolic Panel: Recent Labs  Lab 02/25/19 0520 02/25/19 1202 02/26/19 0530 02/27/19 0543 02/28/19 0605 03-24-2019 0450 24-Mar-2019 0533  NA 150*   < > 148* 148* 147* 143 143  K 4.7   < > 4.7 4.9 5.4* 5.2* 5.1  CL 113*  --  115* 113* 111 106  --   CO2 26  --  25 26 25 25   --   GLUCOSE 81  --  134* 132* 105* 233*  --   BUN 50*  --  70* 91* 136* 165*  --   CREATININE 1.87*  --  3.35* 4.14* 5.85* 6.80*  --   CALCIUM 9.1  --  7.8* 8.7* 9.1 9.0  --   MG 2.1  --  2.0 2.3 2.5* 2.8*  --   PHOS 4.7*  --  5.1* 4.8* 5.2* 5.3*  --    < > = values in this interval not displayed.   GFR: Estimated Creatinine Clearance: 10.3 mL/min (A) (by C-G formula based on SCr of 6.8 mg/dL (H)). Recent Labs  Lab 02/23/19 0000 02/24/19 0208 02/25/19 0520 02/26/19 0530 02/27/19 0543 02/28/19 0605  WBC 3.1*   < > 19.5* 14.4* 11.0* 6.0  LATICACIDVEN 1.8  --   --   --   --   --    < > = values in this interval not displayed.    Liver Function Tests: Recent Labs  Lab 02/25/19 0520 02/26/19 0530 02/27/19 0543 02/28/19 0605 Mar 24, 2019 0450   AST 115* 48* 46* 66* 85*  ALT 79* 53* 65* 52* 39  ALKPHOS 95 64 86 90 129*  BILITOT 1.1 0.6 1.0 0.6 0.5  PROT 6.8 5.2* 6.0* 6.0* 5.4*  ALBUMIN 3.3* 2.2* 2.5* 2.2* 1.8*   No results for input(s): LIPASE, AMYLASE in the last 168 hours. No results for input(s): AMMONIA in the last 168 hours.  ABG    Component Value Date/Time   PHART  7.238 (L) 14-Mar-2019 0533   PCO2ART 61.7 (H) 03/14/2019 0533   PO2ART 56.0 (L) 2019-03-14 0533   HCO3 26.2 March 14, 2019 0533   TCO2 28 03/14/2019 0533   ACIDBASEDEF 2.0 03/14/2019 0533   O2SAT 81.0 2019-03-14 0533     Coagulation Profile: No results for input(s): INR, PROTIME in the last 168 hours.  Cardiac Enzymes: No results for input(s): CKTOTAL, CKMB, CKMBINDEX, TROPONINI in the last 168 hours.  HbA1C: Hemoglobin A1C  Date/Time Value Ref Range Status  12/13/2018 10:20 AM 7.3 (A) 4.0 - 5.6 % Final  09/19/2018 11:15 AM 8.1 (A) 4.0 - 5.6 % Final   Hgb A1c MFr Bld  Date/Time Value Ref Range Status  02/24/2019 02:08 AM 8.5 (H) 4.8 - 5.6 % Final    Comment:    (NOTE) Pre diabetes:          5.7%-6.4% Diabetes:              >6.4% Glycemic control for   <7.0% adults with diabetes   06/15/2018 02:47 PM 8.3 (H) 4.6 - 6.5 % Final    Comment:    Glycemic Control Guidelines for People with Diabetes:Non Diabetic:  <6%Goal of Therapy: <7%Additional Action Suggested:  >8%     CBG: Recent Labs  Lab 02/28/19 2335 Mar 14, 2019 0338 14-Mar-2019 0724 03-14-2019 1125 03/14/19 1506  GLUCAP 252* 225* 193* 156* 182*     This patient is critically ill with multiple organ system failure which requires frequent high complexity decision making, assessment, support, evaluation, and titration of therapies. This was completed through the application of advanced monitoring technologies and extensive interpretation of multiple databases. During this encounter critical care time was devoted to patient care services described in this note for 50 minutes.  Julian Hy,  DO March 14, 2019 4:06 PM [facility]

## 2019-03-19 NOTE — Progress Notes (Signed)
Pharmacy Antibiotic Note  Paul [name] is a 74 y.o. male admitted on 03/15/2019 with COVID-19 pneumonia.  Pharmacy has been consulted for cefazolin dosing for Staph aureus pneumonia.  MRSA PCR is negative and he has AKI.  SCr continues to increase, 6.8 with CrCl ~ 10 ml/min.  Tm 101.5  Plan:  Decrease to Cefazolin 1g IV q24h  Follow up renal function, culture results, and clinical course.   Height: 5\' 4"  (162.6 cm) Weight: 231 lb 11.3 oz (105.1 kg) IBW/kg (Calculated) : 59.2  Temp (24hrs), Avg:100.3 F (37.9 C), Min:98.8 F (37.1 C), Max:101.5 F (38.6 C)  Recent Labs  Lab 02/28/2019 1225 03/15/2019 1245 02/23/19 0000 02/23/19 0000 02/24/19 0208 02/24/19 1710 02/25/19 0520 02/26/19 0530 02/27/19 0543 02/28/19 0605 03/31/2019 0450  WBC   < >  --  3.1*   < > 14.3*  --  19.5* 14.4* 11.0* 6.0  --   CREATININE   < >  --  2.54*   < > 2.02*   < > 1.87* 3.35* 4.14* 5.85* 6.80*  LATICACIDVEN  --  1.9 1.8  --   --   --   --   --   --   --   --    < > = values in this interval not displayed.    Estimated Creatinine Clearance: 10.3 mL/min (A) (by C-G formula based on SCr of 6.8 mg/dL (H)).    Allergies  Allergen Reactions  . Amlodipine Swelling  . Spironolactone     gynecomastia    Antimicrobials this admission: 2/4 Remdesivir >>2/8 2/7 Baricitinib >> 2/8 2/9 Cefazolin >>   Dose adjustments this admission:  Microbiology results: 2/4 BCx: ngtd 2/7 MRSA PCR: neg 2/8 TA: moderate staph aureus (MSSA)  Thank you for allowing pharmacy to be a part of this patient's care.  Gretta Arab PharmD, BCPS Clinical pharmacist phone 7am- 5pm: [phone] 03-31-2019 10:24 AM

## 2019-03-19 NOTE — Death Summary Note (Signed)
DEATH SUMMARY   Patient Details  Name: Paul [name] MRN: [id number] DOB: 1946/01/04  Admission/Discharge Information   Admit Date:  03/15/2019  Date of Death: Date of Death: 2019/03/22(Simultaneous filing. User may not have seen previous data.)  Time of Death: Time of Death: 1800(Simultaneous filing. User may not have seen previous data.)  Length of Stay: 7  Referring Physician: Marin Olp, MD   Reason(s) for Hospitalization  Covid 19 viral pneumonia  Diagnoses  Preliminary cause of death:  Secondary Diagnoses (including complications and co-morbidities):  Active Problems:   Type 2 diabetes, controlled, with renal manifestation (HCC)   Hypertension associated with diabetes (HCC)   Chronic diastolic heart failure (HCC)   CKD (chronic kidney disease), stage III (HCC)   Acute respiratory failure due to COVID-19 [facility])   Acute respiratory failure with hypoxia (HCC)   AKI (acute kidney injury) ([place])   Obese   Primary spontaneous pneumothorax   Brief Hospital Course (including significant findings, care, treatment, and services provided and events leading to death)  Paul [name] is a 74 y.o. year old male who was admitted with acute hypoxic respiratory failure due to covid 19 viral pneumonia. He had progressive severe respiratory failure leading to need for MV and barotruama of the lungs causing a pneumothorax that never fully resolved despite 2 chest tubes. He developed progressive renal failure with persistently severe respiatory failure despite aggressive management. His family made the decision to withdraw aggressive care to focus on comfort. He received his last rights for anointing of the sick from the [facility] prior to palliative extubation. He passed on Mar 21, 2022 at 18:00 with his family present virtually.    Pertinent Labs and Studies  Significant Diagnostic Studies DG Abd 1 View  Result Date: 02/25/2019 CLINICAL DATA:  Nasogastric placement. EXAM: ABDOMEN - 1 VIEW  COMPARISON:  Chest radiography same time FINDINGS: Orogastric or nasogastric tube tip enters the stomach. Large right pneumothorax. On this image, the diaphragm does appear to be depressed somewhat and there could be an element of tension. IMPRESSION: Orogastric or nasogastric tube tip in the stomach. Electronically Signed   By: Nelson Chimes M.D.   On: 02/25/2019 05:15   DG Chest Portable 2 Views  Result Date: 02/25/2019 CLINICAL DATA:  RIGHT thoracostomy tube placement for pneumothorax. EXAM: CHEST  2 VIEW PORTABLE COMPARISON:  02/25/2019 FINDINGS: A RIGHT thoracostomy tube has been placed. A tiny residual RIGHT apical and basilar pneumothorax is noted. Endotracheal tube and NG tube are again identified. Diffuse bilateral airspace opacities are again identified. IMPRESSION: RIGHT thoracostomy tube placement with tiny residual RIGHT pneumothorax. Diffuse bilateral airspace opacities again noted. Electronically Signed   By: Margarette Canada M.D.   On: 02/25/2019 06:39   DG CHEST PORT 1 VIEW  Result Date: 03-22-19 CLINICAL DATA:  Chest tube in place. EXAM: PORTABLE CHEST 1 VIEW COMPARISON:  February 28, 2019. FINDINGS: Stable cardiomediastinal silhouette. Endotracheal and feeding tubes are unchanged in position. Right-sided chest tube is again noted, although it appears to be partially withdrawn compared to prior exam. Side hole appears to be within the thoracic space. Significantly increased moderate right pneumothorax is noted. Stable diffuse bilateral lung opacities are noted concerning for pneumonia. Slightly increased subcutaneous emphysema is seen over right lateral chest wall. Bony thorax is unremarkable. IMPRESSION: Right-sided chest tube is again noted, although it appears to be partially withdrawn compared to prior exam. Side hole appears to be within the thoracic space. Significantly increased moderate right pneumothorax is noted. Stable bilateral  diffuse lung opacities are noted concerning for  multifocal pneumonia. Electronically Signed   By: Marijo Conception M.D.   On: 03-25-19 10:32   DG Chest Port 1 View  Result Date: 02/28/2019 CLINICAL DATA:  Acute respiratory failure with hypoxemia, history CHF, type II diabetes mellitus, hypertension EXAM: PORTABLE CHEST 1 VIEW COMPARISON:  Portable exam 0541 hours compared to 02/27/2019 FINDINGS: Tip of endotracheal tube projects 4.0 cm above carina. Nasogastric tube extends into stomach. RIGHT thoracostomy tube unchanged. Stable heart size and mediastinal contours. Atherosclerotic calcification aorta. Diffuse BILATERAL pulmonary infiltrates again seen. Persistent RIGHT pneumothorax despite thoracostomy tube. No mediastinal shift or pleural effusion identified. IMPRESSION: Persistent RIGHT pneumothorax despite thoracostomy tube. Persistent BILATERAL pulmonary infiltrates, slightly increased in LEFT upper lobe since previous exam. Electronically Signed   By: Lavonia Dana M.D.   On: 02/28/2019 09:07   DG Chest Port 1 View  Result Date: 02/27/2019 CLINICAL DATA:  ARDS due to COVID-19, history hypertension, type II diabetes mellitus EXAM: PORTABLE CHEST 1 VIEW COMPARISON:  Portable exam 0542 hours compared to 02/26/2019 FINDINGS: Tip of endotracheal tube projects 4.7 cm above carina. Nasogastric tube extends into stomach. RIGHT thoracostomy tube unchanged. Rotated to the LEFT. BILATERAL pulmonary infiltrates which could represent multifocal pneumonia, pulmonary edema, or ARDS. Small LEFT pleural effusion. Persistent RIGHT pneumothorax despite thoracostomy tube. IMPRESSION: Persistent pulmonary infiltrates. Persistent small RIGHT pneumothorax despite RIGHT thoracostomy tube. Small LEFT pleural effusion. Electronically Signed   By: Lavonia Dana M.D.   On: 02/27/2019 09:27   DG Chest Port 1 View  Result Date: 02/26/2019 CLINICAL DATA:  Right-sided pneumothorax, respiratory failure, intubated EXAM: PORTABLE CHEST 1 VIEW COMPARISON:  02/26/2019 at 8:25 a.m.  FINDINGS: Single frontal view of the chest demonstrates stable endotracheal tube and enteric catheter. There is 1 residual right-sided chest 2, tip and side port projecting over the right midlung zone. A right-sided pneumothorax seen previously has decreased in size, now estimated less than 10-15%. Widespread interstitial and ground-glass opacities throughout the lungs are again noted and unchanged. No left-sided pneumothorax. IMPRESSION: 1. Interval decrease in size of right-sided pneumothorax, now estimated less than 10-15%. 2. Stable widespread interstitial and ground-glass opacities consistent with multifocal pneumonia. Electronically Signed   By: Randa Ngo M.D.   On: 02/26/2019 21:25   DG CHEST PORT 1 VIEW  Result Date: 02/26/2019 CLINICAL DATA:  ARDS. EXAM: PORTABLE CHEST 1 VIEW COMPARISON:  February 25, 2019. FINDINGS: Stable cardiomegaly. Endotracheal and nasogastric tubes are unchanged. Two right-sided chest tubes are again noted. There is interval development of moderate right apical pneumothorax. Stable bilateral lung opacities are noted concerning for multifocal pneumonia. Bony thorax is unremarkable. IMPRESSION: Stable support apparatus. Interval development of moderate right apical pneumothorax with continued presence of 2 right-sided chest tubes. Stable bilateral lung opacities are noted concerning for multifocal pneumonia. Electronically Signed   By: Marijo Conception M.D.   On: 02/26/2019 09:02   DG CHEST PORT 1 VIEW  Result Date: 02/25/2019 CLINICAL DATA:  Pneumothorax.  Chest tube placement. EXAM: PORTABLE CHEST 1 VIEW COMPARISON:  Earlier same day FINDINGS: 4:14 p.m. Endotracheal tube tip is 4 cm above the base of the carina. The NG tube passes into the stomach although the distal tip position is not included on the film. The cardiopericardial silhouette is within normal limits for size. Sharp left heart border without pleural line visible in the left hemithorax suggest pneumothorax. 2  right-sided chest tubes remain in place with apparent interval repositioning. There may be a trace  residual pneumothorax on the right. Diffuse bilateral airspace disease again noted. The visualized bony structures of the thorax are intact. Telemetry leads overlie the chest. IMPRESSION: 1. Interval re-expansion of the right lung with possible but not definite trace residual pneumothorax. 2. Similar appearance of the patchy bilateral airspace disease. Electronically Signed   By: Misty Stanley M.D.   On: 02/25/2019 16:36   DG CHEST PORT 1 VIEW  Result Date: 02/25/2019 CLINICAL DATA:  Chest tube in place. EXAM: PORTABLE CHEST 1 VIEW COMPARISON:  Same day. FINDINGS: Stable cardiomediastinal silhouette. There is continued presence of right-sided chest tube, although moderate right apical pneumothorax is now noted which is significantly enlarged compared to prior exam. Endotracheal and nasogastric tubes are unchanged in position. Stable bilateral lung opacities are noted most consistent with multifocal pneumonia. Bony thorax is unremarkable. IMPRESSION: Right-sided chest tube is again noted, although moderate right apical pneumothorax is now noted which is significantly enlarged compared to prior exam. Stable bilateral lung opacities are noted most consistent with multifocal pneumonia. Electronically Signed   By: Marijo Conception M.D.   On: 02/25/2019 15:00   DG CHEST PORT 1 VIEW  Result Date: 02/25/2019 CLINICAL DATA:  Intubated.  Coronavirus infection. EXAM: PORTABLE CHEST 1 VIEW COMPARISON:  02/24/2019 FINDINGS: Endotracheal tube tip 3 cm above the carina. Orogastric or nasogastric tube enters the abdomen. Large right pneumothorax, 50-70%. No evidence of tension/mediastinal shift. Widespread bilateral pulmonary infiltrates persist. IMPRESSION: 50-70% right pneumothorax. Endotracheal tube well positioned. Call report in progress. Electronically Signed   By: Nelson Chimes M.D.   On: 02/25/2019 05:13   DG CHEST PORT  1 VIEW  Result Date: 02/24/2019 CLINICAL DATA:  Shortness of breath. EXAM: PORTABLE CHEST 1 VIEW COMPARISON:  February 24, 2019 FINDINGS: Moderate to marked severity infiltrates are seen involving predominantly the bilateral upper lobes and, to a lesser degree, the bilateral lung bases. This is increased in severity when compared to the prior study. There is no evidence of a pleural effusion or pneumothorax. The cardiac silhouette is moderately enlarged and unchanged in size. The visualized skeletal structures are unremarkable. IMPRESSION: Moderate to marked severity bilateral infiltrates, increased in severity when compared to the prior study dated February 24, 2019 (acquired at 11:47 a.m.). Electronically Signed   By: Virgina Norfolk M.D.   On: 02/24/2019 17:15   DG CHEST PORT 1 VIEW  Result Date: 02/24/2019 CLINICAL DATA:  Shortness of breath. EXAM: PORTABLE CHEST 1 VIEW COMPARISON:  02/28/2019 FINDINGS: 11:47 a.m. low volume film. The cardio pericardial silhouette is enlarged. Interstitial markings are diffusely coarsened with chronic features. Patchy bilateral ground-glass opacities are noted bilaterally, left greater than right, progressive in the interval. The visualized bony structures of the thorax are intact. Telemetry leads overlie the chest. IMPRESSION: Interval progression of patchy bilateral ground-glass opacities consistent with multifocal pneumonia. Electronically Signed   By: Misty Stanley M.D.   On: 02/24/2019 12:14   DG Chest Port 1 View  Result Date: 02/28/2019 CLINICAL DATA:  COVID. EXAM: PORTABLE CHEST 1 VIEW COMPARISON:  No prior. FINDINGS: Borderline cardiomegaly. No pulmonary venous congestion. Bilateral diffuse interstitial prominence noted. Interstitial edema and/or pneumonitis could present this fashion. Tiny bilateral pleural effusions cannot be excluded. No pneumothorax. IMPRESSION: 1.  Borderline cardiomegaly. 2. Bilateral diffuse interstitial prominence noted. Interstitial  edema and/or pneumonitis could present this fashion. Tiny bilateral pleural effusions cannot be excluded. Electronically Signed   By: Marcello Moores  Register   On: 03/07/2019 12:54   DG Abd Portable 1V  Result  Date: 02/26/2019 CLINICAL DATA:  Constipation, feeding tube residuals EXAM: PORTABLE ABDOMEN - 1 VIEW COMPARISON:  02/25/2019 FINDINGS: Frontal view of the abdomen and pelvis was performed, excluding the bilateral flanks and hemidiaphragms by collimation. Intera catheter projects over gastric body. The bowel gas pattern is unremarkable without obstruction or ileus. No masses or abnormal calcifications. IMPRESSION: 1. Unremarkable bowel gas pattern. Electronically Signed   By: Randa Ngo M.D.   On: 02/26/2019 21:03    Microbiology Recent Results (from the past 240 hour(s))  Blood Culture (routine x 2)     Status: None   Collection Time: 02/20/2019 12:15 PM   Specimen: BLOOD  Result Value Ref Range Status   Specimen Description BLOOD LEFT ANTECUBITAL  Final   Special Requests   Final    BOTTLES DRAWN AEROBIC AND ANAEROBIC Blood Culture adequate volume   Culture   Final    NO GROWTH 5 DAYS Performed at [facility], [address], [place], [place] [address]    Report Status 02/27/2019 FINAL  Final  Respiratory Panel by RT PCR (Flu A&B, Covid) - Nasopharyngeal Swab     Status: Abnormal   Collection Time: 02/26/2019 12:26 PM   Specimen: Nasopharyngeal Swab  Result Value Ref Range Status   SARS Coronavirus 2 by RT PCR POSITIVE (A) NEGATIVE Final    Comment: RESULT CALLED TO, READ BACK BY AND VERIFIED WITH: Bennie Hind RN 15:10 03/07/2019 ([email or web address]) (NOTE) SARS-CoV-2 target nucleic acids are DETECTED. SARS-CoV-2 RNA is generally detectable in upper respiratory specimens  during the acute phase of infection. Positive results are indicative of the presence of the identified virus, but do not rule out bacterial infection or co-infection with other pathogens not detected by the test. Clinical  correlation with patient history and other diagnostic information is necessary to determine patient infection status. The expected result is Negative. Fact Sheet for Patients:  PinkCheek.be Fact Sheet for Healthcare Providers: GravelBags.it This test is not yet approved or cleared by the [place] FDA and  has been authorized for detection and/or diagnosis of SARS-CoV-2 by FDA under an Emergency Use Authorization (EUA).  This EUA will remain in effect (meaning this test can be used)  for the duration of  the COVID-19 declaration under Section 564(b)(1) of the Act, 21 U.S.C. section 360bbb-3(b)(1), unless the authorization is terminated or revoked sooner.    Influenza A by PCR NEGATIVE NEGATIVE Final   Influenza B by PCR NEGATIVE NEGATIVE Final    Comment: (NOTE) The Xpert Xpress SARS-CoV-2/FLU/RSV assay is intended as an aid in  the diagnosis of influenza from Nasopharyngeal swab specimens and  should not be used as a sole basis for treatment. Nasal washings and  aspirates are unacceptable for Xpert Xpress SARS-CoV-2/FLU/RSV  testing. Fact Sheet for Patients: PinkCheek.be Fact Sheet for Healthcare Providers: GravelBags.it This test is not yet approved or cleared by the [place] FDA and  has been authorized for detection and/or diagnosis of SARS-CoV-2 by  FDA under an Emergency Use Authorization (EUA). This EUA will remain  in effect (meaning this test can be used) for the duration of the  Covid-19 declaration under Section 564(b)(1) of the Act, 21  U.S.C. section 360bbb-3(b)(1), unless the authorization is  terminated or revoked. Performed at [facility], [place] [address] [place], [place] [address]   Blood Culture (routine x 2)     Status: None   Collection Time: 02/19/2019 12:50 PM   Specimen: BLOOD RIGHT ARM  Result Value Ref  Range Status   Specimen  Description BLOOD RIGHT ARM  Final   Special Requests   Final    BOTTLES DRAWN AEROBIC AND ANAEROBIC Blood Culture adequate volume   Culture   Final    NO GROWTH 5 DAYS Performed at [facility], [address], [place], [place] [address]    Report Status 02/27/2019 FINAL  Final  MRSA PCR Screening     Status: None   Collection Time: 02/25/19  3:00 AM   Specimen: Nasopharyngeal  Result Value Ref Range Status   MRSA by PCR NEGATIVE NEGATIVE Final    Comment:        The GeneXpert MRSA Assay (FDA approved for NASAL specimens only), is one component of a comprehensive MRSA colonization surveillance program. It is not intended to diagnose MRSA infection nor to guide or monitor treatment for MRSA infections. Performed at [facility], [place] [address], [place], [place] [address]   Culture, respiratory (non-expectorated)     Status: None   Collection Time: 02/26/19  6:03 AM   Specimen: Tracheal Aspirate; Respiratory  Result Value Ref Range Status   Specimen Description   Final    TRACHEAL ASPIRATE Performed at [facility] [address] [place], [place] [address]    Special Requests   Final    NONE Performed at [facility], [place] [address], [place], [place] [address]    Gram Stain   Final    RARE WBC PRESENT, PREDOMINANTLY PMN MODERATE GRAM POSITIVE COCCI IN PAIRS IN CLUSTERS Performed at [facility], [place] [address] [place], [place] [address]    Culture MODERATE STAPHYLOCOCCUS AUREUS  Final   Report Status 02/28/2019 FINAL  Final   Organism ID, Bacteria STAPHYLOCOCCUS AUREUS  Final      Susceptibility   Staphylococcus aureus - MIC*    CIPROFLOXACIN <=0.5 SENSITIVE Sensitive     ERYTHROMYCIN >=8 RESISTANT Resistant     GENTAMICIN <=0.5 SENSITIVE Sensitive     OXACILLIN 0.5 SENSITIVE Sensitive     TETRACYCLINE <=1 SENSITIVE Sensitive     VANCOMYCIN <=0.5 SENSITIVE Sensitive     TRIMETH/SULFA <=10  SENSITIVE Sensitive     CLINDAMYCIN RESISTANT Resistant     RIFAMPIN <=0.5 SENSITIVE Sensitive     Inducible Clindamycin POSITIVE Resistant     * MODERATE STAPHYLOCOCCUS AUREUS    Lab Basic Metabolic Panel: Recent Labs  Lab 02/25/19 0520 02/25/19 1202 02/26/19 0530 02/27/19 0543 02/28/19 0605 03/19/2019 0450 19-Mar-2019 0533  NA 150*   < > 148* 148* 147* 143 143  K 4.7   < > 4.7 4.9 5.4* 5.2* 5.1  CL 113*  --  115* 113* 111 106  --   CO2 26  --  25 26 25 25   --   GLUCOSE 81  --  134* 132* 105* 233*  --   BUN 50*  --  70* 91* 136* 165*  --   CREATININE 1.87*  --  3.35* 4.14* 5.85* 6.80*  --   CALCIUM 9.1  --  7.8* 8.7* 9.1 9.0  --   MG 2.1  --  2.0 2.3 2.5* 2.8*  --   PHOS 4.7*  --  5.1* 4.8* 5.2* 5.3*  --    < > = values in this interval not displayed.   Liver Function Tests: Recent Labs  Lab 02/25/19 0520 02/26/19 0530 02/27/19 0543 02/28/19 0605 03/19/2019 0450  AST 115* 48* 46* 66* 85*  ALT 79* 53*  65* 52* 39  ALKPHOS 95 64 86 90 129*  BILITOT 1.1 0.6 1.0 0.6 0.5  PROT 6.8 5.2* 6.0* 6.0* 5.4*  ALBUMIN 3.3* 2.2* 2.5* 2.2* 1.8*   No results for input(s): LIPASE, AMYLASE in the last 168 hours. No results for input(s): AMMONIA in the last 168 hours. CBC: Recent Labs  Lab 02/24/19 0208 02/24/19 2228 02/25/19 0520 02/25/19 0520 02/25/19 1202 02/26/19 0530 02/27/19 0543 02/28/19 0605 2019-03-06 0533  WBC 14.3*  --  19.5*  --   --  14.4* 11.0* 6.0  --   NEUTROABS 12.7*  --  17.4*  --   --  12.7* 9.1* 5.1  --   HGB 13.0   < > 13.9   < > 12.9* 11.4* 13.1 12.7* 11.9*  HCT 39.0   < > 43.8   < > 38.0* 37.2* 43.8 42.2 35.0*  MCV 90.9  --  93.4  --   --  97.6 98.6 97.0  --   PLT 166  --  221  --   --  174 234 236  --    < > = values in this interval not displayed.   Cardiac Enzymes: No results for input(s): CKTOTAL, CKMB, CKMBINDEX, TROPONINI in the last 168 hours. Sepsis Labs: Recent Labs  Lab 02/25/19 0520 02/26/19 0530 02/27/19 0543 02/28/19 0605  WBC 19.5*  14.4* 11.0* 6.0    Procedures/Operations  Chest tube on the right x 2    Julian Hy 03/02/2019, 5:38 PM

## 2019-03-19 NOTE — Progress Notes (Signed)
Patient transported for family visit and back to the ICU.  Patient transported on current vent settings.  No complications noted.

## 2019-03-19 NOTE — Progress Notes (Addendum)
   02-Mar-2019 1800  Note  Observations Patient without RR and without pulse. 2nd RN and this writing RN pronounced cardiac TOD 1800. Family informed via [place]. Questions answered appropriately. Team at bedside with deep condolenscenes for [facility] family. Prior to elink termination, discussed with daughter to call nursing station to give instruction for arrangements once family has a few moments together to grieve   Per order, RN x2 pronounce carried out by policy and procedure    Marcie Bal RN

## 2019-03-19 DEATH — deceased

## 2019-03-21 ENCOUNTER — Ambulatory Visit: Payer: Medicare Other | Admitting: Endocrinology

## 2019-04-01 ENCOUNTER — Other Ambulatory Visit: Payer: Self-pay | Admitting: Family Medicine

## 2021-09-23 IMAGING — DX DG CHEST 1V PORT
1 series · 1 of 1 positions shown · non-contrast
Comparison: Portable exam 4191 hours compared to 02/26/2019

CLINICAL DATA: ARDS due to [id number], history hypertension, type II
diabetes mellitus

EXAM:
PORTABLE CHEST 1 VIEW

[chest ap]
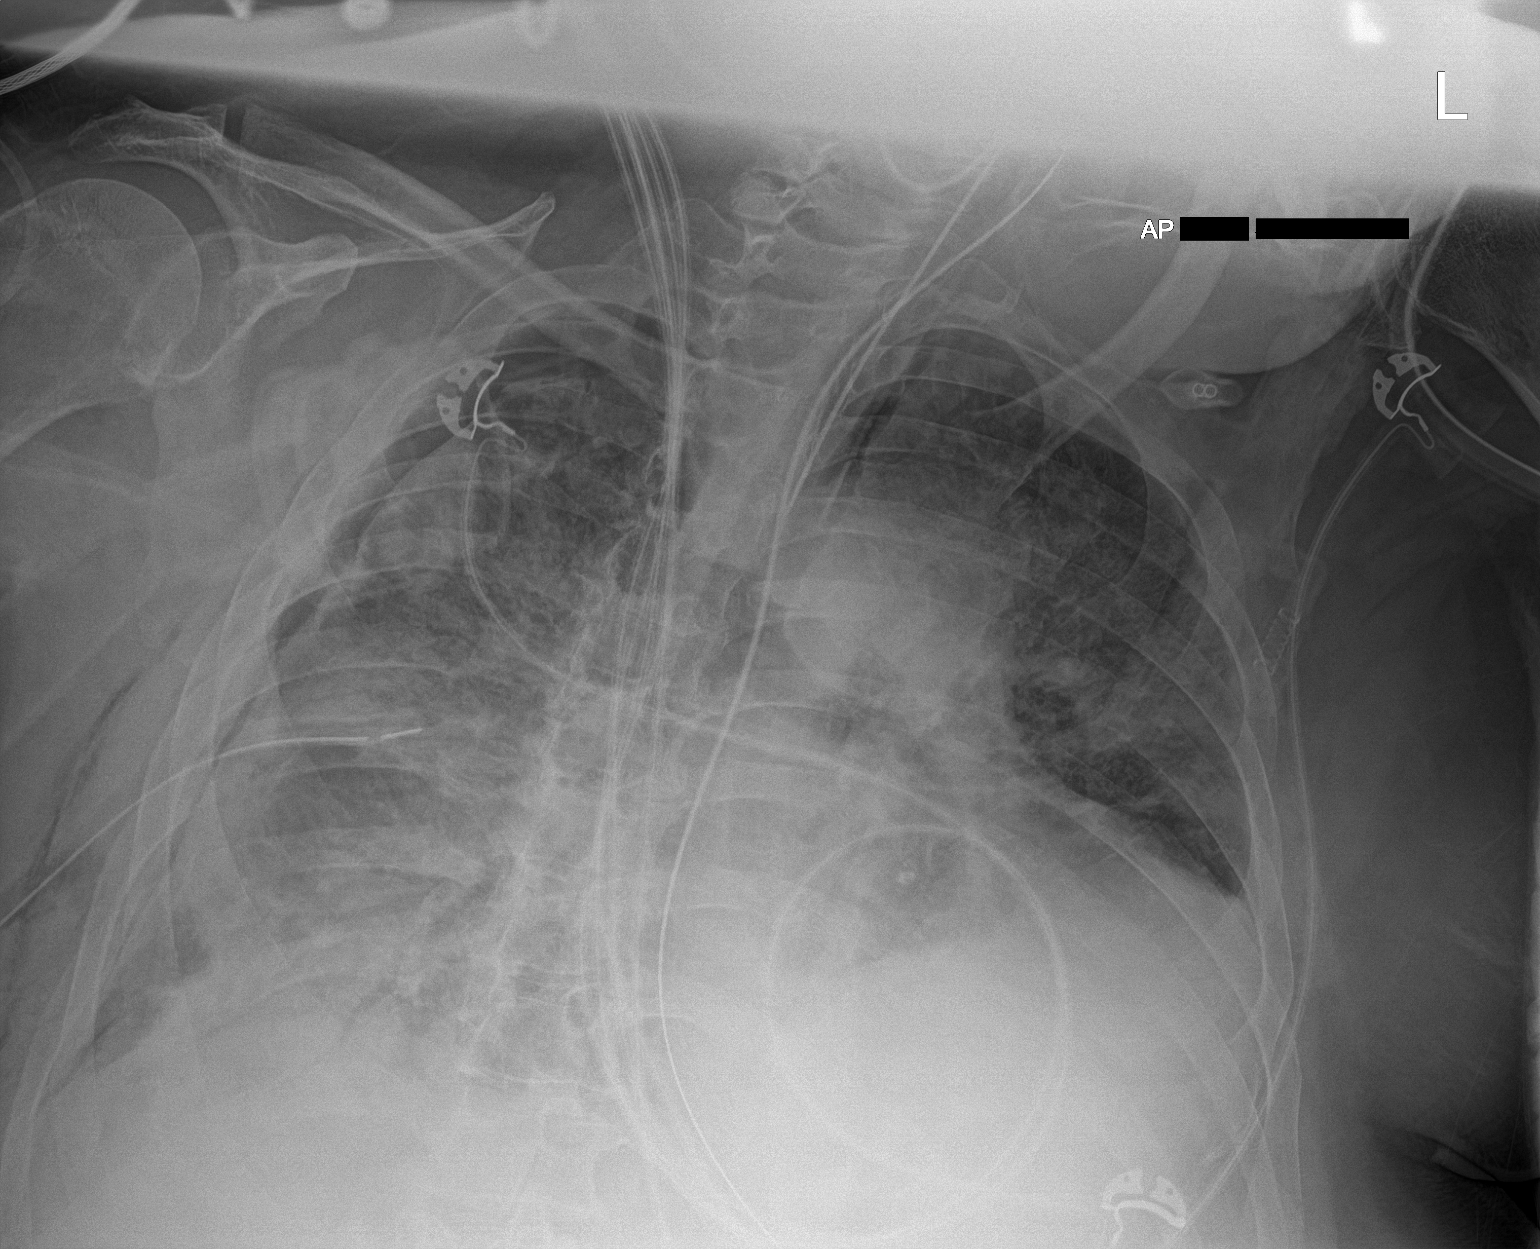

[1 of 1 positions shown; findings below may reference images not displayed]

FINDINGS: Tip of endotracheal tube projects 4.7 cm above carina.

Nasogastric tube extends into stomach.

RIGHT thoracostomy tube unchanged.

Rotated to the LEFT.

BILATERAL pulmonary infiltrates which could represent multifocal
pneumonia, pulmonary edema, or ARDS.

Small LEFT pleural effusion.

Persistent RIGHT pneumothorax despite thoracostomy tube.
IMPRESSION: Persistent pulmonary infiltrates.

Persistent small RIGHT pneumothorax despite RIGHT thoracostomy tube.

Small LEFT pleural effusion.
